# Patient Record
Sex: Male | Born: 1960
Health system: Southern US, Community
[De-identification: ages and names within clinical notes are randomized; demographics above are authoritative.]

## PROBLEM LIST (undated history)

## (undated) DIAGNOSIS — F329 Major depressive disorder, single episode, unspecified: Secondary | ICD-10-CM

## (undated) DIAGNOSIS — K219 Gastro-esophageal reflux disease without esophagitis: Secondary | ICD-10-CM

## (undated) DIAGNOSIS — I4819 Other persistent atrial fibrillation: Secondary | ICD-10-CM

## (undated) DIAGNOSIS — G4733 Obstructive sleep apnea (adult) (pediatric): Secondary | ICD-10-CM

## (undated) DIAGNOSIS — M1990 Unspecified osteoarthritis, unspecified site: Secondary | ICD-10-CM

## (undated) DIAGNOSIS — M1710 Unilateral primary osteoarthritis, unspecified knee: Secondary | ICD-10-CM

## (undated) DIAGNOSIS — F32A Depression, unspecified: Secondary | ICD-10-CM

## (undated) DIAGNOSIS — I440 Atrioventricular block, first degree: Secondary | ICD-10-CM

## (undated) DIAGNOSIS — M25561 Pain in right knee: Secondary | ICD-10-CM

## (undated) DIAGNOSIS — M179 Osteoarthritis of knee, unspecified: Secondary | ICD-10-CM

## (undated) DIAGNOSIS — M25562 Pain in left knee: Secondary | ICD-10-CM

## (undated) DIAGNOSIS — I10 Essential (primary) hypertension: Secondary | ICD-10-CM

## (undated) HISTORY — DX: Unspecified osteoarthritis, unspecified site: M19.90

## (undated) HISTORY — PX: TONSILLECTOMY: SUR1361

## (undated) HISTORY — DX: Other persistent atrial fibrillation: I48.19

## (undated) HISTORY — PX: CARDIOVASCULAR STRESS TEST: SHX262

## (undated) HISTORY — DX: Major depressive disorder, single episode, unspecified: F32.9

## (undated) HISTORY — PX: APPENDECTOMY: SHX54

## (undated) HISTORY — DX: Depression, unspecified: F32.A

## (undated) HISTORY — DX: Obstructive sleep apnea (adult) (pediatric): G47.33

## (undated) HISTORY — PX: OTHER SURGICAL HISTORY: SHX169

---

## 1999-05-12 ENCOUNTER — Other Ambulatory Visit: Admission: RE | Admit: 1999-05-12 | Discharge: 1999-05-12 | Payer: Self-pay | Admitting: Gastroenterology

## 1999-05-12 ENCOUNTER — Encounter (INDEPENDENT_AMBULATORY_CARE_PROVIDER_SITE_OTHER): Payer: Self-pay | Admitting: Specialist

## 2001-01-16 ENCOUNTER — Other Ambulatory Visit: Admission: RE | Admit: 2001-01-16 | Discharge: 2001-01-16 | Payer: Self-pay | Admitting: General Surgery

## 2001-02-04 ENCOUNTER — Other Ambulatory Visit: Admission: RE | Admit: 2001-02-04 | Discharge: 2001-02-04 | Payer: Self-pay | Admitting: General Surgery

## 2001-04-24 ENCOUNTER — Encounter: Payer: Self-pay | Admitting: Family Medicine

## 2001-04-24 ENCOUNTER — Ambulatory Visit (HOSPITAL_COMMUNITY): Admission: RE | Admit: 2001-04-24 | Discharge: 2001-04-24 | Payer: Self-pay | Admitting: Family Medicine

## 2001-05-22 ENCOUNTER — Emergency Department (HOSPITAL_COMMUNITY): Admission: EM | Admit: 2001-05-22 | Discharge: 2001-05-23 | Payer: Self-pay | Admitting: Emergency Medicine

## 2001-05-28 ENCOUNTER — Other Ambulatory Visit: Admission: RE | Admit: 2001-05-28 | Discharge: 2001-05-28 | Payer: Self-pay | Admitting: General Surgery

## 2002-11-18 ENCOUNTER — Ambulatory Visit (HOSPITAL_COMMUNITY): Admission: RE | Admit: 2002-11-18 | Discharge: 2002-11-18 | Payer: Self-pay | Admitting: Family Medicine

## 2002-11-18 ENCOUNTER — Encounter: Payer: Self-pay | Admitting: Family Medicine

## 2002-11-25 ENCOUNTER — Ambulatory Visit (HOSPITAL_COMMUNITY): Admission: RE | Admit: 2002-11-25 | Discharge: 2002-11-25 | Payer: Self-pay | Admitting: Family Medicine

## 2002-11-25 ENCOUNTER — Encounter: Payer: Self-pay | Admitting: Family Medicine

## 2002-12-15 ENCOUNTER — Ambulatory Visit (HOSPITAL_COMMUNITY): Admission: RE | Admit: 2002-12-15 | Discharge: 2002-12-15 | Payer: Self-pay | Admitting: Internal Medicine

## 2002-12-15 ENCOUNTER — Encounter: Payer: Self-pay | Admitting: Internal Medicine

## 2002-12-29 ENCOUNTER — Ambulatory Visit (HOSPITAL_COMMUNITY): Admission: RE | Admit: 2002-12-29 | Discharge: 2002-12-29 | Payer: Self-pay | Admitting: Otolaryngology

## 2002-12-29 ENCOUNTER — Encounter: Payer: Self-pay | Admitting: Otolaryngology

## 2004-11-17 ENCOUNTER — Ambulatory Visit (HOSPITAL_COMMUNITY): Admission: RE | Admit: 2004-11-17 | Discharge: 2004-11-17 | Payer: Self-pay | Admitting: Family Medicine

## 2005-02-28 ENCOUNTER — Ambulatory Visit: Payer: Self-pay | Admitting: Orthopedic Surgery

## 2005-10-22 ENCOUNTER — Ambulatory Visit: Payer: Self-pay | Admitting: Orthopedic Surgery

## 2005-11-28 ENCOUNTER — Emergency Department (HOSPITAL_COMMUNITY): Admission: EM | Admit: 2005-11-28 | Discharge: 2005-11-29 | Payer: Self-pay | Admitting: Emergency Medicine

## 2006-09-22 ENCOUNTER — Emergency Department (HOSPITAL_COMMUNITY): Admission: EM | Admit: 2006-09-22 | Discharge: 2006-09-22 | Payer: Self-pay | Admitting: Emergency Medicine

## 2006-10-27 ENCOUNTER — Emergency Department (HOSPITAL_COMMUNITY): Admission: EM | Admit: 2006-10-27 | Discharge: 2006-10-27 | Payer: Self-pay | Admitting: Emergency Medicine

## 2007-12-08 ENCOUNTER — Emergency Department (HOSPITAL_COMMUNITY): Admission: EM | Admit: 2007-12-08 | Discharge: 2007-12-08 | Payer: Self-pay | Admitting: Emergency Medicine

## 2009-03-16 ENCOUNTER — Emergency Department (HOSPITAL_COMMUNITY): Admission: EM | Admit: 2009-03-16 | Discharge: 2009-03-16 | Payer: Self-pay | Admitting: Emergency Medicine

## 2009-03-18 ENCOUNTER — Ambulatory Visit (HOSPITAL_COMMUNITY): Admission: RE | Admit: 2009-03-18 | Discharge: 2009-03-18 | Payer: Self-pay | Admitting: Family Medicine

## 2009-04-26 ENCOUNTER — Encounter: Admission: RE | Admit: 2009-04-26 | Discharge: 2009-07-25 | Payer: Self-pay | Admitting: Neurology

## 2010-07-04 LAB — POCT CARDIAC MARKERS
Myoglobin, poc: 62.5 ng/mL (ref 12–200)
Troponin i, poc: <0.05 ng/mL (ref 0.00–0.09)
Troponin i, poc: <0.05 ng/mL (ref 0.00–0.09)

## 2010-07-04 LAB — BASIC METABOLIC PANEL
BUN: 10 mg/dL (ref 6–23)
Calcium: 9.1 mg/dL (ref 8.4–10.5)
Creatinine, Ser: 1.03 mg/dL (ref 0.4–1.5)
GFR calc non Af Amer: >60 mL/min (ref >60)
Glucose, Bld: 87 mg/dL (ref 70–99)
Potassium: 3.9 mEq/L (ref 3.5–5.1)

## 2010-07-04 LAB — CBC
Hemoglobin: 15.9 g/dL (ref 13.0–17.0)
MCHC: 34.4 g/dL (ref 30.0–36.0)
Platelets: 237 10*3/uL (ref 150–400)
RDW: 13 % (ref 11.5–15.5)

## 2010-07-04 LAB — DIFFERENTIAL
Basophils Absolute: 0.1 10*3/uL (ref 0.0–0.1)
Basophils Relative: 2 % — ABNORMAL HIGH (ref 0–1)
Monocytes Absolute: 0.7 10*3/uL (ref 0.1–1.0)
Neutro Abs: 5.8 10*3/uL (ref 1.7–7.7)

## 2011-02-06 ENCOUNTER — Encounter: Payer: Self-pay | Admitting: Gastroenterology

## 2011-08-31 ENCOUNTER — Telehealth: Payer: Self-pay

## 2011-08-31 NOTE — Telephone Encounter (Signed)
Called pt. He is not having any rectal bleeding, and no GI issues at this time. No family hx of colon cancer. He wants to wait for a little while and check out his insurance and see how much he will have to pay, etc. He said he will call when ready, he will definitely put this on his to do list.

## 2011-10-30 ENCOUNTER — Telehealth: Payer: Self-pay

## 2011-10-30 NOTE — Telephone Encounter (Signed)
Called pt to see if he is ready to schedule his colonoscopy. ( he had told me in May 2013 that he would call when he was ready to schedule. [facility] called this morning and spoke with Dawn. So I called the pt and he still says he is not ready yet, but he will call when he is ready. I am faxing over a letter to his PCP.

## 2012-01-02 ENCOUNTER — Encounter: Payer: Self-pay | Admitting: Gastroenterology

## 2014-01-09 ENCOUNTER — Emergency Department (HOSPITAL_COMMUNITY): Payer: BC Managed Care – PPO

## 2014-01-09 ENCOUNTER — Inpatient Hospital Stay (HOSPITAL_COMMUNITY)
Admission: EM | Admit: 2014-01-09 | Discharge: 2014-01-11 | DRG: 310 | Disposition: A | Discharge status: Home / Self Care | Payer: BC Managed Care – PPO | Attending: Internal Medicine | Admitting: Internal Medicine

## 2014-01-09 ENCOUNTER — Encounter (HOSPITAL_COMMUNITY): Payer: Self-pay | Admitting: Emergency Medicine

## 2014-01-09 DIAGNOSIS — Z23 Encounter for immunization: Secondary | ICD-10-CM | POA: Diagnosis not present

## 2014-01-09 DIAGNOSIS — R072 Precordial pain: Secondary | ICD-10-CM

## 2014-01-09 DIAGNOSIS — G4733 Obstructive sleep apnea (adult) (pediatric): Secondary | ICD-10-CM | POA: Diagnosis present

## 2014-01-09 DIAGNOSIS — Z6837 Body mass index (BMI) 37.0-37.9, adult: Secondary | ICD-10-CM

## 2014-01-09 DIAGNOSIS — E669 Obesity, unspecified: Secondary | ICD-10-CM | POA: Diagnosis present

## 2014-01-09 DIAGNOSIS — F17210 Nicotine dependence, cigarettes, uncomplicated: Secondary | ICD-10-CM | POA: Diagnosis present

## 2014-01-09 DIAGNOSIS — Z9119 Patient's noncompliance with other medical treatment and regimen: Secondary | ICD-10-CM | POA: Diagnosis present

## 2014-01-09 DIAGNOSIS — I4891 Unspecified atrial fibrillation: Secondary | ICD-10-CM | POA: Diagnosis present

## 2014-01-09 DIAGNOSIS — I4892 Unspecified atrial flutter: Secondary | ICD-10-CM | POA: Diagnosis present

## 2014-01-09 DIAGNOSIS — R079 Chest pain, unspecified: Secondary | ICD-10-CM | POA: Diagnosis present

## 2014-01-09 DIAGNOSIS — K219 Gastro-esophageal reflux disease without esophagitis: Secondary | ICD-10-CM | POA: Diagnosis present

## 2014-01-09 DIAGNOSIS — Z832 Family history of diseases of the blood and blood-forming organs and certain disorders involving the immune mechanism: Secondary | ICD-10-CM

## 2014-01-09 DIAGNOSIS — I10 Essential (primary) hypertension: Secondary | ICD-10-CM | POA: Diagnosis present

## 2014-01-09 DIAGNOSIS — Z716 Tobacco abuse counseling: Secondary | ICD-10-CM

## 2014-01-09 DIAGNOSIS — Z9114 Patient's other noncompliance with medication regimen: Secondary | ICD-10-CM

## 2014-01-09 DIAGNOSIS — Z7182 Exercise counseling: Secondary | ICD-10-CM

## 2014-01-09 DIAGNOSIS — Z713 Dietary counseling and surveillance: Secondary | ICD-10-CM

## 2014-01-09 HISTORY — DX: Essential (primary) hypertension: I10

## 2014-01-09 LAB — CBC
HCT: 54.3 % — ABNORMAL HIGH (ref 39.0–52.0)
Hemoglobin: 19 g/dL — ABNORMAL HIGH (ref 13.0–17.0)
MCH: 30.5 pg (ref 26.0–34.0)
MCHC: 35 g/dL (ref 30.0–36.0)
MCV: 87.2 fL (ref 78.0–100.0)
PLATELETS: 273 10*3/uL (ref 150–400)
RBC: 6.23 MIL/uL — ABNORMAL HIGH (ref 4.22–5.81)
RDW: 14.3 % (ref 11.5–15.5)
WBC: 9.4 10*3/uL (ref 4.0–10.5)

## 2014-01-09 LAB — TROPONIN I
Troponin I: <0.3 ng/mL (ref <0.30)
Troponin I: <0.3 ng/mL (ref <0.30)

## 2014-01-09 LAB — BASIC METABOLIC PANEL
ANION GAP: 16 — AB (ref 5–15)
BUN: 10 mg/dL (ref 6–23)
CALCIUM: 9.5 mg/dL (ref 8.4–10.5)
CHLORIDE: 99 meq/L (ref 96–112)
CO2: 25 mEq/L (ref 19–32)
CREATININE: 0.87 mg/dL (ref 0.50–1.35)
Glucose, Bld: 89 mg/dL (ref 70–99)
Potassium: 4.1 mEq/L (ref 3.7–5.3)
Sodium: 140 mEq/L (ref 137–147)

## 2014-01-09 LAB — HEPATIC FUNCTION PANEL
ALK PHOS: 79 U/L (ref 39–117)
ALT: 24 U/L (ref 0–53)
AST: 17 U/L (ref 0–37)
Albumin: 4.5 g/dL (ref 3.5–5.2)
BILIRUBIN TOTAL: 0.6 mg/dL (ref 0.3–1.2)
Total Protein: 8.3 g/dL (ref 6.0–8.3)

## 2014-01-09 LAB — MAGNESIUM: MAGNESIUM: 2 mg/dL (ref 1.5–2.5)

## 2014-01-09 MED ORDER — RIVAROXABAN 20 MG PO TABS
20.0000 mg | ORAL_TABLET | Freq: Every day | ORAL | Status: DC
  Administered 2014-01-09 – 2014-01-10 (×2): 20 mg via ORAL
  Filled 2014-01-09 (×2): qty 1

## 2014-01-09 MED ORDER — ONDANSETRON HCL 4 MG PO TABS: 4.0000 mg | ORAL_TABLET | Freq: Four times a day (QID) | ORAL | Status: DC | PRN

## 2014-01-09 MED ORDER — INFLUENZA VAC SPLIT QUAD 0.5 ML IM SUSY
0.5000 mL | PREFILLED_SYRINGE | INTRAMUSCULAR | Status: DC
  Filled 2014-01-09: qty 0.5

## 2014-01-09 MED ORDER — LORAZEPAM 2 MG/ML IJ SOLN
INTRAMUSCULAR | Status: AC
  Administered 2014-01-09: 1 mg via INTRAVENOUS
  Filled 2014-01-09: qty 1

## 2014-01-09 MED ORDER — SODIUM CHLORIDE 0.9 % IJ SOLN
3.0000 mL | Freq: Two times a day (BID) | INTRAMUSCULAR | Status: DC
  Administered 2014-01-09 – 2014-01-11 (×4): 3 mL via INTRAVENOUS

## 2014-01-09 MED ORDER — HYDROCODONE-ACETAMINOPHEN 5-325 MG PO TABS: 1.0000 | ORAL_TABLET | ORAL | Status: DC | PRN

## 2014-01-09 MED ORDER — ACETAMINOPHEN 325 MG PO TABS
650.0000 mg | ORAL_TABLET | Freq: Four times a day (QID) | ORAL | Status: DC | PRN
  Administered 2014-01-09 – 2014-01-10 (×2): 650 mg via ORAL
  Filled 2014-01-09 (×2): qty 2

## 2014-01-09 MED ORDER — ONDANSETRON HCL 4 MG/2ML IJ SOLN: 4.0000 mg | Freq: Four times a day (QID) | INTRAMUSCULAR | Status: DC | PRN

## 2014-01-09 MED ORDER — SODIUM CHLORIDE 0.9 % IV SOLN: 250.0000 mL | INTRAVENOUS | Status: DC | PRN

## 2014-01-09 MED ORDER — NICOTINE 21 MG/24HR TD PT24
21.0000 mg | MEDICATED_PATCH | Freq: Once | TRANSDERMAL | Status: AC
Start: 2014-01-09 — End: 2014-01-10
  Administered 2014-01-09: 21 mg via TRANSDERMAL
  Filled 2014-01-09: qty 1

## 2014-01-09 MED ORDER — LORAZEPAM 2 MG/ML IJ SOLN
1.0000 mg | Freq: Once | INTRAMUSCULAR | Status: AC
  Administered 2014-01-09: 1 mg via INTRAVENOUS

## 2014-01-09 MED ORDER — PNEUMOCOCCAL VAC POLYVALENT 25 MCG/0.5ML IJ INJ
0.5000 mL | INJECTION | INTRAMUSCULAR | Status: DC
  Filled 2014-01-09: qty 0.5

## 2014-01-09 MED ORDER — SODIUM CHLORIDE 0.9 % IJ SOLN
3.0000 mL | Freq: Two times a day (BID) | INTRAMUSCULAR | Status: DC
  Administered 2014-01-09: 3 mL via INTRAVENOUS

## 2014-01-09 MED ORDER — METOPROLOL TARTRATE 25 MG PO TABS
25.0000 mg | ORAL_TABLET | Freq: Two times a day (BID) | ORAL | Status: DC
  Administered 2014-01-09 – 2014-01-11 (×4): 25 mg via ORAL
  Filled 2014-01-09 (×5): qty 1

## 2014-01-09 MED ORDER — SENNOSIDES-DOCUSATE SODIUM 8.6-50 MG PO TABS: 1.0000 | ORAL_TABLET | Freq: Every evening | ORAL | Status: DC | PRN

## 2014-01-09 MED ORDER — ACETAMINOPHEN 650 MG RE SUPP: 650.0000 mg | Freq: Four times a day (QID) | RECTAL | Status: DC | PRN

## 2014-01-09 MED ORDER — SODIUM CHLORIDE 0.9 % IJ SOLN: 3.0000 mL | INTRAMUSCULAR | Status: DC | PRN

## 2014-01-09 NOTE — ED Notes (Signed)
EDP at bedside  

## 2014-01-09 NOTE — ED Notes (Signed)
Pt with mid CP at 1100 pressure per pt with dizziness while cleaning cat litter boxes, with sob as well

## 2014-01-09 NOTE — H&P (Signed)
[facility]          History and Physical    PCP:   No PCP Per Patient   Chief Complaint:  Dizziness, chest pain  HPI: Patient is a pleasant 53 year old man with past medical history significant for hypertension and GERD. He states that for the past 3 days he has been experiencing dizziness and palpitations. This morning at about 10:00 he also experienced some central chest tightness and numbness on both hands that prompted him to seek medical attention. In the emergency department an EKG shows atrial flutter which is of new onset. Interestingly, patient states that both his father and his brother died from "blood clots". We have been asked to admit him for further evaluation and management.  Allergies:   Allergies  Allergen Reactions  . Penicillins   . Sulfa Antibiotics   . Tetracyclines & Related       Past Medical History  Diagnosis Date  . Hypertension   . Reflux     Past Surgical History  Procedure Laterality Date  . Appendectomy    . Knee surgety      Prior to Admission medications   Medication Sig Start Date End Date Taking? Authorizing Provider  amLODipine (NORVASC) 10 MG tablet Take 1 tablet by mouth daily. 12/26/13  Yes Historical Provider, MD  bisoprolol-hydrochlorothiazide (ZIAC) 10-6.25 MG per tablet Take 1 tablet by mouth daily. 12/26/13  Yes Historical Provider, MD  omeprazole (PRILOSEC) 40 MG capsule Take 1 capsule by mouth daily. 12/26/13  Yes Historical Provider, MD    Social History:  reports that he has been smoking Cigarettes.  He has been smoking about 0.00 packs per day. He does not have any smokeless tobacco history on file. He reports that he drinks alcohol. He reports that he does not use illicit drugs.  History reviewed. No pertinent family history.  Review of Systems:  Constitutional: Denies fever, chills, diaphoresis, appetite change and fatigue.  HEENT: Denies photophobia, eye pain, redness, hearing loss, ear pain,  congestion, sore throat, rhinorrhea, sneezing, mouth sores, trouble swallowing, neck pain, neck stiffness and tinnitus.   Respiratory: Denies SOB, DOE, cough,   and wheezing.   Cardiovascular: Denies leg swelling.  Gastrointestinal: Denies nausea, vomiting, abdominal pain, diarrhea, constipation, blood in stool and abdominal distention.  Genitourinary: Denies dysuria, urgency, frequency, hematuria, flank pain and difficulty urinating.  Endocrine: Denies: hot or cold intolerance, sweats, changes in hair or nails, polyuria, polydipsia. Musculoskeletal: Denies myalgias, back pain, joint swelling, arthralgias and gait problem.  Skin: Denies pallor, rash and wound.  Neurological: Denies dizziness, seizures, syncope, weakness, light-headedness, numbness and headaches.  Hematological: Denies adenopathy. Easy bruising, personal or family bleeding history  Psychiatric/Behavioral: Denies suicidal ideation, mood changes, confusion, nervousness, sleep disturbance and agitation   Physical Exam: Blood pressure 140/82, pulse 61, temperature 98.4 F (36.9 C), temperature source Oral, resp. rate 20, height 5' 11.5" (1.816 m), weight 124.739 kg (275 lb), SpO2 98.00%. General: Alert, awake, oriented x3, in no current distress HEENT: Normocephalic, atraumatic, pupils equal and react to light, extraocular movements intact Neck: Supple, no JVD, no lymphadenopathy, no bruits, no goiter. Cardiovascular: Regular, no murmurs, rubs or gallops Lungs: Clear to auscultation bilaterally Abdomen: Soft, nontender, nondistended, positive bowel sounds, no masses organomegaly noted. Present extremities: No clubbing, cyanosis or edema, positive pedal pulses. Neurologic: Grossly intact and nonfocal  Labs on Admission:  Results for orders placed during the hospital encounter of  01/09/14 (from the past 48 hour(s))  CBC     Status: Abnormal   Collection Time    01/09/14 12:20 PM      Result Value Ref Range   WBC 9.4  4.0 -  10.5 K/uL   RBC 6.23 (*) 4.22 - 5.81 MIL/uL   Hemoglobin 19.0 (*) 13.0 - 17.0 g/dL   HCT 54.3 (*) 39.0 - 52.0 %   MCV 87.2  78.0 - 100.0 fL   MCH 30.5  26.0 - 34.0 pg   MCHC 35.0  30.0 - 36.0 g/dL   RDW 14.3  11.5 - 15.5 %   Platelets 273  150 - 400 K/uL  BASIC METABOLIC PANEL     Status: Abnormal   Collection Time    01/09/14 12:20 PM      Result Value Ref Range   Sodium 140  137 - 147 mEq/L   Potassium 4.1  3.7 - 5.3 mEq/L   Chloride 99  96 - 112 mEq/L   CO2 25  19 - 32 mEq/L   Glucose, Bld 89  70 - 99 mg/dL   BUN 10  6 - 23 mg/dL   Creatinine, Ser 0.87  0.50 - 1.35 mg/dL   Calcium 9.5  8.4 - 10.5 mg/dL   GFR calc non Af Amer >90  >90 mL/min   GFR calc Af Amer >90  >90 mL/min   Comment: (NOTE)     The eGFR has been calculated using the CKD EPI equation.     This calculation has not been validated in all clinical situations.     eGFR's persistently <90 mL/min signify possible Chronic Kidney     Disease.   Anion gap 16 (*) 5 - 15  TROPONIN I     Status: None   Collection Time    01/09/14 12:20 PM      Result Value Ref Range   Troponin I <0.30  <0.30 ng/mL   Comment:            Due to the release kinetics of cTnI,     a negative result within the first hours     of the onset of symptoms does not rule out     myocardial infarction with certainty.     If myocardial infarction is still suspected,     repeat the test at appropriate intervals.  HEPATIC FUNCTION PANEL     Status: None   Collection Time    01/09/14 12:20 PM      Result Value Ref Range   Total Protein 8.3  6.0 - 8.3 g/dL   Albumin 4.5  3.5 - 5.2 g/dL   AST 17  0 - 37 U/L   ALT 24  0 - 53 U/L   Alkaline Phosphatase 79  39 - 117 U/L   Total Bilirubin 0.6  0.3 - 1.2 mg/dL   Bilirubin, Direct <0.2  0.0 - 0.3 mg/dL   Indirect Bilirubin NOT CALCULATED  0.3 - 0.9 mg/dL    Radiological Exams on Admission: Dg Chest Port 1 View  01/09/2014   CLINICAL DATA:  Acute onset chest pain  EXAM: PORTABLE CHEST - 1 VIEW   COMPARISON:  March 16, 2009  FINDINGS: The lungs are clear. Heart size and pulmonary vascularity are normal. No adenopathy. No pneumothorax. No bone lesions.  IMPRESSION: No edema or consolidation.   Electronically Signed   By: Lowella Grip M.D.   On: 01/09/2014 12:44    Assessment/Plan Principal Problem:  Chest pain Active Problems:   New onset atrial flutter   HTN (hypertension)   GERD (gastroesophageal reflux disease)    Chest pain/dizziness -Likely secondary to new onset A. fib flutter. -Please see below for details.  New onset atrial flutter/fibrillation -Rate is currently controlled in the 63s. -Check 2-D echo, cycle troponins, check TSH, check electrolytes. -He has a CHADSVASC score of 2 in which oral anticoagulation is highly recommended, as such we'll start him on xarelto. -Will request cardiology consultation on Monday; question whether cardioversion might be feasible in this gentleman.  Hypertension -Will stop his hydrochlorothiazide and Norvasc to allow room for metoprolol which will assist with rate control of his a flutter in case it is needed.  DVT prophylaxis -On xarelto  CODE STATUS -Full code   Time Spent on Admission: 75 minutes  HERNANDEZ ACOSTA,ESTELA [facility] Pager: [phone] 01/09/2014, 6:13 PM

## 2014-01-09 NOTE — ED Notes (Signed)
After EDP assessment, EDP gave verbal order to admin 1mg  of ativan and repeat EKG approximately 15 minutes after ativan admin.

## 2014-01-09 NOTE — ED Provider Notes (Signed)
CSN: [id number]     Arrival date & time 01/09/14  1205 History   First MD Initiated Contact with Patient 01/09/14 1208     Chief Complaint  Patient presents with  . Chest Pain     (Consider location/radiation/quality/duration/timing/severity/associated sxs/prior Treatment) Patient is a 53 y.o. male presenting with chest pain. The history is provided by the patient (the pt complains of chest pain today with some dizziness).  Chest Pain Pain location:  L chest Pain quality: aching   Pain radiates to:  Does not radiate Pain radiates to the back: no   Pain severity:  Moderate Onset quality:  Sudden Timing:  Constant Progression:  Unchanged Associated symptoms: no abdominal pain, no back pain, no cough, no fatigue and no headache     Past Medical History  Diagnosis Date  . Hypertension   . Reflux    Past Surgical History  Procedure Laterality Date  . Appendectomy    . Knee surgety     History reviewed. No pertinent family history. History  Substance Use Topics  . Smoking status: Current Every Day Smoker    Types: Cigarettes  . Smokeless tobacco: Not on file  . Alcohol Use: Yes     Comment: rarely    Review of Systems  Constitutional: Negative for appetite change and fatigue.  HENT: Negative for congestion, ear discharge and sinus pressure.   Eyes: Negative for discharge.  Respiratory: Negative for cough.   Cardiovascular: Positive for chest pain.  Gastrointestinal: Negative for abdominal pain and diarrhea.  Genitourinary: Negative for frequency and hematuria.  Musculoskeletal: Negative for back pain.  Skin: Negative for rash.  Neurological: Positive for light-headedness. Negative for seizures and headaches.  Psychiatric/Behavioral: Negative for hallucinations.      Allergies  Penicillins; Sulfa antibiotics; and Tetracyclines & related  Home Medications   Prior to Admission medications   Medication Sig Start Date End Date Taking? Authorizing Provider   amLODipine (NORVASC) 10 MG tablet Take 1 tablet by mouth daily. 12/26/13  Yes Historical Provider, MD  bisoprolol-hydrochlorothiazide (ZIAC) 10-6.25 MG per tablet Take 1 tablet by mouth daily. 12/26/13  Yes Historical Provider, MD  omeprazole (PRILOSEC) 40 MG capsule Take 1 capsule by mouth daily. 12/26/13  Yes Historical Provider, MD   BP 120/93  Pulse 73  Temp(Src) 97.7 F (36.5 C) (Oral)  Resp 19  Ht 5' 11.5" (1.816 m)  Wt 275 lb (124.739 kg)  BMI 37.82 kg/m2  SpO2 95% Physical Exam  Constitutional: He is oriented to person, place, and time. He appears well-developed.  HENT:  Head: Normocephalic.  Eyes: Conjunctivae and EOM are normal. No scleral icterus.  Neck: Neck supple. No thyromegaly present.  Cardiovascular: Normal rate.  Exam reveals no gallop and no friction rub.   No murmur heard. Irregular heart rate  Pulmonary/Chest: No stridor. He has no wheezes. He has no rales. He exhibits no tenderness.  Abdominal: He exhibits no distension. There is no tenderness. There is no rebound.  Musculoskeletal: Normal range of motion. He exhibits no edema.  Lymphadenopathy:    He has no cervical adenopathy.  Neurological: He is oriented to person, place, and time. He exhibits normal muscle tone. Coordination normal.  Skin: No rash noted. No erythema.  Psychiatric: He has a normal mood and affect. His behavior is normal.    ED Course  Procedures (including critical care time) Labs Review Labs Reviewed  CBC - Abnormal; Notable for the following:    RBC 6.23 (*)    Hemoglobin 19.0 (*)  HCT 54.3 (*)    All other components within normal limits  BASIC METABOLIC PANEL - Abnormal; Notable for the following:    Anion gap 16 (*)    All other components within normal limits  TROPONIN I  HEPATIC FUNCTION PANEL    Imaging Review Dg Chest Port 1 View  01/09/2014   CLINICAL DATA:  Acute onset chest pain  EXAM: PORTABLE CHEST - 1 VIEW  COMPARISON:  March 16, 2009  FINDINGS: The  lungs are clear. Heart size and pulmonary vascularity are normal. No adenopathy. No pneumothorax. No bone lesions.  IMPRESSION: No edema or consolidation.   Electronically Signed   By: Lowella Grip M.D.   On: 01/09/2014 12:44     EKG Interpretation   Date/Time:  Saturday January 09 2014 12:58:06 EDT Ventricular Rate:  75 PR Interval:    QRS Duration: 98 QT Interval:  405 QTC Calculation: 452 R Axis:   10 Text Interpretation:  Atrial flutter/fibrillation Low voltage, precordial  leads Baseline wander in lead(s) V6 Confirmed by Annick Dimaio  MD, Simrin Vegh  [phone]) on 01/09/2014 3:03:49 PM      MDM   Final diagnoses:  Chest pain at rest        Maudry Diego, MD 01/09/14 1505

## 2014-01-09 NOTE — Progress Notes (Signed)
ANTICOAGULATION CONSULT NOTE - Initial Consult  Pharmacy Consult for Xarelto Indication: atrial fibrillation  Allergies  Allergen Reactions  . Penicillins   . Sulfa Antibiotics   . Tetracyclines & Related     Patient Measurements: Height: 5' 11.5" (181.6 cm) Weight: 275 lb (124.739 kg) IBW/kg (Calculated) : 76.45  Vital Signs: Temp: 98.4 F (36.9 C) (10/10 1624) Temp Source: Oral (10/10 1213) BP: 140/82 mmHg (10/10 1624) Pulse Rate: 61 (10/10 1624)  Labs:  Recent Labs  01/09/14 1220  HGB 19.0*  HCT 54.3*  PLT 273  CREATININE 0.87  TROPONINI <0.30    Estimated Creatinine Clearance: 133.1 ml/min (by C-G formula based on Cr of 0.87).   Medical History: Past Medical History  Diagnosis Date  . Hypertension   . Reflux     Medications:  Scheduled:  . metoprolol tartrate  25 mg Oral BID  . sodium chloride  3 mL Intravenous Q12H  . sodium chloride  3 mL Intravenous Q12H    Assessment: 23 yoM with new onset Afib.  Family hx of blood clots.   CBC reviewed.  No bleeding noted.   Goal of Therapy:  Stroke prevention   Plan:  Xarelto 20mg  po daily with food Monitor CBC Provide patient education  Biagio Borg 01/09/2014,6:59 PM

## 2014-01-10 DIAGNOSIS — I4892 Unspecified atrial flutter: Secondary | ICD-10-CM

## 2014-01-10 LAB — HEMOGLOBIN A1C
Hgb A1c MFr Bld: 5.6 % (ref <5.7)
MEAN PLASMA GLUCOSE: 114 mg/dL (ref <117)

## 2014-01-10 LAB — BASIC METABOLIC PANEL
Anion gap: 13 (ref 5–15)
BUN: 10 mg/dL (ref 6–23)
CALCIUM: 8.5 mg/dL (ref 8.4–10.5)
CO2: 26 meq/L (ref 19–32)
Chloride: 102 mEq/L (ref 96–112)
Creatinine, Ser: 0.94 mg/dL (ref 0.50–1.35)
GFR calc Af Amer: >90 mL/min (ref >90)
GFR calc non Af Amer: >90 mL/min (ref >90)
GLUCOSE: 102 mg/dL — AB (ref 70–99)
Potassium: 3.7 mEq/L (ref 3.7–5.3)
SODIUM: 141 meq/L (ref 137–147)

## 2014-01-10 LAB — CBC
HEMATOCRIT: 43.5 % (ref 39.0–52.0)
Hemoglobin: 15 g/dL (ref 13.0–17.0)
MCH: 30.7 pg (ref 26.0–34.0)
MCHC: 34.5 g/dL (ref 30.0–36.0)
MCV: 89 fL (ref 78.0–100.0)
Platelets: 217 10*3/uL (ref 150–400)
RBC: 4.89 MIL/uL (ref 4.22–5.81)
RDW: 13.5 % (ref 11.5–15.5)
WBC: 6.8 10*3/uL (ref 4.0–10.5)

## 2014-01-10 LAB — TROPONIN I: Troponin I: <0.3 ng/mL (ref <0.30)

## 2014-01-10 LAB — TSH: TSH: 2.38 u[IU]/mL (ref 0.350–4.500)

## 2014-01-10 MED ORDER — PANTOPRAZOLE SODIUM 40 MG PO TBEC
40.0000 mg | DELAYED_RELEASE_TABLET | Freq: Every day | ORAL | Status: DC
  Administered 2014-01-10 – 2014-01-11 (×2): 40 mg via ORAL
  Filled 2014-01-10 (×2): qty 1

## 2014-01-10 MED ORDER — NICOTINE 21 MG/24HR TD PT24
21.0000 mg | MEDICATED_PATCH | Freq: Every day | TRANSDERMAL | Status: DC
  Administered 2014-01-10 – 2014-01-11 (×2): 21 mg via TRANSDERMAL
  Filled 2014-01-10 (×2): qty 1

## 2014-01-10 MED ORDER — NICOTINE 21 MG/24HR TD PT24: 21.0000 mg | MEDICATED_PATCH | Freq: Every day | TRANSDERMAL | Status: DC

## 2014-01-10 NOTE — Progress Notes (Signed)
Utilization review Completed Mattea Seger RN BSN   

## 2014-01-10 NOTE — Progress Notes (Signed)
  Echocardiogram 2D Echocardiogram has been performed.  Sean Morales 01/10/2014, 8:47 AM

## 2014-01-10 NOTE — Progress Notes (Signed)
[facility] PROGRESS NOTE  Sean Morales [id number]:[id number] DOB: 09/01/1960 DOA: 01/09/2014 PCP: No PCP Per Patient  Assessment/Plan: Chest pain/dizziness -Secondary to new-onset atrial flutter/flutter. -See below for details.  New-onset atrial flutter/fibrillation -Electrolytes okay, TSH pending. -Has ruled out for MI. -Has been started on metoprolol for rate control and xarelto for anticoagulation given his CHADSVASC score. -2-D echo within normal limits. -Cardiology consult pending for tomorrow morning. Wonder if ablation/cardioversion is an option for him given how symptomatic he is.  Hypertension -Well controlled.  GERD -PPI.   Code Status: Full code Family Communication: Wife at bedside updated on plan of care  Disposition Plan: : Home when ready   Consultants:  Cardiology   Antibiotics:  None   Subjective: No complaints today, has not had any further chest pain or dizziness.  Objective: Filed Vitals:   01/09/14 1624 01/09/14 2250 01/10/14 0652 01/10/14 0917  BP: 140/82 113/73 133/75   Pulse: 61 63 83 75  Temp: 98.4 F (36.9 C) 98.8 F (37.1 C) 98.6 F (37 C)   TempSrc:  Oral Oral   Resp: 20 20 20    Height:      Weight:      SpO2: 98% 94% 96%     Intake/Output Summary (Last 24 hours) at 01/10/14 1522 Last data filed at 01/10/14 0500  Gross per 24 hour  Intake    240 ml  Output    600 ml  Net   -360 ml   Filed Weights   01/09/14 1213  Weight: 124.739 kg (275 lb)    Exam:   General:  Alert, awake, oriented x3, in no current distress  Cardiovascular: Irregular no murmurs  Respiratory: Clear to auscultation bilaterally  Abdomen: Soft, nontender, nondistended positive bowel sounds  Extremities: Trace bilateral pitting edema   Neurologic:  Intact and nonfocal  Data Reviewed: Basic Metabolic Panel:  Recent Labs Lab 01/09/14 1220 01/09/14 1828 01/10/14 0559  NA 140  --  141  K 4.1  --  3.7  CL 99  --  102  CO2 25   --  26  GLUCOSE 89  --  102*  BUN 10  --  10  CREATININE 0.87  --  0.94  CALCIUM 9.5  --  8.5  MG  --  2.0  --    Liver Function Tests:  Recent Labs Lab 01/09/14 1220  AST 17  ALT 24  ALKPHOS 79  BILITOT 0.6  PROT 8.3  ALBUMIN 4.5   No results found for this basename: LIPASE, AMYLASE,  in the last 168 hours No results found for this basename: AMMONIA,  in the last 168 hours CBC:  Recent Labs Lab 01/09/14 1220 01/10/14 0559  WBC 9.4 6.8  HGB 19.0* 15.0  HCT 54.3* 43.5  MCV 87.2 89.0  PLT 273 217   Cardiac Enzymes:  Recent Labs Lab 01/09/14 1220 01/09/14 1828 01/09/14 2342 01/10/14 0559  TROPONINI <0.30 <0.30 <0.30 <0.30   BNP (last 3 results) No results found for this basename: PROBNP,  in the last 8760 hours CBG: No results found for this basename: GLUCAP,  in the last 168 hours  No results found for this or any previous visit (from the past 240 hour(s)).   Studies: Dg Chest Port 1 View  01/09/2014   CLINICAL DATA:  Acute onset chest pain  EXAM: PORTABLE CHEST - 1 VIEW  COMPARISON:  March 16, 2009  FINDINGS: The lungs are clear. Heart size and pulmonary vascularity are normal.  No adenopathy. No pneumothorax. No bone lesions.  IMPRESSION: No edema or consolidation.   Electronically Signed   By: Lowella Grip M.D.   On: 01/09/2014 12:44    Scheduled Meds: . metoprolol tartrate  25 mg Oral BID  . nicotine  21 mg Transdermal Once  . pantoprazole  40 mg Oral Daily  . rivaroxaban  20 mg Oral Q supper  . sodium chloride  3 mL Intravenous Q12H  . sodium chloride  3 mL Intravenous Q12H   Continuous Infusions:   Principal Problem:   Chest pain Active Problems:   New onset atrial flutter   HTN (hypertension)   GERD (gastroesophageal reflux disease)    Time spent: 35 minutes. Greater than 50% of this time was spent in direct contact with the patient coordinating care.    Lelon Frohlich  [facility] Pager [phone]  If  7PM-7AM, please contact night-coverage at [email or web address], password [facility] 01/10/2014, 3:22 PM  LOS: 1 day

## 2014-01-11 ENCOUNTER — Encounter (HOSPITAL_COMMUNITY): Payer: Self-pay | Admitting: Adult Health

## 2014-01-11 DIAGNOSIS — Z716 Tobacco abuse counseling: Secondary | ICD-10-CM

## 2014-01-11 DIAGNOSIS — Z832 Family history of diseases of the blood and blood-forming organs and certain disorders involving the immune mechanism: Secondary | ICD-10-CM

## 2014-01-11 DIAGNOSIS — I4891 Unspecified atrial fibrillation: Principal | ICD-10-CM

## 2014-01-11 DIAGNOSIS — Z9114 Patient's other noncompliance with medication regimen: Secondary | ICD-10-CM

## 2014-01-11 DIAGNOSIS — R079 Chest pain, unspecified: Secondary | ICD-10-CM

## 2014-01-11 DIAGNOSIS — I10 Essential (primary) hypertension: Secondary | ICD-10-CM

## 2014-01-11 DIAGNOSIS — K219 Gastro-esophageal reflux disease without esophagitis: Secondary | ICD-10-CM

## 2014-01-11 DIAGNOSIS — Z713 Dietary counseling and surveillance: Secondary | ICD-10-CM

## 2014-01-11 DIAGNOSIS — Z719 Counseling, unspecified: Secondary | ICD-10-CM

## 2014-01-11 DIAGNOSIS — G4733 Obstructive sleep apnea (adult) (pediatric): Secondary | ICD-10-CM

## 2014-01-11 MED ORDER — RIVAROXABAN 20 MG PO TABS: 20.0000 mg | ORAL_TABLET | Freq: Every day | ORAL | Status: DC

## 2014-01-11 MED ORDER — METOPROLOL TARTRATE 25 MG PO TABS: 25.0000 mg | ORAL_TABLET | Freq: Two times a day (BID) | ORAL | Status: DC

## 2014-01-11 NOTE — Discharge Summary (Signed)
Physician Discharge Summary  Sean Morales GXQ:[id number] DOB: 1960-10-08 DOA: 01/09/2014  PCP: No PCP Per Patient  Admit date: 01/09/2014 Discharge date: 01/11/2014  Time spent: 45 minutes  Recommendations for Outpatient Follow-up:  -Will be discharged home today. -Advised to followup with primary care provider and with cardiologist in 2 weeks.   Discharge Diagnoses:  Principal Problem:   Chest pain Active Problems:   New onset atrial flutter   HTN (hypertension)   GERD (gastroesophageal reflux disease)   Discharge Condition: Stable and improved  Filed Weights   01/09/14 1213  Weight: 124.739 kg (275 lb)    History of present illness:  Patient is a pleasant 53 year old man with past medical history significant for hypertension and GERD. He states that for the past 3 days he has been experiencing dizziness and palpitations. This morning at about 10:00 he also experienced some central chest tightness and numbness on both hands that prompted him to seek medical attention. In the emergency department an EKG shows atrial flutter which is of new onset. Interestingly, patient states that both his father and his brother died from "blood clots". We have been asked to admit him for further evaluation and management.   Hospital Course:   Chest pain/dizziness  -Secondary to new-onset atrial flutter/flutter.  -See below for details.  -No further since admission.  New-onset atrial flutter/fibrillation  -Electrolytes okay, TSH within normal limits. -Has ruled out for MI.  -Has been started on metoprolol for rate control and xarelto for anticoagulation given his CHADSVASC score of 1 and family history of ??DVT. This can be further addressed in the outpatient setting. -2-D echo within normal limits.  -Appreciate cardiology recommendations.  Hypertension  -Well controlled.   Obstructive sleep apnea -Has been noncompliant with CPAP. -Have again discussed importance of  compliance with him.  Tobacco abuse -Counseled on cessation.  GERD  -PPI.      Procedures: 2-D echo: Normal LV wall thickness and chamber size with LVEF 55-60%, indeterminate diastolic dysfunction. Upper normal left atrial size. Trivial aortic regurgitation. Unable to assess PASP.     Consultations:  Cardiology  Discharge Instructions  Discharge Instructions   Diet - low sodium heart healthy    Complete by:  As directed      Increase activity slowly    Complete by:  As directed             Medication List    STOP taking these medications       amLODipine 10 MG tablet  Commonly known as:  NORVASC     bisoprolol-hydrochlorothiazide 10-6.25 MG per tablet  Commonly known as:  ZIAC      TAKE these medications       metoprolol tartrate 25 MG tablet  Commonly known as:  LOPRESSOR  Take 1 tablet (25 mg total) by mouth 2 (two) times daily.     omeprazole 40 MG capsule  Commonly known as:  PRILOSEC  Take 1 capsule by mouth daily.     rivaroxaban 20 MG Tabs tablet  Commonly known as:  XARELTO  Take 1 tablet (20 mg total) by mouth daily with supper.       Allergies  Allergen Reactions  . Penicillins   . Sulfa Antibiotics   . Tetracyclines & Related        Follow-up Information   Schedule an appointment as soon as possible for a visit in 2 weeks to follow up. (with your regular physician)  Follow up with [name], MD. Schedule an appointment as soon as possible for a visit in 2 weeks.   Specialty:  Cardiology   Contact information:   [address] [place] [address] [phone]        The results of significant diagnostics from this hospitalization (including imaging, microbiology, ancillary and laboratory) are listed below for reference.    Significant Diagnostic Studies: Dg Chest Port 1 View  01/09/2014   CLINICAL DATA:  Acute onset chest pain  EXAM: PORTABLE CHEST - 1 VIEW  COMPARISON:  March 16, 2009  FINDINGS: The  lungs are clear. Heart size and pulmonary vascularity are normal. No adenopathy. No pneumothorax. No bone lesions.  IMPRESSION: No edema or consolidation.   Electronically Signed   By: Lowella Grip M.D.   On: 01/09/2014 12:44    Microbiology: No results found for this or any previous visit (from the past 240 hour(s)).   Labs: Basic Metabolic Panel:  Recent Labs Lab 01/09/14 1220 01/09/14 1828 01/10/14 0559  NA 140  --  141  K 4.1  --  3.7  CL 99  --  102  CO2 25  --  26  GLUCOSE 89  --  102*  BUN 10  --  10  CREATININE 0.87  --  0.94  CALCIUM 9.5  --  8.5  MG  --  2.0  --    Liver Function Tests:  Recent Labs Lab 01/09/14 1220  AST 17  ALT 24  ALKPHOS 79  BILITOT 0.6  PROT 8.3  ALBUMIN 4.5   No results found for this basename: LIPASE, AMYLASE,  in the last 168 hours No results found for this basename: AMMONIA,  in the last 168 hours CBC:  Recent Labs Lab 01/09/14 1220 01/10/14 0559  WBC 9.4 6.8  HGB 19.0* 15.0  HCT 54.3* 43.5  MCV 87.2 89.0  PLT 273 217   Cardiac Enzymes:  Recent Labs Lab 01/09/14 1220 01/09/14 1828 01/09/14 2342 01/10/14 0559  TROPONINI <0.30 <0.30 <0.30 <0.30   BNP: BNP (last 3 results) No results found for this basename: PROBNP,  in the last 8760 hours CBG: No results found for this basename: GLUCAP,  in the last 168 hours     Signed:  Lelon Frohlich  [facility] Pager: [phone] 01/11/2014, 3:00 PM

## 2014-01-11 NOTE — Consult Note (Signed)
CARDIOLOGY CONSULT NOTE   Patient ID: Sean Morales MRN: [id number] DOB/AGE: January 21, 1961 53 y.o.  Admit Date: 01/09/2014 Referring Physician: PTH Primary Physician: No PCP Per Patient Consulting Cardiologist: Kate Sable MD Primary Cardiologist:  Reason for Consultation: Dizziness, Chest Pain, Atrial flutter   Clinical Summary Sean Morales is a 53 y.o.malewith no prior history of cardiac disease, but has history of hypertension, GERD, and OSA (non-compliant with CPAP) who was in his usual state of health until 3 days ago. He was at work as Merchant [employer] officer and had sudden wave of dizziness and near syncope lasting about 20 sec, while seated. Felt tired the next day. On Sat morning he felt his heart racing and became short of breath while doing household chores. Took a shower and rested in chair to read a book. He continued to feel short of breath, with racing HR. Felt tingling in his arms and hands. Transient sharp pain. Began to feel anxious. Approx 3-4 hours later his son came home, and he asked him to take him to ER.   On arrival to ER BP 131/94, HR 76 O2 Sat 100%, EKG revealed atrial fibrillation. Hgb 19.0/ Hct 54.3. CXR negative for CHF or pneumonia. He was treated with Ativan 1 mg IV and admitted.  Echocardiogram has been completed demonstrating normal EF with LA upper limits of normal. TSH 2.3 WNL. Potasium of 3.7. Troponins negative X 4.   He has been placed on metoprolol 25 mg BID, Xarelto 20 mg daily, He stills feels a little anxious and having some mild dizziness.   Allergies  Allergen Reactions  . Penicillins   . Sulfa Antibiotics   . Tetracyclines & Related     Medications Scheduled Medications: . metoprolol tartrate  25 mg Oral BID  . nicotine  21 mg Transdermal Daily  . pantoprazole  40 mg Oral Daily  . rivaroxaban  20 mg Oral Q supper  . sodium chloride  3 mL Intravenous Q12H  . sodium chloride  3 mL Intravenous Q12H       PRN  Medications: sodium chloride, acetaminophen, acetaminophen, HYDROcodone-acetaminophen, ondansetron (ZOFRAN) IV, ondansetron, senna-docusate, sodium chloride   Past Medical History  Diagnosis Date  . Hypertension   . Reflux     Past Surgical History  Procedure Laterality Date  . Appendectomy    . Knee surgety      Family History  Problem Relation Age of Onset  . Deep vein thrombosis Father     Social History Sean Morales reports that he has been smoking Cigarettes.  He has a 37.5 pack-year smoking history. He does not have any smokeless tobacco history on file. Sean Morales reports that he drinks alcohol.  Review of Systems Otherwise reviewed and negative except as outlined.  Physical Examination Blood pressure 119/68, pulse 70, temperature 98.1 F (36.7 C), temperature source Oral, resp. rate 20, height 5' 11.5" (1.816 m), weight 275 lb (124.739 kg), SpO2 96.00%.  Intake/Output Summary (Last 24 hours) at 01/11/14 0935 Last data filed at 01/11/14 0535  Gross per 24 hour  Intake    480 ml  Output   1875 ml  Net  -1395 ml    Telemetry: Atrial fib rates in the 60's.   GEN: Resting feeling a little anxious HEENT: Conjunctiva and lids normal, oropharynx clear with moist mucosa. Neck: Supple, no elevated JVP or carotid bruits, no thyromegaly. Lungs: Clear to auscultation, nonlabored breathing at rest. Cardiac: Iregular rate and rhythm, no S3 or significant systolic murmur, no  pericardial rub. Abdomen: Soft, nontender, obese,no hepatomegaly, bowel sounds present, no guarding or rebound. Extremities: No pitting edema, distal pulses 2+. Skin: Warm and dry. Musculoskeletal: No kyphosis. Neuropsychiatric: Alert and oriented x3, affect grossly appropriate.  Prior Cardiac Testing/Procedures 1. Echocardiogram: 01/10/2014 Left ventricle: The cavity size was normal. Wall thickness was normal. Systolic function was normal. The estimated ejection fraction was in the range of 55%  to 60%. Wall motion was normal; there were no regional wall motion abnormalities. The study is not technically sufficient to allow evaluation of LV diastolic function. - Aortic valve: Mildly calcified annulus. Trileaflet. There was trivial regurgitation. - Mitral valve: There was trivial regurgitation. Valve area by pressure half-time: 2.08 cm^2. - Left atrium: The atrium was at the upper limits of normal in size. - Right atrium: Central venous pressure (est): 8 mm Hg. - Atrial septum: No defect or patent foramen ovale was identified. - Tricuspid valve: There was physiologic regurgitation. - Pulmonary arteries: Systolic pressure could not be accurately estimated. - Pericardium, extracardiac: There was no pericardial effusion.   Lab Results  Basic Metabolic Panel:  Recent Labs Lab 01/09/14 1220 01/09/14 1828 01/10/14 0559  NA 140  --  141  K 4.1  --  3.7  CL 99  --  102  CO2 25  --  26  GLUCOSE 89  --  102*  BUN 10  --  10  CREATININE 0.87  --  0.94  CALCIUM 9.5  --  8.5  MG  --  2.0  --     Liver Function Tests:  Recent Labs Lab 01/09/14 1220  AST 17  ALT 24  ALKPHOS 79  BILITOT 0.6  PROT 8.3  ALBUMIN 4.5    CBC:  Recent Labs Lab 01/09/14 1220 01/10/14 0559  WBC 9.4 6.8  HGB 19.0* 15.0  HCT 54.3* 43.5  MCV 87.2 89.0  PLT 273 217    Cardiac Enzymes:  Recent Labs Lab 01/09/14 1220 01/09/14 1828 01/09/14 2342 01/10/14 0559  TROPONINI <0.30 <0.30 <0.30 <0.30    Radiology: Dg Chest Port 1 View  01/09/2014   CLINICAL DATA:  Acute onset chest pain  EXAM: PORTABLE CHEST - 1 VIEW  COMPARISON:  March 16, 2009  FINDINGS: The lungs are clear. Heart size and pulmonary vascularity are normal. No adenopathy. No pneumothorax. No bone lesions.  IMPRESSION: No edema or consolidation.   Electronically Signed   By: Lowella Grip M.D.   On: 01/09/2014 12:44     WJX:BJYNWG fib rate of 74 bpm.   Impression and Recommendations  1. New Onset Atrial  fib: Patient became symptomatic approx 3 days ago, with more severe symptoms Saturday with associated feelings of rapid HR, Dyspnea and tingling in arms and hands. TSH is normal. Now on Xarelto and metoprolol with good HR control. CHADS VASC score of 1.   Cardiac markers are normal arguing against ACS as cause of current arrhythmia. He has OSA which is known to precipitate atrial fib. He is non-compliant with use of CPAP. There is equal risk of CVA with PAF and Persistent atrial fib. Agree with starting [place]. Echo demonstrates LA upper limits of normal with normal EF.   2. OSA: States he has had this for over 8 years. Recommend be compliant with CPAP as directed.  3. Hypertension: Review of home mediations reveal that he is on amlodipine and bisoprolol.HCTZ at home. He denies medical non-compliance. Now only on metoprolol. BP well controlled on this only.  4. Ongoing tobacco abuse:  36 yr/pk abuse. Knows he needs to quit.   5. Unknown lipid status: Check status.   6. GERD: On PPI.   7. Obesity: BMI 2.51.Contributing to # 2,  #3, #6. Recommend wt loss and exercise program.  8. Family Hx of Recurrent DVT's: Consider check for hypercoagulable status.    Signed: Phill Myron. Martasia Talamante NP  01/11/2014, 9:35 AM Co-Sign MD

## 2014-01-11 NOTE — Care Management Note (Signed)
    Page 1 of 1   01/11/2014     11:25:21 AM CARE MANAGEMENT NOTE 01/11/2014  Patient:  Sean Morales, Sean Morales   Account Number:  [id number]  Date Initiated:  01/11/2014  Documentation initiated by:  Theophilus Kinds  Subjective/Objective Assessment:   Pt admitted from home with aflutter/a fib. Pt lives with his wife and will return home at discharge. Pt is independent with ADL's. Pt PCP is with the [facility].     Action/Plan:   Xarelto card given to pt. Anticipate discharge home today.   Anticipated DC Date:  01/11/2014   Anticipated DC Plan:  [place]  CM consult      Choice offered to / List presented to:             Status of service:  Completed, signed off Medicare Important Message given?   (If response is "NO", the following Medicare IM given date fields will be blank) Date Medicare IM given:   Medicare IM given by:   Date Additional Medicare IM given:   Additional Medicare IM given by:    Discharge Disposition:  HOME/SELF CARE  Per UR Regulation:    If discussed at Long Length of Stay Meetings, dates discussed:    Comments:  01/11/14 [place], RN BSN CM

## 2014-01-11 NOTE — Progress Notes (Signed)
Nutrition Brief Note  Patient identified on the Malnutrition Screening Tool (MST) Report  Wt Readings from Last 15 Encounters:  01/09/14 275 lb (124.739 kg)    Body mass index is 37.82 kg/(m^2). Patient meets criteria for obesity based on current BMI.   Current diet order is Low sodium/Heart Healthy, patient is consuming approximately 100% of meals at this time. Labs and medications reviewed.   Pt reports UBW of 290 lb. Pt reports losing weight since starting a new job where he walks all day. With the increase of daily physical activity, pt has lost weight steadily in the past year. No changes in appetite.  No nutrition interventions warranted at this time. If nutrition issues arise, please consult RD.   Clayton Bibles, MS, [place], [place] Licensed Dietitian Nutritionist Pager: [phone]

## 2014-01-11 NOTE — Consult Note (Addendum)
The patient was seen and examined, and I agree with the assessment and plan as documented above, with modifications as noted below. Pt admitted with new-onset rapid atrial fibrillation and subsequently started on metoprolol and Xarelto. Has elevated BMI and sleep apnea but has not used CPAP in years. Also h/o HTN and tobacco abuse. Leads a sedentary life and consumes food high in saturated fats and sodium (confirmed with wife, [name], by conference phone call). Father had DVT and passed away suddenly at age 53, grandfather may have also had DVT. He denies ever having had a coagulopathy workup. 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 Echo results as noted above with normal LV systolic function and LA size at upper limits of normal.  Recommendations: I had a long discussion with both the patient and his wife regarding atrial fibrillation and predisposing factors, including elevated BMI with consequent obstructive sleep apnea. I strongly advised him to begin consuming a heart-healthy diet, tobacco cessation, CPAP compliance, and to engage in an exercise regimen. HR currently controlled on metoprolol 25 mg bid. If he follows through with recommendations and given that his LA is not enlarged, he would then stand a reasonably good chance of converting to sinus rhythm on his own. With respect to anticoagulation, CHADSVASC score is only 1 (HTN) thus giving him a 0.6% annual unadjusted ischemic stroke rate. That being said, his father and grandfather apparently both had DVT and the patient has not had a hypercoagulopathy workup, which I recommend be pursued as an outpatient and I can follow through with this. While it is debatable, I think it is reasonable to continue Xarelto, and educated the patient and his wife about potential bleeding complications and its efficacy vs warfarin. I also discussed the possibility of ablation, but this should only be considered once predisposing risk  factors are addressed as noted above. I will schedule follow up with me in the office within the next few weeks. He can be discharged from my standpoint, and I have communicated this with the hospitalist team.

## 2014-01-15 ENCOUNTER — Encounter: Payer: Self-pay | Admitting: Cardiovascular Disease

## 2014-01-25 ENCOUNTER — Ambulatory Visit (INDEPENDENT_AMBULATORY_CARE_PROVIDER_SITE_OTHER): Payer: BC Managed Care – PPO | Admitting: Cardiovascular Disease

## 2014-01-25 ENCOUNTER — Encounter: Payer: Self-pay | Admitting: Cardiovascular Disease

## 2014-01-25 VITALS — BP 148/111 | HR 69 | Ht 71.0 in | Wt 287.8 lb

## 2014-01-25 DIAGNOSIS — G4733 Obstructive sleep apnea (adult) (pediatric): Secondary | ICD-10-CM | POA: Insufficient documentation

## 2014-01-25 DIAGNOSIS — I481 Persistent atrial fibrillation: Secondary | ICD-10-CM

## 2014-01-25 DIAGNOSIS — Z9989 Dependence on other enabling machines and devices: Secondary | ICD-10-CM

## 2014-01-25 DIAGNOSIS — Z720 Tobacco use: Secondary | ICD-10-CM | POA: Insufficient documentation

## 2014-01-25 DIAGNOSIS — I10 Essential (primary) hypertension: Secondary | ICD-10-CM

## 2014-01-25 DIAGNOSIS — Z716 Tobacco abuse counseling: Secondary | ICD-10-CM

## 2014-01-25 DIAGNOSIS — I4819 Other persistent atrial fibrillation: Secondary | ICD-10-CM

## 2014-01-25 MED ORDER — AMLODIPINE BESYLATE 10 MG PO TABS: 5.0000 mg | ORAL_TABLET | Freq: Every day | ORAL | Status: DC

## 2014-01-25 NOTE — Patient Instructions (Signed)
   Begin Amlodipine 5mg  daily - will take 1/2 tab of 10mg  tablet already have at home  Please ask family doctor to send [place] copy of sleep study results when complete. Continue all other medications.  Your physician has requested that you regularly monitor and record your blood pressure readings at home. Please take at varied times of the day over next 2 weeks & return to MD for review.  Follow up in  3 months

## 2014-01-25 NOTE — Progress Notes (Signed)
Patient ID: Sean Morales, male   DOB: Jun 19, 1960, 53 y.o.   MRN: [id number]      SUBJECTIVE: The patient is a 53 year old male whom I recently evaluated at [facility] for new onset rapid atrial fibrillation. He was subsequently started on metoprolol and Xarelto, given his family history which was suspicious for a coagulopathy resulting in DVT in both his father and grandfather. He also has a history of sleep apnea but had not used CPAP in years. He also has a history of hypertension, obesity, and tobacco abuse. He had been leading a sedentary life for many years and has been consuming foods high in saturated fats and sodium, which was confirmed with his wife, [name]. Echocardiogram demonstrated normal left ventricular systolic function with a left atrial size at the upper limits of normal. I discussed the possibility of ablation, but I told him that this should only be considered once predisposing risk factors were addressed.  He has not been feeling well. His blood pressure at home at best has been 140/90 but has been as high as 150/103. He has been unable to find his larger blood pressure cuff. He has been having intermittent shortness of breath and palpitations. He has felt "jumpy" and also felt that his "head was on fire". His ears have felt flushed and he has had tingling in his arms. He is also had an increased urge to urinate. He denies bleeding problems such as hematuria and hematochezia. He has not been using CPAP and a repeat sleep study has been ordered by his primary care provider. Blood pressure today is 148/111, heart rate 69 bpm. Weight is 287 pounds. He has been unable to exercise and said he was told several years ago that he needs bilateral knee replacement surgery.   Review of Systems: As per "subjective", otherwise negative.  Allergies  Allergen Reactions  . Penicillins   . Sulfa Antibiotics   . Tetracyclines & Related     Current Outpatient Prescriptions    Medication Sig Dispense Refill  . BuPROPion HCl (WELLBUTRIN PO) Take by mouth daily.      . metoprolol tartrate (LOPRESSOR) 25 MG tablet Take 1 tablet (25 mg total) by mouth 2 (two) times daily.  60 tablet  2  . omeprazole (PRILOSEC) 40 MG capsule Take 1 capsule by mouth daily.      . rivaroxaban (XARELTO) 20 MG TABS tablet Take 1 tablet (20 mg total) by mouth daily with supper.  30 tablet  1   No current facility-administered medications for this visit.    Past Medical History  Diagnosis Date  . Hypertension   . Reflux     Past Surgical History  Procedure Laterality Date  . Appendectomy    . Knee surgety      History   Social History  . Marital Status: Married    Spouse Name: N/A    Number of Children: N/A  . Years of Education: N/A   Occupational History  . Not on file.   Social History Main Topics  . Smoking status: Current Every Day Smoker -- 0.25 packs/day for 25 years    Types: Cigarettes  . Smokeless tobacco: Not on file  . Alcohol Use: Yes     Comment: rarely  . Drug Use: No  . Sexual Activity: Not on file   Other Topics Concern  . Not on file   Social History Narrative  . No narrative on file     Filed Vitals:  01/25/14 1019  BP: 153/115  Pulse: 64  Height: 5\' 11"  (1.803 m)  Weight: 287 lb 12.8 oz (130.545 kg)  SpO2: 98%    PHYSICAL EXAM General: NAD HEENT: Normal. Neck: No JVD, no thyromegaly. Lungs: Clear to auscultation bilaterally with normal respiratory effort. CV: Nondisplaced PMI.  Irregular rhythm, normal S1/S2, no S3, no murmur. No pretibial or periankle edema.  No carotid bruit.  Normal pedal pulses.  Abdomen: Soft, nontender, no hepatosplenomegaly, no distention.  Neurologic: Alert and oriented x 3.  Psych: Normal affect. Skin: Normal. Musculoskeletal: Normal range of motion, no gross deformities. Extremities: No clubbing or cyanosis.   ECG: Most recent ECG reviewed.      ASSESSMENT AND PLAN: 1. Atrial fibrillation:  Heart rate appears to be well controlled. I stressed the importance of treating risk factors for atrial fibrillation such as sleep apnea, obesity, and hypertension. I will continue metoprolol 25 mg twice daily as well as Xarelto 20 mg daily given the apparent history of coagulopathy in his family. I discussed with him the possibility of atrial fibrillation ablation once risk factors are appropriately addressed, but he would like to talk about it with his wife first. I will aim to treat hypertension. He is scheduled to have a repeat sleep study. I also emphasized the importance of regular aerobic activity, perhaps in a pool. 2. Essential HTN: He used to take both Ziac and amlodipine. I will start amlodipine 5 mg daily and asked him to monitor his blood pressures. 3. Tobacco abuse: Cessation counseling provided. 4. Sleep apnea: He is due to have a repeat sleep study. The importance of CPAP compliance was emphasized. 5. Obesity: I gave him exercise counseling, specifically with regards to aquatic therapy.  Dispo: f/u 3 months.  Kate Sable, M.D., F.A.C.C.

## 2014-01-28 ENCOUNTER — Encounter: Payer: Self-pay | Admitting: Cardiovascular Disease

## 2014-01-28 ENCOUNTER — Telehealth: Payer: Self-pay | Admitting: Cardiovascular Disease

## 2014-01-28 ENCOUNTER — Ambulatory Visit (INDEPENDENT_AMBULATORY_CARE_PROVIDER_SITE_OTHER): Payer: BC Managed Care – PPO | Admitting: Cardiovascular Disease

## 2014-01-28 VITALS — BP 156/88 | HR 86 | Ht 71.0 in | Wt 184.0 lb

## 2014-01-28 DIAGNOSIS — R002 Palpitations: Secondary | ICD-10-CM

## 2014-01-28 DIAGNOSIS — Z716 Tobacco abuse counseling: Secondary | ICD-10-CM

## 2014-01-28 DIAGNOSIS — I10 Essential (primary) hypertension: Secondary | ICD-10-CM

## 2014-01-28 DIAGNOSIS — G441 Vascular headache, not elsewhere classified: Secondary | ICD-10-CM

## 2014-01-28 DIAGNOSIS — I481 Persistent atrial fibrillation: Secondary | ICD-10-CM

## 2014-01-28 DIAGNOSIS — G4733 Obstructive sleep apnea (adult) (pediatric): Secondary | ICD-10-CM

## 2014-01-28 DIAGNOSIS — I4819 Other persistent atrial fibrillation: Secondary | ICD-10-CM

## 2014-01-28 MED ORDER — LISINOPRIL 10 MG PO TABS: 10.0000 mg | ORAL_TABLET | Freq: Every day | ORAL | Status: DC

## 2014-01-28 MED ORDER — AMLODIPINE BESYLATE 10 MG PO TABS: 10.0000 mg | ORAL_TABLET | Freq: Every day | ORAL | Status: DC

## 2014-01-28 NOTE — Progress Notes (Signed)
Patient ID: ANIS DEGIDIO, male   DOB: 29-May-1960, 53 y.o.   MRN: [id number]      SUBJECTIVE: Mr. Suen has not been feeling well this past week.he has been feeling a pounding sensation behind his eyes and has felt flushed, accompanied by a headache and bilateral arm tingling and numbness as well as chest tightness, shortness of breath, and palpitations. He has been taking amlodipine 10 mg daily because he was unable to split the tablets. In spite of this, blood pressures at home have been running 150/90-110. He had to leave work early today. He is feeling somewhat better now that he has been resting comfortably at home. He applied peppermint oil to his forehead given to him by his wife to try and alleviate his symptoms.   Review of Systems: As per "subjective", otherwise negative.  Allergies  Allergen Reactions  . Penicillins   . Sulfa Antibiotics   . Tetracyclines & Related     Current Outpatient Prescriptions  Medication Sig Dispense Refill  . amLODipine (NORVASC) 10 MG tablet Take 0.5 tablets (5 mg total) by mouth daily.      . BuPROPion HCl (WELLBUTRIN PO) Take by mouth daily.      . metoprolol tartrate (LOPRESSOR) 25 MG tablet Take 1 tablet (25 mg total) by mouth 2 (two) times daily.  60 tablet  2  . omeprazole (PRILOSEC) 40 MG capsule Take 1 capsule by mouth daily.      . rivaroxaban (XARELTO) 20 MG TABS tablet Take 1 tablet (20 mg total) by mouth daily with supper.  30 tablet  1   No current facility-administered medications for this visit.    Past Medical History  Diagnosis Date  . Hypertension   . Reflux     Past Surgical History  Procedure Laterality Date  . Appendectomy    . Knee surgety      History   Social History  . Marital Status: Married    Spouse Name: N/A    Number of Children: N/A  . Years of Education: N/A   Occupational History  . Not on file.   Social History Main Topics  . Smoking status: Current Every Day Smoker -- 0.25 packs/day  for 25 years    Types: Cigarettes  . Smokeless tobacco: Not on file  . Alcohol Use: Yes     Comment: rarely  . Drug Use: No  . Sexual Activity: Not on file   Other Topics Concern  . Not on file   Social History Narrative  . No narrative on file     Filed Vitals:   01/28/14 1517  BP: 156/88  Pulse: 86  Height: 5\' 11"  (1.803 m)  Weight: 184 lb (83.462 kg)    PHYSICAL EXAM General: NAD  HEENT: Normal.  Neck: No JVD, no thyromegaly.  Lungs: Clear to auscultation bilaterally with normal respiratory effort.  CV: Nondisplaced PMI. Irregular rhythm, normal S1/S2, no S3, no murmur. No pretibial or periankle edema. No carotid bruit. Normal pedal pulses.  Abdomen: Soft, nontender, no hepatosplenomegaly, no distention.  Neurologic: Alert and oriented x 3.  Psych: Normal affect. Skin: Normal. Musculoskeletal: Normal range of motion, no gross deformities. Extremities: No clubbing or cyanosis.   ECG: Most recent ECG reviewed.      ASSESSMENT AND PLAN: 1. Atrial fibrillation: Heart rate appears to be well controlled. I stressed the importance of treating risk factors for atrial fibrillation such as sleep apnea, obesity, and hypertension. I will continue metoprolol 25 mg twice  daily as well as Xarelto 20 mg daily given the apparent history of coagulopathy in his family. I discussed with him the possibility of atrial fibrillation ablation once risk factors are appropriately addressed, but he would like to talk about it with his wife first. I will aim to treat hypertension. He is scheduled to have a repeat sleep study. I also emphasized the importance of regular aerobic activity, perhaps in a pool.  2. Essential HTN: Remains uncontrolled in spite of increasing amlodipine to 10 mg daily. Will start lisinopril 10 mg daily and ask him to continue to monitor BP. 3. Tobacco abuse: Cessation counseling provided.  4. Sleep apnea: He is due to have a repeat sleep study. The importance of CPAP  compliance was emphasized.  5. Obesity: I gave him exercise counseling, specifically with regards to aquatic therapy.   Dispo: f/u 1 month.  Kate Sable, M.D., F.A.C.C.

## 2014-01-28 NOTE — Telephone Encounter (Signed)
Discussed elevated BP with patient.  Last seen on Monday, 01/25/14 - was told to resume Amlodipine at 5mg  daily.  Stated that he had 10mg  tabs at home already so he was going to break in half.  Tabs are not scored, so very difficult to break.  BP was staying elevated, so he went ahead and took the full tab - 10mg .  Stated that he has had to leave work today due to not feeling well.  Ears feel like they are on fire, headache, arms tingly, little lightheaded.  No chest pain.    Discussed above with Dr. Bronson Ing - can offer OV this afternoon if feel necessary or make medication changes & follow.    Patient notified.  States that he prefers to come in this afternoon.  Scheduled for 3:20 today.

## 2014-01-28 NOTE — Telephone Encounter (Signed)
Please call patient back regarding BP still high / tgs

## 2014-01-28 NOTE — Patient Instructions (Signed)
   Begin Lisinopril 10mg  daily - new sent to pharm  Continue the Amlodipine at 10mg  daily Continue all other medications.   Follow up in  1 month

## 2014-02-11 ENCOUNTER — Ambulatory Visit (INDEPENDENT_AMBULATORY_CARE_PROVIDER_SITE_OTHER): Payer: BC Managed Care – PPO | Admitting: Neurology

## 2014-02-11 ENCOUNTER — Encounter: Payer: Self-pay | Admitting: Neurology

## 2014-02-11 VITALS — BP 136/92 | HR 82 | Temp 97.6°F | Ht 71.0 in | Wt 288.0 lb

## 2014-02-11 DIAGNOSIS — I4891 Unspecified atrial fibrillation: Secondary | ICD-10-CM

## 2014-02-11 DIAGNOSIS — G4733 Obstructive sleep apnea (adult) (pediatric): Secondary | ICD-10-CM

## 2014-02-11 DIAGNOSIS — R351 Nocturia: Secondary | ICD-10-CM

## 2014-02-11 DIAGNOSIS — R51 Headache: Secondary | ICD-10-CM

## 2014-02-11 DIAGNOSIS — R519 Headache, unspecified: Secondary | ICD-10-CM

## 2014-02-11 DIAGNOSIS — E669 Obesity, unspecified: Secondary | ICD-10-CM

## 2014-02-11 NOTE — Progress Notes (Signed)
Subjective:    Patient ID: Sean Morales is a 53 y.o. male.  HPI     Star Age, MD, PhD [facility] [address] [place], [place] [address]  Dear Dr. Ethlyn Gallery,   I saw your patient, Sean Morales, upon your kind request in my neurologic clinic today for initial consultation of his sleep disorder, in particular reevaluation of his obstructive sleep apnea. The patient is unaccompanied today. As you know, Sean Morales is a 53 year old right-handed gentleman with an underlying medical history of obesity, hypertension, smoking, and recent diagnosis of A. fib, who was previously diagnosed with obstructive sleep apnea but was not fully compliant with treatment. I do not have prior sleep study results available for review. He was recently seen by his cardiologist who encouraged the patient to be reevaluated and treated for OSA. He has a CPAP machine from over 5 years ago but hasn't used it in many years he admits. He could not tolerate the mask and the pressure at the time and found it to be a big nuisance because he had to go to the bathroom at least twice per night. His typical bedtime is reported to be around 10 PM and usual wake time is around 6:30 and 7:30 AM. Sleep onset typically occurs within minutes. He reports feeling marginally rested upon awakening. He wakes up on an average 2 times in the middle of the night and has to go to the bathroom 2 times on a typical night. He reports frequent morning headaches.  He reports excessive daytime somnolence (EDS) and His Epworth Sleepiness Score (ESS) is 11/24 today. He has not fallen asleep while driving. The patient has not been taking a planned nap, but likes to nap.  He has been known to snore for the past many years. Snoring is reportedly marked, and associated with choking sounds and witnessed apneas. The patient admits to a sense of choking or strangling feeling. The patient has not noted any RLS  symptoms and is not known to kick while asleep or before falling asleep. There is no family history of RLS or OSA.   He is a restless sleeper and in the morning, the bed is quite disheveled.   He denies cataplexy, sleep paralysis, hypnagogic or hypnopompic hallucinations, or sleep attacks. He does not report any vivid dreams, dream enactments, or parasomnias, such as sleep walking, but he has had nightmares and sleep talking before, especially when they are out of town and not in his usual sleep environment. The patient has not had a sleep study or a home sleep test in years, probably more than 5 years.  He consumes 1 caffeinated beverages per day, usually in the form of soda. Previously, he was drinking many cups of coffee, and 3 sodas per day.   His Past Medical History Is Significant For: Past Medical History  Diagnosis Date  . Hypertension   . Reflux   . Atrial fibrillation   . Depression     His Past Surgical History Is Significant For: Past Surgical History  Procedure Laterality Date  . Appendectomy    . Knee surgety    . Tonsillectomy      age 79    His Family History Is Significant For: Family History  Problem Relation Age of Onset  . Deep vein thrombosis Father   . Stroke Mother     His Social History Is Significant For: History   Social History  . Marital Status: Married  Spouse Name: [name]    Number of Children: 4  . Years of Education: 14   Occupational History  .      [employer]   Social History Main Topics  . Smoking status: Current Every Day Smoker -- 0.25 packs/day for 25 years    Types: Cigarettes  . Smokeless tobacco: Never Used  . Alcohol Use: 0.0 oz/week    0 Not specified per week     Comment: rarely  . Drug Use: No  . Sexual Activity: None   Other Topics Concern  . None   Social History Narrative   Patient 1 cup of coffee daily,occas. Soft drink    His Allergies Are:  Allergies  Allergen Reactions  . Penicillins   .  Sulfa Antibiotics   . Tetracyclines & Related   :   His Current Medications Are:  Outpatient Encounter Prescriptions as of 02/11/2014  Medication Sig  . amLODipine (NORVASC) 10 MG tablet Take 1 tablet (10 mg total) by mouth daily.  . BuPROPion HCl (WELLBUTRIN PO) Take by mouth daily.  Marland Kitchen lisinopril (PRINIVIL,ZESTRIL) 10 MG tablet Take 1 tablet (10 mg total) by mouth daily.  . metoprolol tartrate (LOPRESSOR) 25 MG tablet Take 1 tablet (25 mg total) by mouth 2 (two) times daily.  Marland Kitchen omeprazole (PRILOSEC) 40 MG capsule Take 1 capsule by mouth daily.  . rivaroxaban (XARELTO) 20 MG TABS tablet Take 1 tablet (20 mg total) by mouth daily with supper.  :  Review of Systems:  Out of a complete 14 point review of systems, all are reviewed and negative with the exception of these symptoms as listed below:   Review of Systems  Constitutional: Positive for fatigue.       Weight gain  Respiratory: Positive for cough and shortness of breath.   Cardiovascular: Positive for palpitations.  Musculoskeletal:       Joint pain  Neurological: Positive for headaches.  Psychiatric/Behavioral:       Depression    Objective:  Neurologic Exam  Physical Exam Physical Examination:   Filed Vitals:   02/11/14 1426  BP: 136/92  Pulse: 82  Temp: 97.6 F (36.4 C)    General Examination: The patient is a very pleasant 53 y.o. male in no acute distress. He appears well-developed and well-nourished and well groomed.   HEENT: Normocephalic, atraumatic, pupils are equal, round and reactive to light and accommodation. Funduscopic exam is normal with sharp disc margins noted. Extraocular tracking is good without limitation to gaze excursion or nystagmus noted. Normal smooth pursuit is noted. Hearing is grossly intact. Tympanic membranes are clear bilaterally. Face is symmetric with normal facial animation and normal facial sensation. Speech is clear with no dysarthria noted. There is no hypophonia. There is no  lip, neck/head, jaw or voice tremor. Neck is supple with full range of passive and active motion. There are no carotid bruits on auscultation. Oropharynx exam reveals: moderate mouth dryness, good dental hygiene and moderate airway crowding, due to larger tongue and redundant soft palate. Mallampati is class II. Tongue protrudes centrally and palate elevates symmetrically. Tonsils are absent. Neck size is 19.5 inches. He has a Mild overbite. Nasal inspection reveals no significant nasal mucosal bogginess or redness and no septal deviation.   Chest: Clear to auscultation without wheezing, rhonchi or crackles noted.  Heart: S1+S2+0, irregularly irregular with no murmurs noted.   Abdomen: Soft, non-tender and non-distended with normal bowel sounds appreciated on auscultation.  Extremities: There is trace pitting edema in  the distal lower extremities bilaterally. Pedal pulses are intact.  Skin: Warm and dry without trophic changes noted. There are no varicose veins.  Musculoskeletal: exam reveals no obvious joint deformities, tenderness or joint swelling or erythema.   Neurologically:  Mental status: The patient is awake, alert and oriented in all 4 spheres. His immediate and remote memory, attention, language skills and fund of knowledge are appropriate. There is no evidence of aphasia, agnosia, apraxia or anomia. Speech is clear with normal prosody and enunciation. Thought process is linear. Mood is normal and affect is normal.  Cranial nerves II - XII are as described above under HEENT exam. In addition: shoulder shrug is normal with equal shoulder height noted. Motor exam: Normal bulk, strength and tone is noted. There is no drift, tremor or rebound. Romberg is negative. Reflexes are 2+ throughout. Babinski: Toes are flexor bilaterally. Fine motor skills and coordination: intact with normal finger taps, normal hand movements, normal rapid alternating patting, normal foot taps and normal foot  agility.  Cerebellar testing: No dysmetria or intention tremor on finger to nose testing. Heel to shin is unremarkable bilaterally, with the exception of some pulling reported in his lower back. There is no truncal or gait ataxia.  Sensory exam: intact to light touch, pinprick, vibration, temperature sense in the upper and lower extremities.  Gait, station and balance: He stands easily. No veering to one side is noted. No leaning to one side is noted. Posture is age-appropriate and stance is narrow based. Gait shows normal stride length and normal pace. No problems turning are noted. He turns en bloc. Tandem walk is unremarkable. Intact toe and heel stance is noted.               Assessment and plan:  In summary, BRON SNELLINGS is a very pleasant 53 y.o.-year old male with an underlying medical history of obesity, hypertension, smoking, and recent diagnosis of A. fib, who was previously diagnosed with obstructive sleep apneabut was never compliant with treatment at the time. He presents for reevaluation of this due to recent diagnosis of A. Fib. He has discussed with his cardiologist the possibility of ablation surgery in the future. His cardiologist has advised him to try to lose weight, quit smoking, and get his sleep apnea rediagnosed and hopefully treated.the patient is motivated to lose and is willing to be diagnosed and treated for OSA. I had a long chat with the patient about my findings and the diagnosis of OSA, its prognosis and treatment options. We talked about medical treatments, surgical interventions and non-pharmacological approaches. I explained in particular the risks and ramifications of untreated moderate to severe OSA, especially with respect to developing cardiovascular disease down the Road, including congestive heart failure, difficult to treat hypertension, cardiac arrhythmias, or stroke. Even type 2 diabetes has, in part, been linked to untreated OSA. Symptoms of untreated OSA  include daytime sleepiness, memory problems, mood irritability and mood disorder such as depression and anxiety, lack of energy, as well as recurrent headaches, especially morning headaches. We talked about smoking cessation and trying to maintain a healthy lifestyle in general, as well as the importance of weight control. I encouraged the patient to eat healthy, exercise daily and keep well hydrated, to keep a scheduled bedtime and wake time routine, to not skip any meals and eat healthy snacks in between meals. I advised the patient not to drive when feeling sleepy. I recommended the following at this time: sleep study with potential positive airway  pressure titration. (We will score hypopneas at 3% and split the sleep study into diagnostic and treatment portion, if the estimated. 2 hour AHI is >15/h). He is advised that nocturia may improve with CPAP treatment for OSA.    I explained the sleep test procedure to the patient and also outlined possible surgical and non-surgical treatment options of OSA, including the use of a custom-made dental device (which would require a referral to a specialist dentist or oral surgeon), upper airway surgical options, such as pillar implants, radiofrequency surgery, tongue base surgery, and UPPP (which would involve a referral to an ENT surgeon). Rarely, jaw surgery such as mandibular advancement may be considered.  I also explained the CPAP treatment option to the patient, who indicated that he would be willing to try CPAP if the need arises. I explained the importance of being compliant with PAP treatment, not only for insurance purposes but primarily to improve His symptoms, and for the patient's long term health benefit, including to reduce His cardiovascular risks. I answered all his questions today and the patient was in agreement. I would like to see him back after the sleep study is completed and encouraged him to call with any interim questions, concerns, problems  or updates.   Thank you very much for allowing me to participate in the care of this nice patient. If I can be of any further assistance to you please do not hesitate to call me at [phone].  Sincerely,   Star Age, MD, PhD

## 2014-02-11 NOTE — Patient Instructions (Signed)

## 2014-03-03 ENCOUNTER — Ambulatory Visit: Payer: BC Managed Care – PPO | Admitting: Cardiovascular Disease

## 2014-03-05 ENCOUNTER — Encounter: Payer: Self-pay | Admitting: Cardiovascular Disease

## 2014-03-05 ENCOUNTER — Ambulatory Visit (INDEPENDENT_AMBULATORY_CARE_PROVIDER_SITE_OTHER): Payer: BC Managed Care – PPO | Admitting: Cardiovascular Disease

## 2014-03-05 VITALS — BP 123/90 | HR 64 | Ht 71.0 in | Wt 290.0 lb

## 2014-03-05 DIAGNOSIS — I4819 Other persistent atrial fibrillation: Secondary | ICD-10-CM

## 2014-03-05 DIAGNOSIS — Z716 Tobacco abuse counseling: Secondary | ICD-10-CM

## 2014-03-05 DIAGNOSIS — G4733 Obstructive sleep apnea (adult) (pediatric): Secondary | ICD-10-CM

## 2014-03-05 DIAGNOSIS — I481 Persistent atrial fibrillation: Secondary | ICD-10-CM

## 2014-03-05 DIAGNOSIS — G441 Vascular headache, not elsewhere classified: Secondary | ICD-10-CM

## 2014-03-05 DIAGNOSIS — R002 Palpitations: Secondary | ICD-10-CM

## 2014-03-05 DIAGNOSIS — I10 Essential (primary) hypertension: Secondary | ICD-10-CM

## 2014-03-05 NOTE — Progress Notes (Signed)
Patient ID: Sean Morales, male   DOB: 1961-03-06, 53 y.o.   MRN: [id number]      SUBJECTIVE: The patient returns for follow up of atrial fibrillation and HTN. He developed a cough with lisinopril and his PCP switched him to losartan and he is doing much better. He has had less headaches in the past week, and less frequent palpitations. He denies hematuria and hematochezia. He also denies leg swelling. He plans to quit smoking by 04/02/14. He is scheduled for a sleep study on 03/21/14.  Meds: Xarelto 20 mg, metoprolol 50 mg bid, losartan 100 mg daily.   Review of Systems: As per "subjective", otherwise negative.  Allergies  Allergen Reactions  . Penicillins   . Sulfa Antibiotics   . Tetracyclines & Related     Current Outpatient Prescriptions  Medication Sig Dispense Refill  . amLODipine (NORVASC) 10 MG tablet Take 1 tablet (10 mg total) by mouth daily.    . BuPROPion HCl (WELLBUTRIN PO) Take by mouth daily.    Marland Kitchen lisinopril (PRINIVIL,ZESTRIL) 10 MG tablet Take 1 tablet (10 mg total) by mouth daily. 30 tablet 6  . metoprolol tartrate (LOPRESSOR) 25 MG tablet Take 1 tablet (25 mg total) by mouth 2 (two) times daily. 60 tablet 2  . omeprazole (PRILOSEC) 40 MG capsule Take 1 capsule by mouth daily.    . rivaroxaban (XARELTO) 20 MG TABS tablet Take 1 tablet (20 mg total) by mouth daily with supper. 30 tablet 1   No current facility-administered medications for this visit.    Past Medical History  Diagnosis Date  . Hypertension   . Reflux   . Atrial fibrillation   . Depression     Past Surgical History  Procedure Laterality Date  . Appendectomy    . Knee surgety    . Tonsillectomy      age 77    History   Social History  . Marital Status: Married    Spouse Name: [name]    Number of Children: 4  . Years of Education: 14   Occupational History  .      [employer]   Social History Main Topics  . Smoking status: Current Every Day Smoker -- 0.25 packs/day for  25 years    Types: Cigarettes  . Smokeless tobacco: Never Used  . Alcohol Use: 0.0 oz/week    0 Not specified per week     Comment: rarely  . Drug Use: No  . Sexual Activity: Not on file   Other Topics Concern  . Not on file   Social History Narrative   Patient 1 cup of coffee daily,occas. Soft drink    BP 123/90 HR: 64 bpm Sats 98% RA Wt 290 lbs Ht 5'11"  PHYSICAL EXAM General: NAD, obese HEENT: Normal.  Neck: No JVD, no thyromegaly.  Lungs: Clear to auscultation bilaterally with normal respiratory effort.  CV: Nondisplaced PMI. Irregular rhythm, normal S1/S2, no S3, no murmur. No pretibial or periankle edema. No carotid bruit. Normal pedal pulses.  Abdomen: Soft, nontender, obese, no distention.  Neurologic: Alert and oriented x 3.  Psych: Normal affect. Skin: Normal. Musculoskeletal: Normal range of motion, no gross deformities. Extremities: No clubbing or cyanosis.   ECG: Most recent ECG reviewed.      ASSESSMENT AND PLAN: 1. Atrial fibrillation: Heart rate appears to be well controlled on metoprolol 50 mg bid. I again stressed the importance of treating risk factors for atrial fibrillation such as sleep apnea, obesity, and hypertension.  I will continue Xarelto 20 mg daily given the apparent history of coagulopathy in his family. I previously discussed with him the possibility of atrial fibrillation ablation once risk factors are appropriately addressed, but he would like to talk about it with his wife first. I will aim to treat hypertension. He is scheduled to have a repeat sleep study on 12/20. I also emphasized the importance of regular aerobic activity, perhaps in a pool.  2. Essential HTN: Better controlled on losartan 100 mg daily. 3. Tobacco abuse: Cessation counseling again provided. He aims to quit 04/02/14. 4. Sleep apnea: He is due to have a repeat sleep study on 12/20. The importance of CPAP compliance was emphasized.  5. Obesity: I again gave him  exercise counseling, specifically with regards to aquatic therapy.   Dispo: f/u 4 months.   Kate Sable, M.D., F.A.C.C.

## 2014-03-08 ENCOUNTER — Encounter: Payer: Self-pay | Admitting: Cardiovascular Disease

## 2014-03-21 ENCOUNTER — Ambulatory Visit (INDEPENDENT_AMBULATORY_CARE_PROVIDER_SITE_OTHER): Payer: BC Managed Care – PPO | Admitting: Neurology

## 2014-03-21 VITALS — BP 150/102

## 2014-03-21 DIAGNOSIS — G4730 Sleep apnea, unspecified: Secondary | ICD-10-CM

## 2014-03-21 DIAGNOSIS — I4891 Unspecified atrial fibrillation: Secondary | ICD-10-CM

## 2014-03-21 DIAGNOSIS — G4761 Periodic limb movement disorder: Secondary | ICD-10-CM

## 2014-03-21 DIAGNOSIS — G4733 Obstructive sleep apnea (adult) (pediatric): Secondary | ICD-10-CM

## 2014-03-21 DIAGNOSIS — G4710 Hypersomnia, unspecified: Secondary | ICD-10-CM

## 2014-03-22 NOTE — Sleep Study (Signed)
Please see the scanned sleep study interpretation located in the Procedure tab within the Chart Review section. 

## 2014-03-29 ENCOUNTER — Encounter: Payer: Self-pay | Admitting: Neurology

## 2014-03-29 ENCOUNTER — Telehealth: Payer: Self-pay | Admitting: Neurology

## 2014-03-29 DIAGNOSIS — G4733 Obstructive sleep apnea (adult) (pediatric): Secondary | ICD-10-CM

## 2014-03-29 NOTE — Telephone Encounter (Signed)
Please call and notify patient that the recent sleep study confirmed the diagnosis of OSA. He did well with CPAP during the study with significant improvement of the respiratory events. Therefore, I would like start the patient on CPAP at home. I placed the order in the chart.   Arrange for CPAP set up at home through a DME company of patient's choice and fax/route report to PCP and referring MD (if other than PCP).   The patient will also need a follow up appointment with me in 6-8 weeks post set up that has to be scheduled; help the patient schedule this (in a follow-up slot).   Please re-enforce the importance of compliance with treatment and the need for us to monitor compliance data.   Once you have spoken to the patient and scheduled the return appointment, you may close this encounter, thanks,   Kian Ottaviano, MD, PhD [facility] ([facility]    

## 2014-03-30 NOTE — Telephone Encounter (Signed)
Patient was contacted and provided the results of his overnight sleep study.  Patient was informed that there was a diagnosis of OSA and it was considered to be severe.  Patient was informed that CPAP therapy was effective in treating the apnea and that Dr. Rexene Alberts had written an order for the use of CPAP therapy.  The patient was in agreement and was referred to [facility] for DME referral.  Dr. Wendie Simmer was faxed a copy of the results and the patient gave permission to mail a copy of his test results.

## 2014-04-12 ENCOUNTER — Emergency Department (HOSPITAL_COMMUNITY)
Admission: EM | Admit: 2014-04-12 | Discharge: 2014-04-12 | Disposition: A | Discharge status: Home / Self Care | Payer: BLUE CROSS/BLUE SHIELD | Attending: Emergency Medicine | Admitting: Emergency Medicine

## 2014-04-12 ENCOUNTER — Emergency Department (HOSPITAL_COMMUNITY): Payer: BLUE CROSS/BLUE SHIELD

## 2014-04-12 ENCOUNTER — Encounter (HOSPITAL_COMMUNITY): Payer: Self-pay | Admitting: Emergency Medicine

## 2014-04-12 DIAGNOSIS — I482 Chronic atrial fibrillation, unspecified: Secondary | ICD-10-CM

## 2014-04-12 DIAGNOSIS — R197 Diarrhea, unspecified: Secondary | ICD-10-CM | POA: Insufficient documentation

## 2014-04-12 DIAGNOSIS — I10 Essential (primary) hypertension: Secondary | ICD-10-CM | POA: Insufficient documentation

## 2014-04-12 DIAGNOSIS — Z7901 Long term (current) use of anticoagulants: Secondary | ICD-10-CM | POA: Diagnosis not present

## 2014-04-12 DIAGNOSIS — Z8659 Personal history of other mental and behavioral disorders: Secondary | ICD-10-CM | POA: Diagnosis not present

## 2014-04-12 DIAGNOSIS — K219 Gastro-esophageal reflux disease without esophagitis: Secondary | ICD-10-CM | POA: Insufficient documentation

## 2014-04-12 DIAGNOSIS — R0602 Shortness of breath: Secondary | ICD-10-CM | POA: Diagnosis not present

## 2014-04-12 DIAGNOSIS — R42 Dizziness and giddiness: Secondary | ICD-10-CM | POA: Diagnosis not present

## 2014-04-12 DIAGNOSIS — Z87891 Personal history of nicotine dependence: Secondary | ICD-10-CM | POA: Diagnosis not present

## 2014-04-12 DIAGNOSIS — M545 Low back pain: Secondary | ICD-10-CM | POA: Insufficient documentation

## 2014-04-12 DIAGNOSIS — Z9981 Dependence on supplemental oxygen: Secondary | ICD-10-CM | POA: Diagnosis not present

## 2014-04-12 DIAGNOSIS — Z79899 Other long term (current) drug therapy: Secondary | ICD-10-CM | POA: Diagnosis not present

## 2014-04-12 DIAGNOSIS — Z880 Allergy status to penicillin: Secondary | ICD-10-CM | POA: Insufficient documentation

## 2014-04-12 DIAGNOSIS — R079 Chest pain, unspecified: Secondary | ICD-10-CM | POA: Insufficient documentation

## 2014-04-12 LAB — CBC
HCT: 45.7 % (ref 39.0–52.0)
HEMOGLOBIN: 15 g/dL (ref 13.0–17.0)
MCH: 29.9 pg (ref 26.0–34.0)
MCHC: 32.8 g/dL (ref 30.0–36.0)
MCV: 91 fL (ref 78.0–100.0)
PLATELETS: 239 10*3/uL (ref 150–400)
RBC: 5.02 MIL/uL (ref 4.22–5.81)
RDW: 13.4 % (ref 11.5–15.5)
WBC: 9.1 10*3/uL (ref 4.0–10.5)

## 2014-04-12 LAB — TROPONIN I: Troponin I: <0.03 ng/mL (ref <0.031)

## 2014-04-12 LAB — BASIC METABOLIC PANEL
Anion gap: 6 (ref 5–15)
BUN: 15 mg/dL (ref 6–23)
CHLORIDE: 106 meq/L (ref 96–112)
CO2: 26 mmol/L (ref 19–32)
Calcium: 8.6 mg/dL (ref 8.4–10.5)
Creatinine, Ser: 1.02 mg/dL (ref 0.50–1.35)
GFR calc non Af Amer: 82 mL/min — ABNORMAL LOW (ref >90)
GLUCOSE: 109 mg/dL — AB (ref 70–99)
Potassium: 4.3 mmol/L (ref 3.5–5.1)
Sodium: 138 mmol/L (ref 135–145)

## 2014-04-12 NOTE — ED Notes (Signed)
Pt reports general malaise,dizziness,sob,chest pain/palpitations since Saturday.

## 2014-04-12 NOTE — ED Provider Notes (Signed)
CSN: [id number]     Arrival date & time 04/12/14  7342 History  This chart was scribed for Fredia Sorrow, MD by Zola Button, ED Scribe. This patient was seen in room APA08/APA08 and the patient's care was started at 10:22 AM.     Chief Complaint  Patient presents with  . Chest Pain   Patient is a 54 y.o. male presenting with chest pain. The history is provided by the patient. No language interpreter was used.  Chest Pain Pain radiates to:  Does not radiate Pain radiates to the back: no   Pain severity:  Mild Onset quality:  Gradual Duration:  2 days Progression:  Unchanged Chronicity:  New Relieved by:  None tried Worsened by:  Nothing tried Ineffective treatments:  None tried Associated symptoms: back pain, dizziness and shortness of breath   Associated symptoms: no abdominal pain, no cough, no fever, no headache, no nausea and not vomiting    HPI Comments: OVADIA LOPP is a 54 y.o. male with a hx of A Fib who presents to the Emergency Department complaining of mild chest discomfort that started this past weekend, 2 days ago. The pain is described as a 1-2/10 in severity. Patient reports having associated dizziness and SOB. He also reports having diarrhea yesterday. He states he feels as if he is "fighting the urge to pass out." The dizziness is described as feeling as if in a bubble, and he does not feel as if the room is spinning. Patient denies recent changes in medications. He notes that he recently got a CPAP machine 4 days ago and has started to use it.  He is followed by Larene Pickett. He is followed by cardiology.  Past Medical History  Diagnosis Date  . Hypertension   . Reflux   . Atrial fibrillation   . Depression    Past Surgical History  Procedure Laterality Date  . Appendectomy    . Knee surgety    . Tonsillectomy      age 62   Family History  Problem Relation Age of Onset  . Deep vein thrombosis Father   . Stroke Mother    History  Substance Use Topics  .  Smoking status: Former Smoker -- 0.25 packs/day for 25 years    Types: Cigarettes  . Smokeless tobacco: Never Used  . Alcohol Use: No     Comment: rarely    Review of Systems  Constitutional: Negative for fever and chills.  HENT: Negative for congestion and sore throat.   Eyes: Negative for visual disturbance.  Respiratory: Positive for shortness of breath. Negative for cough.   Cardiovascular: Negative for chest pain and leg swelling.  Gastrointestinal: Positive for diarrhea. Negative for nausea, vomiting and abdominal pain.  Genitourinary: Negative for dysuria.  Musculoskeletal: Positive for back pain.  Skin: Negative for rash.  Neurological: Positive for dizziness and light-headedness. Negative for headaches.  Hematological: Bruises/bleeds easily.      Allergies  Penicillins; Sulfa antibiotics; and Tetracyclines & related  Home Medications   Prior to Admission medications   Medication Sig Start Date End Date Taking? Authorizing Provider  acetaminophen (TYLENOL) 500 MG tablet Take 1,000-1,500 mg by mouth every 6 (six) hours as needed for headache.   Yes Historical Provider, MD  amLODipine (NORVASC) 10 MG tablet Take 1 tablet (10 mg total) by mouth daily. 01/28/14  Yes [name], MD  ibuprofen (ADVIL,MOTRIN) 200 MG tablet Take 800 mg by mouth every 6 (six) hours as needed for headache.  Yes Historical Provider, MD  losartan (COZAAR) 100 MG tablet Take 1 tablet by mouth daily. 04/05/14  Yes Historical Provider, MD  metoprolol (LOPRESSOR) 50 MG tablet Take 50 mg by mouth 2 (two) times daily. 03/15/14  Yes Historical Provider, MD  omeprazole (PRILOSEC) 40 MG capsule Take 1 capsule by mouth daily. 12/26/13  Yes Historical Provider, MD  rivaroxaban (XARELTO) 20 MG TABS tablet Take 1 tablet (20 mg total) by mouth daily with supper. 01/11/14  Yes Erline Hau, MD  lisinopril (PRINIVIL,ZESTRIL) 10 MG tablet Take 1 tablet (10 mg total) by mouth daily. Patient not  taking: Reported on 04/12/2014 01/28/14   [name], MD  metoprolol tartrate (LOPRESSOR) 25 MG tablet Take 1 tablet (25 mg total) by mouth 2 (two) times daily. Patient not taking: Reported on 04/12/2014 01/11/14   Erline Hau, MD   BP 119/80 mmHg  Pulse 68  Temp(Src) 98.2 F (36.8 C) (Oral)  Resp 10  Ht 5\' 11"  (1.803 m)  Wt 290 lb (131.543 kg)  BMI 40.46 kg/m2  SpO2 100% Physical Exam  Constitutional: He is oriented to person, place, and time. He appears well-developed and well-nourished. No distress.  HENT:  Head: Normocephalic and atraumatic.  Mouth/Throat: Oropharynx is clear and moist. No oropharyngeal exudate.  Eyes: Pupils are equal, round, and reactive to light.  Neck: Neck supple.  Cardiovascular: Normal rate.  An irregular rhythm present.  No murmur heard. Pulmonary/Chest: Effort normal and breath sounds normal. No respiratory distress. He has no wheezes. He has no rales.  Abdominal: Bowel sounds are normal. There is no tenderness.  Musculoskeletal: He exhibits no edema.  Neurological: He is alert and oriented to person, place, and time. No cranial nerve deficit.  Skin: Skin is warm and dry. No rash noted.  Psychiatric: He has a normal mood and affect. His behavior is normal.  Nursing note and vitals reviewed.   ED Course  Procedures  DIAGNOSTIC STUDIES: Oxygen Saturation is 100% on room air, normal by my interpretation.    COORDINATION OF CARE: 10:28 AM-Discussed treatment plan which includes CXR and labs with pt at bedside and pt agreed to plan.    Labs Review Labs Reviewed  BASIC METABOLIC PANEL - Abnormal; Notable for the following:    Glucose, Bld 109 (*)    GFR calc non Af Amer 82 (*)    All other components within normal limits  CBC  TROPONIN I    Imaging Review Dg Chest Port 1 View  04/12/2014   CLINICAL DATA:  Left chest pressure.  EXAM: PORTABLE CHEST - 1 VIEW  COMPARISON:  01/09/2014.  FINDINGS: Trachea is midline. Heart  size normal. Lungs are somewhat low in volume but clear. No pleural fluid.  IMPRESSION: Lungs are somewhat low in volume but clear.   Electronically Signed   By: Lorin Picket M.D.   On: 04/12/2014 10:25     EKG Interpretation None      Date: 04/12/2014  Rate: 58  Rhythm: atrial fibrillation  QRS Axis: normal  Intervals: normal  ST/T Wave abnormalities: nonspecific ST/T changes  Conduction Disutrbances:none  Narrative Interpretation:   Old EKG Reviewed: none available      MDM   Final diagnoses:  Lightheadedness  Chronic atrial fibrillation  Chest pain, unspecified chest pain type    Patient's workup without any significant findings. EKG with atrial fibrillation but rate controlled. Troponin negative with expected to be positive there is any significant cardiac event since has had  pain since Saturday. Chest x-rays negative for pneumonia pneumothorax or pulmonary edema. Clinically not consistent with pulmonary embolus. Patient will be discharged home and follow-up with regular doctor and cardiologist.  I personally performed the services described in this documentation, which was scribed in my presence. The recorded information has been reviewed and is accurate.     Fredia Sorrow, MD 04/12/14 [phone]

## 2014-04-12 NOTE — Discharge Instructions (Signed)
Workup without significant findings. Recommend following up with your cardiologist in your regular doctor. Return for any new or worse symptoms.

## 2014-05-07 ENCOUNTER — Encounter: Payer: Self-pay | Admitting: Neurology

## 2014-05-10 ENCOUNTER — Ambulatory Visit: Payer: BC Managed Care – PPO | Admitting: Cardiovascular Disease

## 2014-05-27 ENCOUNTER — Other Ambulatory Visit (HOSPITAL_COMMUNITY): Payer: Self-pay | Admitting: Family Medicine

## 2014-05-27 DIAGNOSIS — R1013 Epigastric pain: Secondary | ICD-10-CM

## 2014-05-31 ENCOUNTER — Ambulatory Visit (HOSPITAL_COMMUNITY): Payer: BLUE CROSS/BLUE SHIELD

## 2014-06-02 NOTE — Progress Notes (Signed)
Quick Note:  I reviewed the patient's CPAP compliance data from 04/08/2014 through 05/06/2014 which is a total of 29 days during which time he used his machine every night with percent used days greater than 4 hours at 100%, indicating superb compliance with an average usage of 7 hours and 41 minutes. Residual AHI low at 0.4 per hour and leak at times high with the 95th percentile at 35.3 L/m. Pressure setting of 12 cm with EPR of 3. Please get in touch with patient regarding his follow-up appointment which is not currently pending. Please make a follow-up appointment so we can discuss and he needs to be reminded to check with his DME company regarding his high leak. They may help him adjust his mask to reduce leakage. Star Age, MD, PhD [facility] ([facility]  ______

## 2014-06-04 ENCOUNTER — Ambulatory Visit (HOSPITAL_COMMUNITY)
Admission: RE | Admit: 2014-06-04 | Discharge: 2014-06-04 | Disposition: A | Discharge status: Home / Self Care | Payer: BLUE CROSS/BLUE SHIELD | Source: Ambulatory Visit | Attending: Family Medicine | Admitting: Family Medicine

## 2014-06-04 DIAGNOSIS — R1030 Lower abdominal pain, unspecified: Secondary | ICD-10-CM | POA: Insufficient documentation

## 2014-06-04 DIAGNOSIS — R110 Nausea: Secondary | ICD-10-CM | POA: Diagnosis not present

## 2014-06-04 DIAGNOSIS — R197 Diarrhea, unspecified: Secondary | ICD-10-CM | POA: Diagnosis not present

## 2014-06-04 DIAGNOSIS — R1013 Epigastric pain: Secondary | ICD-10-CM

## 2014-06-04 MED ORDER — SODIUM CHLORIDE 0.9 % IJ SOLN
INTRAMUSCULAR | Status: AC
  Filled 2014-06-04: qty 45

## 2014-06-04 MED ORDER — IOHEXOL 300 MG/ML  SOLN
100.0000 mL | Freq: Once | INTRAMUSCULAR | Status: AC | PRN
  Administered 2014-06-04: 100 mL via INTRAVENOUS

## 2014-06-12 ENCOUNTER — Emergency Department (HOSPITAL_COMMUNITY)
Admission: EM | Admit: 2014-06-12 | Discharge: 2014-06-12 | Disposition: A | Discharge status: Home / Self Care | Payer: BLUE CROSS/BLUE SHIELD | Source: Home / Self Care | Attending: Family Medicine | Admitting: Family Medicine

## 2014-06-12 ENCOUNTER — Encounter (HOSPITAL_COMMUNITY): Payer: Self-pay | Admitting: Emergency Medicine

## 2014-06-12 DIAGNOSIS — M545 Low back pain, unspecified: Secondary | ICD-10-CM

## 2014-06-12 DIAGNOSIS — S39012A Strain of muscle, fascia and tendon of lower back, initial encounter: Secondary | ICD-10-CM

## 2014-06-12 MED ORDER — KETOROLAC TROMETHAMINE 60 MG/2ML IM SOLN
60.0000 mg | Freq: Once | INTRAMUSCULAR | Status: AC
  Administered 2014-06-12: 60 mg via INTRAMUSCULAR

## 2014-06-12 MED ORDER — TRAMADOL HCL 50 MG PO TABS: 50.0000 mg | ORAL_TABLET | Freq: Four times a day (QID) | ORAL | Status: DC | PRN

## 2014-06-12 MED ORDER — DICLOFENAC SODIUM 1 % TD GEL: 1.0000 "application " | Freq: Four times a day (QID) | TRANSDERMAL | Status: DC

## 2014-06-12 MED ORDER — KETOROLAC TROMETHAMINE 60 MG/2ML IM SOLN
INTRAMUSCULAR | Status: AC
  Filled 2014-06-12: qty 2

## 2014-06-12 NOTE — ED Provider Notes (Signed)
CSN: [id number]     Arrival date & time 06/12/14  1038 History   First MD Initiated Contact with Patient 06/12/14 1151     Chief Complaint  Patient presents with  . Back Pain   (Consider location/radiation/quality/duration/timing/severity/associated sxs/prior Treatment) HPI Comments: 54 year old male who is complaining of back pain for 3 weeks. His job includes primarily sitting most of the day. He occasionally gets up out of the chair to walk around but for the most part he sitting in a chair. Few days ago he had an episode of nausea and saw his PCP who performed an abdominal CT scan on him. Apparently there were no worrisome findings. He is taking Xarelto which of course is a risk factor for bleeding. The pain is located along the paralumbar and lower thoracic spine musculature. He denies trauma, fall or other event producing acute pain. Pain is worse with various movements, bending, stooping, getting into and out of bed. Motrin helps relieve the pain significantly.   Past Medical History  Diagnosis Date  . Hypertension   . Reflux   . Atrial fibrillation   . Depression    Past Surgical History  Procedure Laterality Date  . Appendectomy    . Knee surgety    . Tonsillectomy      age 56   Family History  Problem Relation Age of Onset  . Deep vein thrombosis Father   . Stroke Mother    History  Substance Use Topics  . Smoking status: Former Smoker -- 0.25 packs/day for 25 years    Types: Cigarettes  . Smokeless tobacco: Never Used  . Alcohol Use: No     Comment: rarely    Review of Systems  Constitutional: Negative.   Respiratory: Negative.   Gastrointestinal: Negative.   Genitourinary: Negative.   Musculoskeletal: Positive for myalgias and back pain. Negative for neck pain.       As per HPI  Skin: Negative.   Neurological: Negative for dizziness, weakness, numbness and headaches.    Allergies  Penicillins; Sulfa antibiotics; and Tetracyclines & related  Home  Medications   Prior to Admission medications   Medication Sig Start Date End Date Taking? Authorizing Provider  amLODipine (NORVASC) 10 MG tablet Take 1 tablet (10 mg total) by mouth daily. 01/28/14  Yes [name], MD  losartan (COZAAR) 100 MG tablet Take 1 tablet by mouth daily. 04/05/14  Yes Historical Provider, MD  metoprolol tartrate (LOPRESSOR) 25 MG tablet Take 1 tablet (25 mg total) by mouth 2 (two) times daily. 01/11/14  Yes Erline Hau, MD  omeprazole (PRILOSEC) 40 MG capsule Take 1 capsule by mouth daily. 12/26/13  Yes Historical Provider, MD  rivaroxaban (XARELTO) 20 MG TABS tablet Take 1 tablet (20 mg total) by mouth daily with supper. 01/11/14  Yes Estela Leonie Green, MD  acetaminophen (TYLENOL) 500 MG tablet Take 1,000-1,500 mg by mouth every 6 (six) hours as needed for headache.    Historical Provider, MD  diclofenac sodium (VOLTAREN) 1 % GEL Apply 1 application topically 4 (four) times daily. 06/12/14   Janne Napoleon, NP  ibuprofen (ADVIL,MOTRIN) 200 MG tablet Take 800 mg by mouth every 6 (six) hours as needed for headache.    Historical Provider, MD  metoprolol (LOPRESSOR) 50 MG tablet Take 50 mg by mouth 2 (two) times daily. 03/15/14   Historical Provider, MD  traMADol (ULTRAM) 50 MG tablet Take 1 tablet (50 mg total) by mouth every 6 (six) hours as needed. 06/12/14  Janne Napoleon, NP   BP 119/85 mmHg  Pulse 57  Temp(Src) 98.8 F (37.1 C) (Oral)  Resp 16  SpO2 96% Physical Exam  Constitutional: He is oriented to person, place, and time. He appears well-developed and well-nourished. No distress.  Neck: Normal range of motion.  Cardiovascular:  Irregular rhythm. Patient has chronic atrial fibrillation and is being followed for this.  Pulmonary/Chest: Effort normal and breath sounds normal. He has no wheezes.  Musculoskeletal:  Tenderness to the bilateral para lumbar and lower thoracic musculature. Mild tenderness over the lumbar spine. No deformities,  disc coloration or palpable step-off deformities. Lower extremity strength is 5 over 5 bilaterally.  Neurological: He is alert and oriented to person, place, and time. He exhibits normal muscle tone.  Skin: Skin is warm and dry.  Psychiatric: He has a normal mood and affect.  Nursing note and vitals reviewed.   ED Course  Procedures (including critical care time) Labs Review Labs Reviewed - No data to display  Imaging Review No results found.   MDM   1. Strain of lumbar paraspinal muscle, initial encounter   2. Bilateral low back pain without sciatica     COnt Motrin Diclofenac gel Heat locally. Stretches as demo'd Tramadol 50 mg #15  Janne Napoleon, NP 06/12/14 1220

## 2014-06-12 NOTE — ED Notes (Signed)
C/o mid to lower back pain.  Severity in pain comes and goes.  Mild relief with using advil.  Denies any know injury.  No heavy lifting.  Denies urinary symptoms.  No fever, n/v/d.

## 2014-06-12 NOTE — Discharge Instructions (Signed)
Back Pain, Adult Continue the Motrin Low back pain is very common. About 1 in 5 people have back pain.The cause of low back pain is rarely dangerous. The pain often gets better over time.About half of people with a sudden onset of back pain feel better in just 2 weeks. About 8 in 10 people feel better by 6 weeks.  CAUSES Some common causes of back pain include:  Strain of the muscles or ligaments supporting the spine.  Wear and tear (degeneration) of the spinal discs.  Arthritis.  Direct injury to the back. DIAGNOSIS Most of the time, the direct cause of low back pain is not known.However, back pain can be treated effectively even when the exact cause of the pain is unknown.Answering your caregiver's questions about your overall health and symptoms is one of the most accurate ways to make sure the cause of your pain is not dangerous. If your caregiver needs more information, he or she may order lab work or imaging tests (X-rays or MRIs).However, even if imaging tests show changes in your back, this usually does not require surgery. HOME CARE INSTRUCTIONS For many people, back pain returns.Since low back pain is rarely dangerous, it is often a condition that people can learn to [facility] their own.   Remain active. It is stressful on the back to sit or stand in one place. Do not sit, drive, or stand in one place for more than 30 minutes at a time. Take short walks on level surfaces as soon as pain allows.Try to increase the length of time you walk each day.  Do not stay in bed.Resting more than 1 or 2 days can delay your recovery.  Do not avoid exercise or work.Your body is made to move.It is not dangerous to be active, even though your back may hurt.Your back will likely heal faster if you return to being active before your pain is gone.  Pay attention to your body when you bend and lift. Many people have less discomfortwhen lifting if they bend their knees, keep the load close  to their bodies,and avoid twisting. Often, the most comfortable positions are those that put less stress on your recovering back.  Find a comfortable position to sleep. Use a firm mattress and lie on your side with your knees slightly bent. If you lie on your back, put a pillow under your knees.  Only take over-the-counter or prescription medicines as directed by your caregiver. Over-the-counter medicines to reduce pain and inflammation are often the most helpful.Your caregiver may prescribe muscle relaxant drugs.These medicines help dull your pain so you can more quickly return to your normal activities and healthy exercise.  Put ice on the injured area.  Put ice in a plastic bag.  Place a towel between your skin and the bag.  Leave the ice on for 15-20 minutes, 03-04 times a day for the first 2 to 3 days. After that, ice and heat may be alternated to reduce pain and spasms.  Ask your caregiver about trying back exercises and gentle massage. This may be of some benefit.  Avoid feeling anxious or stressed.Stress increases muscle tension and can worsen back pain.It is important to recognize when you are anxious or stressed and learn ways to manage it.Exercise is a great option. SEEK MEDICAL CARE IF:  You have pain that is not relieved with rest or medicine.  You have pain that does not improve in 1 week.  You have new symptoms.  You are generally not  feeling well. SEEK IMMEDIATE MEDICAL CARE IF:   You have pain that radiates from your back into your legs.  You develop new bowel or bladder control problems.  You have unusual weakness or numbness in your arms or legs.  You develop nausea or vomiting.  You develop abdominal pain.  You feel faint. Document Released: 03/19/2005 Document Revised: 09/18/2011 Document Reviewed: 07/21/2013 [facility] Patient Information 2015 [place], [place]. This information is not intended to replace advice given to you by your health care  provider. Make sure you discuss any questions you have with your health care provider.  Lumbosacral Strain Lumbosacral strain is a strain of any of the parts that make up your lumbosacral vertebrae. Your lumbosacral vertebrae are the bones that make up the lower third of your backbone. Your lumbosacral vertebrae are held together by muscles and tough, fibrous tissue (ligaments).  CAUSES  A sudden blow to your back can cause lumbosacral strain. Also, anything that causes an excessive stretch of the muscles in the low back can cause this strain. This is typically seen when people exert themselves strenuously, fall, lift heavy objects, bend, or crouch repeatedly. RISK FACTORS  Physically demanding work.  Participation in pushing or pulling sports or sports that require a sudden twist of the back (tennis, golf, baseball).  Weight lifting.  Excessive lower back curvature.  Forward-tilted pelvis.  Weak back or abdominal muscles or both.  Tight hamstrings. SIGNS AND SYMPTOMS  Lumbosacral strain may cause pain in the area of your injury or pain that moves (radiates) down your leg.  DIAGNOSIS Your health care provider can often diagnose lumbosacral strain through a physical exam. In some cases, you may need tests such as X-ray exams.  TREATMENT  Treatment for your lower back injury depends on many factors that your clinician will have to evaluate. However, most treatment will include the use of anti-inflammatory medicines. HOME CARE INSTRUCTIONS   Avoid hard physical activities (tennis, racquetball, waterskiing) if you are not in proper physical condition for it. This may aggravate or create problems.  If you have a back problem, avoid sports requiring sudden body movements. Swimming and walking are generally safer activities.  Maintain good posture.  Maintain a healthy weight.  For acute conditions, you may put ice on the injured area.  Put ice in a plastic bag.  Place a towel  between your skin and the bag.  Leave the ice on for 20 minutes, 2-3 times a day.  When the low back starts healing, stretching and strengthening exercises may be recommended. SEEK MEDICAL CARE IF:  Your back pain is getting worse.  You experience severe back pain not relieved with medicines. SEEK IMMEDIATE MEDICAL CARE IF:   You have numbness, tingling, weakness, or problems with the use of your arms or legs.  There is a change in bowel or bladder control.  You have increasing pain in any area of the body, including your belly (abdomen).  You notice shortness of breath, dizziness, or feel faint.  You feel sick to your stomach (nauseous), are throwing up (vomiting), or become sweaty.  You notice discoloration of your toes or legs, or your feet get very cold. MAKE SURE YOU:   Understand these instructions.  Will watch your condition.  Will get help right away if you are not doing well or get worse. Document Released: 12/27/2004 Document Revised: 03/24/2013 Document Reviewed: 11/05/2012 [facility] Patient Information 2015 [place], [place]. This information is not intended to replace advice given to you by your  health care provider. Make sure you discuss any questions you have with your health care provider. ° °

## 2014-07-12 ENCOUNTER — Encounter: Payer: Self-pay | Admitting: Neurology

## 2015-01-05 ENCOUNTER — Telehealth: Payer: Self-pay | Admitting: Physician Assistant

## 2015-01-05 NOTE — Telephone Encounter (Signed)
Received records from [facility] for appointment on 01/06/15 with Tarri Fuller, PA.  Records given to [employer] (medical records) for Bryan's schedule on 01/06/15. lp

## 2015-01-06 ENCOUNTER — Ambulatory Visit (INDEPENDENT_AMBULATORY_CARE_PROVIDER_SITE_OTHER): Payer: BLUE CROSS/BLUE SHIELD | Admitting: Physician Assistant

## 2015-01-06 ENCOUNTER — Encounter: Payer: Self-pay | Admitting: Physician Assistant

## 2015-01-06 VITALS — BP 160/110 | HR 73 | Ht 72.0 in | Wt 301.9 lb

## 2015-01-06 DIAGNOSIS — R079 Chest pain, unspecified: Secondary | ICD-10-CM

## 2015-01-06 MED ORDER — HYDROCHLOROTHIAZIDE 12.5 MG PO CAPS: 12.5000 mg | ORAL_CAPSULE | Freq: Every day | ORAL | Status: DC

## 2015-01-06 NOTE — Progress Notes (Signed)
Patient ID: NITISH ROES, male   DOB: Jul 08, 1960, 54 y.o.   MRN: [id number]    Date:  01/06/2015   ID:  [name], DOB [birth year], MRN [id number]  PCP:  [facility]  Primary Cardiologist:  Jacinta Shoe  Chief Complaint  Patient presents with  . Follow-up    Afib/ pt states he feels bad  . Chest Pain    every day/pt states sharp pain, tightness, and pressure/happens multiple times throughout the day  . Shortness of Breath    on exertion  . Dizziness    when changing positions/ happens more often lately     History of Present Illness: SHEAN GERDING is a 54 y.o. male history of hypertension, reflux, atrial fibrillation and depression. His last echocardiogram was October 2015. An ejection fraction of 55-60% with normal wall motion. Trivial aortic and mitral valve regurgitation.  She presents for follow-up of his atrial fibrillation. He's been on Xarelto for at least 9 months his rate seems to be well-controlled during the last year on metoprolol 50 mg twice a day. We've never tried to cardiovert him. He's been very compliant with his CPAP. He reports having some dull chest pressure which is 2 out of 10 in intensity and associated dizziness and flushing/sweatiness. Seems to think it happens mostly in the morning. 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 The pain does not seem to be having exertional component. About once every 2 weeks he does have a very sharp centrally located chest pain which is worse with inspiration.  He does continue to smoke and admits that he has tried quitting 3 times this year  The patient currently denies nausea, vomiting, fever, chest pain, shortness of breath, orthopnea, dizziness, PND, cough, congestion, abdominal pain, hematochezia, melena, lower extremity edema, claudication.  Wt Readings from Last 3 Encounters:  04/12/14 131.543 kg (290 lb)  03/08/14 131.543 kg (290 lb)  02/11/14 130.636 kg (288 lb)     Past Medical  History  Diagnosis Date  . Hypertension   . Reflux   . Atrial fibrillation   . Depression    Family History  Problem Relation Age of Onset  . Deep vein thrombosis Father   . Stroke Mother     Current Outpatient Prescriptions  Medication Sig Dispense Refill  . acetaminophen (TYLENOL) 500 MG tablet Take 1,000-1,500 mg by mouth every 6 (six) hours as needed for headache.    Marland Kitchen amLODipine (NORVASC) 10 MG tablet Take 1 tablet (10 mg total) by mouth daily.    Marland Kitchen ibuprofen (ADVIL,MOTRIN) 200 MG tablet Take 800 mg by mouth every 6 (six) hours as needed for headache.    . losartan (COZAAR) 100 MG tablet Take 1 tablet by mouth daily.    . metoprolol (LOPRESSOR) 50 MG tablet Take 50 mg by mouth 2 (two) times daily.  3  . omeprazole (PRILOSEC) 40 MG capsule Take 1 capsule by mouth daily.    . rivaroxaban (XARELTO) 20 MG TABS tablet Take 1 tablet (20 mg total) by mouth daily with supper. 30 tablet 1  . [DISCONTINUED] lisinopril (PRINIVIL,ZESTRIL) 10 MG tablet Take 1 tablet (10 mg total) by mouth daily. (Patient not taking: Reported on 04/12/2014) 30 tablet 6   No current facility-administered medications for this visit.    Allergies:    Allergies  Allergen Reactions  . Penicillins Other (See Comments)    Unknown. Childhood allergy.  . Sulfa Antibiotics Other (See Comments)    Unknown. Childhood allergy.  Marland Kitchen  Tetracyclines & Related Other (See Comments)    Unknown. Childhood allergy.    Social History:  The patient  reports that he has quit smoking. His smoking use included Cigarettes. He has a 6.25 pack-year smoking history. He has never used smokeless tobacco. He reports that he does not drink alcohol or use illicit drugs.   Family history:   Family History  Problem Relation Age of Onset  . Deep vein thrombosis Father   . Stroke Mother     ROS:  Please see the history of present illness.  All other systems reviewed and negative.   PHYSICAL EXAM: VS:  There were no vitals taken for  this visit. Obese, well developed, in no acute distress HEENT: Pupils are equal round react to light accommodation extraocular movements are intact.  Neck: no JVDNo cervical lymphadenopathy. Cardiac: Irregular rate and rhythm without murmurs rubs or gallops. Lungs:  clear to auscultation bilaterally, no wheezing, rhonchi or rales Abd: soft, nontender, positive bowel sounds all quadrants Ext: 1+ lower extremity edema.  2+ radial and dorsalis pedis pulses. Skin: warm and dry Neuro:  Grossly normal  EKG:  Atrial fibrillation rate 73 bpm  ASSESSMENT AND PLAN:  Problem List Items Addressed This Visit    None     1. Atrial fibrillation:  Heart rate appears to be well controlled on metoprolol 50 mg bid. He's been compliant with Xarelto. Ablation has been discussed in the past.  We could also consider a DCCV since this has not been attempted yet.  Will discuss with Dr. Jacinta Shoe.   2. Essential HTN:  Currently elevated. He reports his blood pressure at home usually runs in the 140s over 90s.  He currently has coccydynia which is quite painful and maybe partially contributing to his elevated blood pressure. he has about 1+ lower extremity edema. I'll add HCTZ 12.5 to his regiment.   3. Tobacco abuse: Cessation counseling again 4. Sleep apnea: He reports being compliant with his CPAP  5. Obesity: I recommended referral to a registered dietitian. 6  chest pain:   Certainly has some typical components to it. We'll conduct a Agricultural consultant. He's been compliant with Xarelto I think pulmonary embolism is less likely

## 2015-01-06 NOTE — Patient Instructions (Addendum)
Your physician has recommended you make the following change in your medication: START HCTZ 12.5 MG DAILY   Your physician has requested that you have a lexiscan myoview. For further information please visit [email or web address]. Please follow instruction sheet, as given.  Your physician recommends that you schedule a follow-up appointment in: [place], [place]

## 2015-01-07 ENCOUNTER — Other Ambulatory Visit: Payer: Self-pay | Admitting: *Deleted

## 2015-01-07 DIAGNOSIS — I480 Paroxysmal atrial fibrillation: Secondary | ICD-10-CM

## 2015-01-12 ENCOUNTER — Other Ambulatory Visit: Payer: Self-pay | Admitting: Cardiovascular Disease

## 2015-01-12 ENCOUNTER — Telehealth (HOSPITAL_COMMUNITY): Payer: Self-pay | Admitting: *Deleted

## 2015-01-12 NOTE — Telephone Encounter (Signed)
Patient given detailed instructions per Myocardial Perfusion Study Information Sheet for test on 01/17/15 at 1245. Patient notified to arrive 15 minutes early and that it is imperative to arrive on time for appointment to keep from having the test rescheduled.  If you need to cancel or reschedule your appointment, please call the office within 24 hours of your appointment. Failure to do so may result in a cancellation of your appointment, and a $50 no show fee. Patient verbalized understanding. Hubbard Robinson, RN

## 2015-01-13 ENCOUNTER — Telehealth: Payer: Self-pay

## 2015-01-13 NOTE — Telephone Encounter (Signed)
Pt given instructions for cardioversion,will arrive NPO with driver at 9:10 to main entrance [facility] and will hold metoprolol night before and morning of.

## 2015-01-13 NOTE — Telephone Encounter (Signed)
-----   Message from [name], MD sent at 01/12/2015 11:43 AM EDT ----- Regarding: pre-cardioversion instructions Cathey, This patient is scheduled to undergo cardioversion with me on 10/20. Please have him hold metoprolol the morning of procedure and the night before.  Thanks.  Dr. Raliegh Ip

## 2015-01-17 ENCOUNTER — Ambulatory Visit (HOSPITAL_COMMUNITY): Payer: BLUE CROSS/BLUE SHIELD | Attending: Cardiovascular Disease

## 2015-01-17 ENCOUNTER — Other Ambulatory Visit (HOSPITAL_COMMUNITY): Payer: BLUE CROSS/BLUE SHIELD

## 2015-01-17 DIAGNOSIS — F17200 Nicotine dependence, unspecified, uncomplicated: Secondary | ICD-10-CM | POA: Insufficient documentation

## 2015-01-17 DIAGNOSIS — R002 Palpitations: Secondary | ICD-10-CM | POA: Diagnosis not present

## 2015-01-17 DIAGNOSIS — R0609 Other forms of dyspnea: Secondary | ICD-10-CM | POA: Insufficient documentation

## 2015-01-17 DIAGNOSIS — I10 Essential (primary) hypertension: Secondary | ICD-10-CM | POA: Insufficient documentation

## 2015-01-17 DIAGNOSIS — R079 Chest pain, unspecified: Secondary | ICD-10-CM | POA: Insufficient documentation

## 2015-01-17 DIAGNOSIS — R0602 Shortness of breath: Secondary | ICD-10-CM | POA: Insufficient documentation

## 2015-01-17 MED ORDER — REGADENOSON 0.4 MG/5ML IV SOLN
0.4000 mg | Freq: Once | INTRAVENOUS | Status: AC
  Administered 2015-01-17: 0.4 mg via INTRAVENOUS

## 2015-01-17 MED ORDER — TECHNETIUM TC 99M SESTAMIBI GENERIC - CARDIOLITE
31.3000 | Freq: Once | INTRAVENOUS | Status: AC | PRN
  Administered 2015-01-17: 31.3 via INTRAVENOUS

## 2015-01-18 ENCOUNTER — Ambulatory Visit (HOSPITAL_BASED_OUTPATIENT_CLINIC_OR_DEPARTMENT_OTHER): Payer: BLUE CROSS/BLUE SHIELD

## 2015-01-18 DIAGNOSIS — R0989 Other specified symptoms and signs involving the circulatory and respiratory systems: Secondary | ICD-10-CM

## 2015-01-18 LAB — MYOCARDIAL PERFUSION IMAGING
CHL CUP NUCLEAR SRS: 1
CHL CUP NUCLEAR SSS: 3
CSEPPHR: 88 {beats}/min
LV sys vol: 44 mL
LVDIAVOL: 110 mL
RATE: 0.37
Rest HR: 77 {beats}/min
SDS: 2
TID: 0.98

## 2015-01-18 MED ORDER — TECHNETIUM TC 99M SESTAMIBI GENERIC - CARDIOLITE
32.7000 | Freq: Once | INTRAVENOUS | Status: AC | PRN
  Administered 2015-01-18: 32.7 via INTRAVENOUS

## 2015-01-20 ENCOUNTER — Encounter (HOSPITAL_COMMUNITY): Payer: Self-pay | Admitting: *Deleted

## 2015-01-20 ENCOUNTER — Other Ambulatory Visit: Payer: Self-pay | Admitting: Cardiovascular Disease

## 2015-01-20 ENCOUNTER — Other Ambulatory Visit: Payer: Self-pay

## 2015-01-20 ENCOUNTER — Ambulatory Visit (HOSPITAL_COMMUNITY): Payer: BLUE CROSS/BLUE SHIELD | Admitting: Anesthesiology

## 2015-01-20 ENCOUNTER — Telehealth: Payer: Self-pay

## 2015-01-20 ENCOUNTER — Encounter (HOSPITAL_COMMUNITY)
Admission: RE | Disposition: A | Discharge status: Home / Self Care | Payer: Self-pay | Source: Ambulatory Visit | Attending: Cardiovascular Disease

## 2015-01-20 ENCOUNTER — Ambulatory Visit (HOSPITAL_COMMUNITY)
Admission: RE | Admit: 2015-01-20 | Discharge: 2015-01-20 | Disposition: A | Discharge status: Home / Self Care | Payer: BLUE CROSS/BLUE SHIELD | Source: Ambulatory Visit | Attending: Cardiovascular Disease | Admitting: Cardiovascular Disease

## 2015-01-20 DIAGNOSIS — Z881 Allergy status to other antibiotic agents status: Secondary | ICD-10-CM | POA: Insufficient documentation

## 2015-01-20 DIAGNOSIS — F329 Major depressive disorder, single episode, unspecified: Secondary | ICD-10-CM | POA: Insufficient documentation

## 2015-01-20 DIAGNOSIS — Z6841 Body Mass Index (BMI) 40.0 and over, adult: Secondary | ICD-10-CM | POA: Diagnosis not present

## 2015-01-20 DIAGNOSIS — Z882 Allergy status to sulfonamides status: Secondary | ICD-10-CM | POA: Diagnosis not present

## 2015-01-20 DIAGNOSIS — Z880 Allergy status to penicillin: Secondary | ICD-10-CM | POA: Insufficient documentation

## 2015-01-20 DIAGNOSIS — K219 Gastro-esophageal reflux disease without esophagitis: Secondary | ICD-10-CM | POA: Diagnosis not present

## 2015-01-20 DIAGNOSIS — Z79899 Other long term (current) drug therapy: Secondary | ICD-10-CM

## 2015-01-20 DIAGNOSIS — G4730 Sleep apnea, unspecified: Secondary | ICD-10-CM | POA: Insufficient documentation

## 2015-01-20 DIAGNOSIS — I10 Essential (primary) hypertension: Secondary | ICD-10-CM | POA: Diagnosis not present

## 2015-01-20 DIAGNOSIS — I481 Persistent atrial fibrillation: Secondary | ICD-10-CM | POA: Diagnosis not present

## 2015-01-20 DIAGNOSIS — Z87891 Personal history of nicotine dependence: Secondary | ICD-10-CM | POA: Diagnosis not present

## 2015-01-20 DIAGNOSIS — E669 Obesity, unspecified: Secondary | ICD-10-CM | POA: Diagnosis not present

## 2015-01-20 HISTORY — PX: CARDIOVERSION: SHX1299

## 2015-01-20 SURGERY — CARDIOVERSION
Anesthesia: Monitor Anesthesia Care

## 2015-01-20 MED ORDER — METOPROLOL TARTRATE 25 MG PO TABS: 25.0000 mg | ORAL_TABLET | Freq: Two times a day (BID) | ORAL | Status: DC

## 2015-01-20 MED ORDER — ONDANSETRON HCL 4 MG/2ML IJ SOLN: 4.0000 mg | Freq: Once | INTRAMUSCULAR | Status: DC | PRN

## 2015-01-20 MED ORDER — LACTATED RINGERS IV SOLN
INTRAVENOUS | Status: DC
  Administered 2015-01-20: 10:00:00 via INTRAVENOUS

## 2015-01-20 MED ORDER — FENTANYL CITRATE (PF) 100 MCG/2ML IJ SOLN
INTRAMUSCULAR | Status: AC
  Filled 2015-01-20: qty 4

## 2015-01-20 MED ORDER — ONDANSETRON HCL 4 MG/2ML IJ SOLN
4.0000 mg | Freq: Once | INTRAMUSCULAR | Status: AC
  Administered 2015-01-20: 4 mg via INTRAVENOUS
  Filled 2015-01-20: qty 2

## 2015-01-20 MED ORDER — MIDAZOLAM HCL 5 MG/5ML IJ SOLN
INTRAMUSCULAR | Status: DC | PRN
  Administered 2015-01-20: 2 mg via INTRAVENOUS

## 2015-01-20 MED ORDER — FENTANYL CITRATE (PF) 100 MCG/2ML IJ SOLN: 25.0000 ug | INTRAMUSCULAR | Status: DC | PRN

## 2015-01-20 MED ORDER — FENTANYL CITRATE (PF) 100 MCG/2ML IJ SOLN
25.0000 ug | INTRAMUSCULAR | Status: AC
  Administered 2015-01-20 (×2): 25 ug via INTRAVENOUS
  Filled 2015-01-20: qty 2

## 2015-01-20 MED ORDER — AMIODARONE HCL 200 MG PO TABS: ORAL_TABLET | ORAL | Status: DC

## 2015-01-20 MED ORDER — PROPOFOL 500 MG/50ML IV EMUL
INTRAVENOUS | Status: DC | PRN
  Administered 2015-01-20: 125 ug/kg/min via INTRAVENOUS

## 2015-01-20 NOTE — Transfer of Care (Signed)
Immediate Anesthesia Transfer of Care Note  Patient: Sean Morales  Procedure(s) Performed: Procedure(s): CARDIOVERSION (N/A)  Patient Location: PACU  Anesthesia Type:MAC  Level of Consciousness: awake, alert  and patient cooperative  Airway & Oxygen Therapy: Patient Spontanous Breathing and Patient connected to face mask oxygen  Post-op Assessment: Report given to RN, Post -op Vital signs reviewed and stable and Patient moving all extremities  Post vital signs: Reviewed and stable  Last Vitals:  Filed Vitals:   01/20/15 1020  BP:   Pulse: 77  Temp:   Resp: 19    Complications: No apparent anesthesia complications

## 2015-01-20 NOTE — Interval H&P Note (Signed)
History and Physical Interval Note: No changes. Proceed with cardioversion.  01/20/2015 9:42 AM  Sean Morales  has presented today for surgery, with the diagnosis of a-fib  The various methods of treatment have been discussed with the patient and family. After consideration of risks, benefits and other options for treatment, the patient has consented to  Procedure(s): CARDIOVERSION (N/A) as a surgical intervention .  The patient's history has been reviewed, patient examined, no change in status, stable for surgery.  I have reviewed the patient's chart and labs.  Questions were answered to the patient's satisfaction.     [name]

## 2015-01-20 NOTE — H&P (View-Only) (Signed)
Patient ID: Sean Morales, male   DOB: [birth year], 54 y.o.   MRN: [id number]    Date:  01/06/2015   ID:  Sean Morales, DOB Apr 17, 1960, MRN [id number]  PCP:  [facility]  Primary Cardiologist:  Jacinta Shoe  Chief Complaint  Patient presents with  . Follow-up    Afib/ pt states he feels bad  . Chest Pain    every day/pt states sharp pain, tightness, and pressure/happens multiple times throughout the day  . Shortness of Breath    on exertion  . Dizziness    when changing positions/ happens more often lately     History of Present Illness: Sean Morales is a 54 y.o. male history of hypertension, reflux, atrial fibrillation and depression. His last echocardiogram was October 2015. An ejection fraction of 55-60% with normal wall motion. Trivial aortic and mitral valve regurgitation.  She presents for follow-up of his atrial fibrillation. He's been on Xarelto for at least 9 months his rate seems to be well-controlled during the last year on metoprolol 50 mg twice a day. We've never tried to cardiovert him. He's been very compliant with his CPAP. He reports having some dull chest pressure which is 2 out of 10 in intensity and associated dizziness and flushing/sweatiness. Seems to think it happens mostly in the morning. 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 The pain does not seem to be having exertional component. About once every 2 weeks he does have a very sharp centrally located chest pain which is worse with inspiration.  He does continue to smoke and admits that he has tried quitting 3 times this year  The patient currently denies nausea, vomiting, fever, chest pain, shortness of breath, orthopnea, dizziness, PND, cough, congestion, abdominal pain, hematochezia, melena, lower extremity edema, claudication.  Wt Readings from Last 3 Encounters:  04/12/14 131.543 kg (290 lb)  03/08/14 131.543 kg (290 lb)  02/11/14 130.636 kg (288 lb)     Past Medical  History  Diagnosis Date  . Hypertension   . Reflux   . Atrial fibrillation   . Depression    Family History  Problem Relation Age of Onset  . Deep vein thrombosis Father   . Stroke Mother     Current Outpatient Prescriptions  Medication Sig Dispense Refill  . acetaminophen (TYLENOL) 500 MG tablet Take 1,000-1,500 mg by mouth every 6 (six) hours as needed for headache.    Marland Kitchen amLODipine (NORVASC) 10 MG tablet Take 1 tablet (10 mg total) by mouth daily.    Marland Kitchen ibuprofen (ADVIL,MOTRIN) 200 MG tablet Take 800 mg by mouth every 6 (six) hours as needed for headache.    . losartan (COZAAR) 100 MG tablet Take 1 tablet by mouth daily.    . metoprolol (LOPRESSOR) 50 MG tablet Take 50 mg by mouth 2 (two) times daily.  3  . omeprazole (PRILOSEC) 40 MG capsule Take 1 capsule by mouth daily.    . rivaroxaban (XARELTO) 20 MG TABS tablet Take 1 tablet (20 mg total) by mouth daily with supper. 30 tablet 1  . [DISCONTINUED] lisinopril (PRINIVIL,ZESTRIL) 10 MG tablet Take 1 tablet (10 mg total) by mouth daily. (Patient not taking: Reported on 04/12/2014) 30 tablet 6   No current facility-administered medications for this visit.    Allergies:    Allergies  Allergen Reactions  . Penicillins Other (See Comments)    Unknown. Childhood allergy.  . Sulfa Antibiotics Other (See Comments)    Unknown. Childhood allergy.  Marland Kitchen  Tetracyclines & Related Other (See Comments)    Unknown. Childhood allergy.    Social History:  The patient  reports that he has quit smoking. His smoking use included Cigarettes. He has a 6.25 pack-year smoking history. He has never used smokeless tobacco. He reports that he does not drink alcohol or use illicit drugs.   Family history:   Family History  Problem Relation Age of Onset  . Deep vein thrombosis Father   . Stroke Mother     ROS:  Please see the history of present illness.  All other systems reviewed and negative.   PHYSICAL EXAM: VS:  There were no vitals taken for  this visit. Obese, well developed, in no acute distress HEENT: Pupils are equal round react to light accommodation extraocular movements are intact.  Neck: no JVDNo cervical lymphadenopathy. Cardiac: Irregular rate and rhythm without murmurs rubs or gallops. Lungs:  clear to auscultation bilaterally, no wheezing, rhonchi or rales Abd: soft, nontender, positive bowel sounds all quadrants Ext: 1+ lower extremity edema.  2+ radial and dorsalis pedis pulses. Skin: warm and dry Neuro:  Grossly normal  EKG:  Atrial fibrillation rate 73 bpm  ASSESSMENT AND PLAN:  Problem List Items Addressed This Visit    None     1. Atrial fibrillation:  Heart rate appears to be well controlled on metoprolol 50 mg bid. He's been compliant with Xarelto. Ablation has been discussed in the past.  We could also consider a DCCV since this has not been attempted yet.  Will discuss with Dr. Jacinta Shoe.   2. Essential HTN:  Currently elevated. He reports his blood pressure at home usually runs in the 140s over 90s.  He currently has coccydynia which is quite painful and maybe partially contributing to his elevated blood pressure. he has about 1+ lower extremity edema. I'll add HCTZ 12.5 to his regiment.   3. Tobacco abuse: Cessation counseling again 4. Sleep apnea: He reports being compliant with his CPAP  5. Obesity: I recommended referral to a registered dietitian. 6  chest pain:   Certainly has some typical components to it. We'll conduct a Agricultural consultant. He's been compliant with Xarelto I think pulmonary embolism is less likely

## 2015-01-20 NOTE — Anesthesia Postprocedure Evaluation (Signed)
  Anesthesia Post-op Note  Patient: Sean Morales  Procedure(s) Performed: Procedure(s): CARDIOVERSION (N/A)  Patient Location: PACU  Anesthesia Type:MAC  Level of Consciousness: awake, alert , oriented and patient cooperative  Airway and Oxygen Therapy: Patient Spontanous Breathing and Patient connected to nasal cannula oxygen  Post-op Pain: none  Post-op Assessment: Post-op Vital signs reviewed, Patient's Cardiovascular Status Stable, Respiratory Function Stable, Patent Airway, No signs of Nausea or vomiting and Pain level controlled              Post-op Vital Signs: Reviewed and stable  Last Vitals:  Filed Vitals:   01/20/15 1030  BP: 122/79  Pulse: 84  Temp:   Resp: 16    Complications: No apparent anesthesia complications

## 2015-01-20 NOTE — Telephone Encounter (Signed)
-----   Message from [name], MD sent at 01/20/2015 10:09 AM EDT ----- Regarding: Needs prescription today  Just cardioverted this patient. Please give amiodarone 200 mg bid x 2 weeks followed by 200 mg daily. Have him continue metoprolol 50 mg bid.  Will need TSH checked in 1 month.  Dr. Bronson Ing

## 2015-01-20 NOTE — Progress Notes (Signed)
Electrical Cardioversion Procedure Note DQUAN CORTOPASSI [id number] [birth year]  Procedure: Electrical Cardioversion Indications: atrial fibrillation  Procedure Details Consent: cardioversion Time Out: Verified patient identification, verified procedure, site/side was marked, verified correct patient position, special equipment/implants available, medications/allergies/relevent history reviewed, required imaging and test results available. 10:03 Patient placed on cardiac monitor, pulse oximetry, supplemental oxygen as necessary.  Sedation given: 9:58 Pacer pads placed anterior chest posterior bac  Cardioverted 2 times Cardioverted at  10:04  Evaluation Findings: Post procedure EKG shows:  Normal sinus rythm Complications: none Patient  did tolerate procedure well.   Roselind Messier 01/20/2015, 10:22 AM

## 2015-01-20 NOTE — Procedures (Signed)
Direct current cardioversion (Dx: atrial fibrillation): Consent was obtained and the procedure was explained in detail to the patient and his wife. With anesthesia assistance, the patient was sedated and appropriate hemodynamic and respiratory monitoring were performed throughout the procedure.  Initially, 150J was attempted but was unsuccessful. 200J was then administered which successfully restored sinus rhythm. There were no complications.  Management: Will reduce metoprolol to 25 mg bid and initiate amiodarone 200 mg bid x 2 weeks and then 200 mg daily. Will check TSH and LFT's in one month and have him follow up with me in the clinic in three weeks.

## 2015-01-20 NOTE — Telephone Encounter (Signed)
Addendum to orders below: reduce metoprolol to 25 mg BID per [name], f/u in 3 weeks 02/01/15 at 10 am in the [place] office and add LFT's to [facility] for labs    I spoke with wife and mailed lab slips

## 2015-01-20 NOTE — Discharge Instructions (Signed)
Electrical Cardioversion, Care After Refer to this sheet in the next few weeks. These instructions provide you with information on caring for yourself after your procedure. Your health care provider may also give you more specific instructions. Your treatment has been planned according to current medical practices, but problems sometimes occur. Call your health care provider if you have any problems or questions after your procedure. WHAT TO EXPECT AFTER THE PROCEDURE After your procedure, it is typical to have the following sensations:  Some redness on the skin where the shocks were delivered. If this is tender, a sunburn lotion or hydrocortisone cream may help.  Possible return of an abnormal heart rhythm within hours or days after the procedure. HOME CARE INSTRUCTIONS  Take medicines only as directed by your health care provider. Be sure you understand how and when to take your medicine.  Learn how to feel your pulse and check it often.  Limit your activity for 48 hours after the procedure or as directed by your health care provider.  Avoid or minimize caffeine and other stimulants as directed by your health care provider. SEEK MEDICAL CARE IF:  You feel like your heart is beating too fast or your pulse is not regular.  You have any questions about your medicines.  You have bleeding that will not stop. SEEK IMMEDIATE MEDICAL CARE IF:  You are dizzy or feel faint.  It is hard to breathe or you feel short of breath.  There is a change in discomfort in your chest.  Your speech is slurred or you have trouble moving an arm or leg on one side of your body.  You get a serious muscle cramp that does not go away.  Your fingers or toes turn cold or blue.   This information is not intended to replace advice given to you by your health care provider. Make sure you discuss any questions you have with your health care provider.   Document Released: 01/07/2013 Document Revised: 04/09/2014  Document Reviewed: 01/07/2013 Elsevier Interactive Patient Education 2016 [employer]   PATIENT INSTRUCTIONS POST-ANESTHESIA  IMMEDIATELY FOLLOWING SURGERY:  Do not drive or operate machinery for the first twenty four hours after surgery.  Do not make any important decisions for twenty four hours after surgery or while taking narcotic pain medications or sedatives.  If you develop intractable nausea and vomiting or a severe headache please notify your doctor immediately.  FOLLOW-UP:  Please make an appointment with your surgeon as instructed. You do not need to follow up with anesthesia unless specifically instructed to do so.  WOUND CARE INSTRUCTIONS (if applicable):  Keep a dry clean dressing on the anesthesia/puncture wound site if there is drainage.  Once the wound has quit draining you may leave it open to air.  Generally you should leave the bandage intact for twenty four hours unless there is drainage.  If the epidural site drains for more than 36-48 hours please call the anesthesia department.  QUESTIONS?:  Please feel free to call your physician or the hospital operator if you have any questions, and they will be happy to assist you.

## 2015-01-20 NOTE — Anesthesia Preprocedure Evaluation (Signed)
Anesthesia Evaluation  Patient identified by MRN, date of birth, ID band Patient awake    Reviewed: Allergy & Precautions, NPO status , Patient's Chart, lab work & pertinent test results, reviewed documented beta blocker date and time   Airway Mallampati: II  TM Distance: >3 FB     Dental  (+) Teeth Intact   Pulmonary sleep apnea , Current Smoker,    breath sounds clear to auscultation       Cardiovascular hypertension, Pt. on home beta blockers and Pt. on medications + dysrhythmias Atrial Fibrillation  Rhythm:Irregular Rate:Normal     Neuro/Psych PSYCHIATRIC DISORDERS Depression    GI/Hepatic GERD  Controlled and Medicated,  Endo/Other    Renal/GU      Musculoskeletal   Abdominal   Peds  Hematology   Anesthesia Other Findings   Reproductive/Obstetrics                             Anesthesia Physical Anesthesia Plan  ASA: III  Anesthesia Plan: MAC   Post-op Pain Management:    Induction: Intravenous  Airway Management Planned: Simple Face Mask  Additional Equipment:   Intra-op Plan:   Post-operative Plan:   Informed Consent: I have reviewed the patients History and Physical, chart, labs and discussed the procedure including the risks, benefits and alternatives for the proposed anesthesia with the patient or authorized representative who has indicated his/her understanding and acceptance.     Plan Discussed with:   Anesthesia Plan Comments:         Anesthesia Quick Evaluation

## 2015-01-21 ENCOUNTER — Encounter (HOSPITAL_COMMUNITY): Payer: Self-pay | Admitting: Cardiovascular Disease

## 2015-01-25 ENCOUNTER — Ambulatory Visit (INDEPENDENT_AMBULATORY_CARE_PROVIDER_SITE_OTHER): Payer: BLUE CROSS/BLUE SHIELD | Admitting: Orthopedic Surgery

## 2015-01-25 ENCOUNTER — Ambulatory Visit (INDEPENDENT_AMBULATORY_CARE_PROVIDER_SITE_OTHER): Payer: BLUE CROSS/BLUE SHIELD

## 2015-01-25 ENCOUNTER — Ambulatory Visit: Payer: BLUE CROSS/BLUE SHIELD

## 2015-01-25 ENCOUNTER — Encounter: Payer: Self-pay | Admitting: Orthopedic Surgery

## 2015-01-25 VITALS — BP 131/79 | Ht 72.0 in | Wt 305.0 lb

## 2015-01-25 DIAGNOSIS — M25562 Pain in left knee: Secondary | ICD-10-CM

## 2015-01-25 DIAGNOSIS — M170 Bilateral primary osteoarthritis of knee: Secondary | ICD-10-CM

## 2015-01-25 DIAGNOSIS — M25561 Pain in right knee: Secondary | ICD-10-CM

## 2015-01-25 NOTE — Progress Notes (Signed)
Patient ID: Sean Morales, male   DOB: 09-Nov-1960, 54 y.o.   MRN: [id number]  Chief Complaint  Patient presents with  . Knee Pain    Knee pain, no injury.                                                                    HPI Sean Morales is a 54 y.o. male.  This 54 year old gentleman just had cardioversion, he is on Xarelto, he Scott chronic A. fib and cardioversion presents with bilateral knee pain status post right knee reconstruction at the age of 87 with what looks like to be a PCL reconstruction. I saw about 10 years ago he was in his 2s he had knees that look like they needed replacing but obviously he was too young  He now presents with constant 5 out of 10 sharp dull throbbing aching stabbing medial knee pain with occasional giving way symptoms. His knee pain is worse when he is walking standing and moving around  Symptoms on review of systems include fatigue and dental problems occasional shortness of breath with cough irregular heartbeat chest pain palpitations chronic leg edema diarrhea frequent urination depression anxiety joint pain muscle weakness back pain stiff joints all other systems were reviewed and were normal    Review of Systems Review of Systems  Past Medical History  Diagnosis Date  . Hypertension   . Reflux   . Atrial fibrillation ([place])   . Depression     Past Surgical History  Procedure Laterality Date  . Appendectomy    . Knee surgety    . Tonsillectomy      age 48  . Cardioversion N/A 01/20/2015    Procedure: CARDIOVERSION;  Surgeon: [name], MD;  Location: [facility];  Service: Cardiovascular;  Laterality: N/A;    Family History  Problem Relation Age of Onset  . Deep vein thrombosis Father   . Stroke Mother     Social History Social History  Substance Use Topics  . Smoking status: Current Every Day Smoker -- 0.50 packs/day for 25 years    Types: Cigarettes  . Smokeless tobacco: Never Used  . Alcohol Use: No     Comment:  rarely    Allergies  Allergen Reactions  . Bee Venom Anaphylaxis    [place] Hornet  . Other     Copyronil per patient  . Penicillins Other (See Comments)    Unknown. Childhood allergy.  . Sulfa Antibiotics Other (See Comments)    Unknown. Childhood allergy.  . Tetracyclines & Related Other (See Comments)    Unknown. Childhood allergy.    Current Outpatient Prescriptions  Medication Sig Dispense Refill  . acetaminophen (TYLENOL) 500 MG tablet Take 1,000-1,500 mg by mouth every 6 (six) hours as needed for headache.    Marland Kitchen acetaminophen-codeine (TYLENOL #3) 300-30 MG tablet Take 1 tablet by mouth every 4 (four) hours as needed for moderate pain.    Marland Kitchen amiodarone (PACERONE) 200 MG tablet Take 200 mg twice a day for 2 weeks, THEN reduce to 200 mg daily 104 tablet 3  . amLODipine (NORVASC) 10 MG tablet Take 1 tablet (10 mg total) by mouth daily.    . hydrochlorothiazide (MICROZIDE) 12.5 MG capsule Take 1 capsule (12.5 mg  total) by mouth daily. 30 capsule 5  . losartan (COZAAR) 100 MG tablet Take 1 tablet by mouth daily.    . metoprolol tartrate (LOPRESSOR) 25 MG tablet Take 1 tablet (25 mg total) by mouth 2 (two) times daily. 180 tablet 3  . omeprazole (PRILOSEC) 40 MG capsule Take 1 capsule by mouth daily.    . rivaroxaban (XARELTO) 20 MG TABS tablet Take 1 tablet (20 mg total) by mouth daily with supper. 30 tablet 1  . ibuprofen (ADVIL,MOTRIN) 200 MG tablet Take 800 mg by mouth every 6 (six) hours as needed for headache.    . [DISCONTINUED] lisinopril (PRINIVIL,ZESTRIL) 10 MG tablet Take 1 tablet (10 mg total) by mouth daily. (Patient not taking: Reported on 04/12/2014) 30 tablet 6   No current facility-administered medications for this visit.       Physical Exam Physical Exam Blood pressure 131/79, height 6' (1.829 m), weight 305 lb (138.347 kg). Appearance, there are no abnormalities in terms of appearance the patient was well-developed and well-nourished. The grooming and hygiene  were normal.  Mental status orientation, there was normal alertness and orientation Mood pleasant Ambulatory status no assistive devices were used but he has a waddling gait Examination of the right and left knee revealed bilateral varus alignment medial joint line tenderness, 120 of knee flexion on the right slight flexion contracture, 115 of knee flexion on the left with slight flexion contraction. Both knees however are stable. Muscle tone and strength in both quadriceps remain normal  His skin shows discoloration in the tibial region secondary to chronic peripheral edema and this is noted in both legs although there is no evidence of ulceration or erythema and no tenderness was noted. Neurologic examination normal sensation Vascular examination normal pulses with warm extremity and normal capillary refill      Data Reviewed [facility] and lateral films were obtained table would not support him to get a sunrise x-ray. I interpreted these films as severe arthritis medial compartment with severe varus alignment noting the tunnels in the right knee from prior reconstructive surgery  Assessment  Bilateral knee pain with osteoarthritis   Plan  I am referring him to [facility] because of his heart condition. At some point he will need knee replacement surgery and with his A. fib recent cardioversion and need for blood thinner his postoperative course and care would be better performed at [facility]

## 2015-01-25 NOTE — Patient Instructions (Signed)
We will refer you to [facility] to Dr Maureen Ralphs for bilateral knee replacement

## 2015-01-26 ENCOUNTER — Telehealth: Payer: Self-pay | Admitting: *Deleted

## 2015-01-26 ENCOUNTER — Other Ambulatory Visit: Payer: Self-pay | Admitting: *Deleted

## 2015-01-26 DIAGNOSIS — M170 Bilateral primary osteoarthritis of knee: Secondary | ICD-10-CM

## 2015-01-26 NOTE — Telephone Encounter (Signed)
Referral faxed to [facility] for Dr Maureen Ralphs or Dr Alvan Dame

## 2015-02-07 NOTE — Telephone Encounter (Signed)
APPT DR Alvan Dame 02/18/15 @ 8:45AM

## 2015-02-10 ENCOUNTER — Encounter: Payer: Self-pay | Admitting: Cardiovascular Disease

## 2015-02-10 ENCOUNTER — Ambulatory Visit (INDEPENDENT_AMBULATORY_CARE_PROVIDER_SITE_OTHER): Payer: BLUE CROSS/BLUE SHIELD | Admitting: Cardiovascular Disease

## 2015-02-10 VITALS — BP 130/85 | HR 62 | Ht 71.0 in | Wt 306.0 lb

## 2015-02-10 DIAGNOSIS — I10 Essential (primary) hypertension: Secondary | ICD-10-CM | POA: Diagnosis not present

## 2015-02-10 DIAGNOSIS — I4891 Unspecified atrial fibrillation: Secondary | ICD-10-CM

## 2015-02-10 DIAGNOSIS — Z79899 Other long term (current) drug therapy: Secondary | ICD-10-CM

## 2015-02-10 DIAGNOSIS — G4733 Obstructive sleep apnea (adult) (pediatric): Secondary | ICD-10-CM | POA: Diagnosis not present

## 2015-02-10 NOTE — Patient Instructions (Signed)
   Labs for LFT, TSH - orders given today.  Office will contact with results via phone or letter.   Follow up in  4 months

## 2015-02-10 NOTE — Progress Notes (Signed)
Patient ID: Sean Morales, male   DOB: 1960/05/28, 54 y.o.   MRN: [id number]      SUBJECTIVE: The patient presents for follow-up after undergoing direct current cardioversion for atrial fibrillation. I started him on amiodarone. TSH and LFT's are pending. Anticoagulated with Xarelto. He underwent a normal nuclear stress test on 01/17/15, EF 60%.  He developed a sinus infection and upper respiratory tract infection in the past 2 weeks. ECG performed in the office today demonstrates rate controlled atrial fibrillation, 58 bpm. He has had some mild episodic dizziness since his sinus infection began. He denies shortness of breath and leg swelling. He would like to undergo bilateral knee replacement surgery as he is now over 300 pounds and used to play tennis and softball on a routine basis.    Review of Systems: As per "subjective", otherwise negative.  Allergies  Allergen Reactions  . Bee Venom Anaphylaxis    [place]et  . Other     Copyronil per patient  . Penicillins Other (See Comments)    Unknown. Childhood allergy.  . Sulfa Antibiotics Other (See Comments)    Unknown. Childhood allergy.  . Tetracyclines & Related Other (See Comments)    Unknown. Childhood allergy.    Current Outpatient Prescriptions  Medication Sig Dispense Refill  . acetaminophen (TYLENOL) 500 MG tablet Take 1,000-1,500 mg by mouth every 6 (six) hours as needed for headache.    Marland Kitchen acetaminophen-codeine (TYLENOL #3) 300-30 MG tablet Take 1 tablet by mouth every 4 (four) hours as needed for moderate pain.    Marland Kitchen amiodarone (PACERONE) 200 MG tablet Take 200 mg twice a day for 2 weeks, THEN reduce to 200 mg daily 104 tablet 3  . amLODipine (NORVASC) 10 MG tablet Take 1 tablet (10 mg total) by mouth daily.    . fluticasone (FLONASE) 50 MCG/ACT nasal spray Place 2 sprays into both nostrils daily as needed.  2  . hydrochlorothiazide (MICROZIDE) 12.5 MG capsule Take 1 capsule (12.5 mg total) by mouth daily. 30  capsule 5  . ibuprofen (ADVIL,MOTRIN) 200 MG tablet Take 800 mg by mouth every 6 (six) hours as needed for headache.    . losartan (COZAAR) 100 MG tablet Take 1 tablet by mouth daily.    . metoprolol tartrate (LOPRESSOR) 25 MG tablet Take 1 tablet (25 mg total) by mouth 2 (two) times daily. 180 tablet 3  . omeprazole (PRILOSEC) 40 MG capsule Take 1 capsule by mouth daily.    Marland Kitchen Phenyleph-Doxylamine-DM-APAP (ALKA SELTZER PLUS PO) Take 1 each by mouth as needed.    . rivaroxaban (XARELTO) 20 MG TABS tablet Take 1 tablet (20 mg total) by mouth daily with supper. 30 tablet 1  . [DISCONTINUED] lisinopril (PRINIVIL,ZESTRIL) 10 MG tablet Take 1 tablet (10 mg total) by mouth daily. (Patient not taking: Reported on 04/12/2014) 30 tablet 6   No current facility-administered medications for this visit.    Past Medical History  Diagnosis Date  . Hypertension   . Reflux   . Atrial fibrillation ([place])   . Depression     Past Surgical History  Procedure Laterality Date  . Appendectomy    . Knee surgety    . Tonsillectomy      age 74  . Cardioversion N/A 01/20/2015    Procedure: CARDIOVERSION;  Surgeon: [name], MD;  Location: [facility];  Service: Cardiovascular;  Laterality: N/A;    Social History   Social History  . Marital Status: Married    Spouse Name:  Holly  . Number of Children: 4  . Years of Education: 14   Occupational History  .      [employer]   Social History Main Topics  . Smoking status: Current Every Day Smoker -- 0.50 packs/day for 25 years    Types: Cigarettes    Start date: 01/08/1977  . Smokeless tobacco: Never Used  . Alcohol Use: No     Comment: rarely  . Drug Use: No  . Sexual Activity: Yes   Other Topics Concern  . Not on file   Social History Narrative   Patient 1 cup of coffee daily,occas. Soft drink     Filed Vitals:   02/10/15 0943  BP: 130/85  Pulse: 62  Height: 5\' 11"  (1.803 m)  Weight: 306 lb (138.801 kg)    PHYSICAL  EXAM General: NAD HEENT: Normal. Neck: No JVD, no thyromegaly. Lungs: Clear to auscultation bilaterally with normal respiratory effort. CV: Normal rate, irregular rhythm, normal S1/S2, no S3, no murmur. No pretibial or periankle edema.    Abdomen: Obese.  Neurologic: Alert and oriented x 3.  Psych: Normal affect. Skin: Normal. Musculoskeletal: No gross deformities. Extremities: No clubbing or cyanosis.   ECG: Most recent ECG reviewed.      ASSESSMENT AND PLAN: 1. Atrial fibrillation s/p DCCV: He has gone back into atrial fibrillation but HR is controlled. He is asymptomatic. May have been precipitated by upper respiratory tract infection. Anticoagulated with Xarelto. On amiodarone as well. Will check TSH and LFT's.  Spoke with him about ablation consultation with Dr. Rayann Heman. For now, will continue medical therapy. If he is still in atrial fibrillation at his next visit, I will d/c amiodarone.  2. Essential HTN: Controlled on amlodpine and HCTZ. No changes.  Dispo: f/u 4 months.  Kate Sable, M.D., F.A.C.C.

## 2015-02-14 ENCOUNTER — Ambulatory Visit: Payer: BLUE CROSS/BLUE SHIELD | Admitting: Physician Assistant

## 2015-02-28 ENCOUNTER — Other Ambulatory Visit: Payer: Self-pay | Admitting: *Deleted

## 2015-02-28 ENCOUNTER — Other Ambulatory Visit: Payer: Self-pay

## 2015-02-28 MED ORDER — HYDROCHLOROTHIAZIDE 12.5 MG PO CAPS: 12.5000 mg | ORAL_CAPSULE | Freq: Every day | ORAL | Status: DC

## 2015-03-01 ENCOUNTER — Encounter: Payer: Self-pay | Admitting: *Deleted

## 2015-03-03 ENCOUNTER — Telehealth: Payer: Self-pay | Admitting: *Deleted

## 2015-03-03 NOTE — Telephone Encounter (Signed)
Error on below, wrong PMD.

## 2015-03-03 NOTE — Telephone Encounter (Signed)
Care everywhere inquiry for recent labs.  Unable to access [facility]) - Dr. Edrick Oh - [facility] without care everywhere ID.

## 2015-04-25 ENCOUNTER — Ambulatory Visit (INDEPENDENT_AMBULATORY_CARE_PROVIDER_SITE_OTHER): Payer: BLUE CROSS/BLUE SHIELD | Admitting: Cardiovascular Disease

## 2015-04-25 ENCOUNTER — Encounter: Payer: Self-pay | Admitting: Cardiovascular Disease

## 2015-04-25 VITALS — BP 130/98 | HR 59 | Ht 72.0 in | Wt 302.0 lb

## 2015-04-25 DIAGNOSIS — R002 Palpitations: Secondary | ICD-10-CM

## 2015-04-25 DIAGNOSIS — R079 Chest pain, unspecified: Secondary | ICD-10-CM

## 2015-04-25 DIAGNOSIS — I10 Essential (primary) hypertension: Secondary | ICD-10-CM

## 2015-04-25 DIAGNOSIS — I4891 Unspecified atrial fibrillation: Secondary | ICD-10-CM | POA: Diagnosis not present

## 2015-04-25 DIAGNOSIS — Z09 Encounter for follow-up examination after completed treatment for conditions other than malignant neoplasm: Secondary | ICD-10-CM | POA: Diagnosis not present

## 2015-04-25 DIAGNOSIS — Z9229 Personal history of other drug therapy: Secondary | ICD-10-CM

## 2015-04-25 DIAGNOSIS — G4733 Obstructive sleep apnea (adult) (pediatric): Secondary | ICD-10-CM

## 2015-04-25 NOTE — Progress Notes (Signed)
Patient ID: Sean Morales, male   DOB: 1961-01-15, 55 y.o.   MRN: [id number]      SUBJECTIVE: The patient presents for follow up of atrial fibrillation. For the past 2 weeks, he has been experiencing rapid palpitations awakening him at night. He did not get the LFTs and TSH checked as I had ordered at his last visit. He sometimes has exertional chest tightness after significantly exerting himself. He underwent a normal nuclear stress test on 01/17/15 with no ischemia or scar, LVEF 60%. Echocardiography on 01/10/14 demonstrated normal left ventricular systolic function, EF 0000000.  ECG today shows atrial fibrillation with a nonspecific T wave abnormality, HR 55 bpm.  Review of Systems: As per "subjective", otherwise negative.  Allergies  Allergen Reactions  . Bee Venom Anaphylaxis    [place]et  . Other     Copyronil per patient  . Penicillins Other (See Comments)    Unknown. Childhood allergy.  . Sulfa Antibiotics Other (See Comments)    Unknown. Childhood allergy.  . Tetracyclines & Related Other (See Comments)    Unknown. Childhood allergy.    Current Outpatient Prescriptions  Medication Sig Dispense Refill  . acetaminophen (TYLENOL) 500 MG tablet Take 1,000-1,500 mg by mouth every 6 (six) hours as needed for headache.    Marland Kitchen acetaminophen-codeine (TYLENOL #3) 300-30 MG tablet Take 1 tablet by mouth every 4 (four) hours as needed for moderate pain.    Marland Kitchen amiodarone (PACERONE) 200 MG tablet Take 200 mg twice a day for 2 weeks, THEN reduce to 200 mg daily 104 tablet 3  . amLODipine (NORVASC) 10 MG tablet Take 1 tablet (10 mg total) by mouth daily.    . fluticasone (FLONASE) 50 MCG/ACT nasal spray Place 2 sprays into both nostrils daily as needed.  2  . hydrochlorothiazide (MICROZIDE) 12.5 MG capsule Take 1 capsule (12.5 mg total) by mouth daily. 30 capsule 3  . ibuprofen (ADVIL,MOTRIN) 200 MG tablet Take 800 mg by mouth every 6 (six) hours as needed for headache.    . losartan  (COZAAR) 100 MG tablet Take 1 tablet by mouth daily.    . metoprolol tartrate (LOPRESSOR) 25 MG tablet Take 1 tablet (25 mg total) by mouth 2 (two) times daily. 180 tablet 3  . omeprazole (PRILOSEC) 40 MG capsule Take 1 capsule by mouth daily.    Marland Kitchen Phenyleph-Doxylamine-DM-APAP (ALKA SELTZER PLUS PO) Take 1 each by mouth as needed.    . rivaroxaban (XARELTO) 20 MG TABS tablet Take 1 tablet (20 mg total) by mouth daily with supper. 30 tablet 1  . [DISCONTINUED] lisinopril (PRINIVIL,ZESTRIL) 10 MG tablet Take 1 tablet (10 mg total) by mouth daily. (Patient not taking: Reported on 04/12/2014) 30 tablet 6   No current facility-administered medications for this visit.    Past Medical History  Diagnosis Date  . Hypertension   . Reflux   . Atrial fibrillation ([place])   . Depression     Past Surgical History  Procedure Laterality Date  . Appendectomy    . Knee surgety    . Tonsillectomy      age 4  . Cardioversion N/A 01/20/2015    Procedure: CARDIOVERSION;  Surgeon: [name], MD;  Location: [facility];  Service: Cardiovascular;  Laterality: N/A;    Social History   Social History  . Marital Status: Married    Spouse Name: [place]  . Number of Children: 4  . Years of Education: 14   Occupational History  .  [employer]   Social History Main Topics  . Smoking status: Current Every Day Smoker -- 0.50 packs/day for 25 years    Types: Cigarettes    Start date: 01/08/1977  . Smokeless tobacco: Never Used  . Alcohol Use: No     Comment: rarely  . Drug Use: No  . Sexual Activity: Yes   Other Topics Concern  . Not on file   Social History Narrative   Patient 1 cup of coffee daily,occas. Soft drink     Filed Vitals:   04/25/15 1317  BP: 130/98  Pulse: 59  Height: 6' (1.829 m)  Weight: 302 lb (136.986 kg)  SpO2: 98%    PHYSICAL EXAM General: NAD HEENT: Normal. Neck: No JVD, no thyromegaly. Lungs: Clear to auscultation bilaterally with normal  respiratory effort. CV: Normal rate, irregular rhythm, normal S1/S2, no S3, no murmur. No pretibial or periankle edema.  Abdomen: Obese.  Neurologic: Alert and oriented x 3.  Psych: Normal affect. Skin: Normal. Musculoskeletal: No gross deformities. Extremities: No clubbing or cyanosis.   ECG: Most recent ECG reviewed.      ASSESSMENT AND PLAN: 1. Palpitations in context of chronic atrial fibrillation: Failed to maintain sinus rhythm after DCCV in 2016. Anticoagulated with Xarelto. Will d/c amiodarone as he remains in atrial fibrillation. I have emphasized the importance of checking LFT's and TSH (complains of abdominal discomfort). Previously spoke with him about consultation with Dr. Rayann Heman for antiarrhythmic therapy vs ablation candidacy. He agrees to proceed. HR controlled on metoprolol 25 mg bid. Will order 2 week event monitor. If he demonstrates significant symptomatic tachycardia, he may warrant pacemaker placement (ablate + pace) so that beta blockers can be increased. Will defer to Dr. Rayann Heman.  2. Essential HTN: Elevated DBP on amlodpine and HCTZ. Needs weight loss (therapeutic lifestyle changes).  Dispo: f/u with EP. Follow up with me in 6 months.   Kate Sable, M.D., F.A.C.C.

## 2015-04-25 NOTE — Patient Instructions (Signed)
   Stop Amiodarone. Continue all other medications.    Labs for CMP, TSH - orders given today. Your physician has recommended that you wear a 2 week event monitor. Event monitors are medical devices that record the heart's electrical activity. Doctors most often [place] these monitors to diagnose arrhythmias. Arrhythmias are problems with the speed or rhythm of the heartbeat. The monitor is a small, portable device. You can wear one while you do your normal daily activities. This is usually used to diagnose what is causing palpitations/syncope (passing out). Office will contact with results via phone or letter.   Electrophysiology referral - Dr. Rayann Heman - [address] office - atrial fibrillation. Your physician wants you to follow up in: 6 months.  You will receive a reminder letter in the mail one-two months in advance.  If you don't receive a letter, please call our office to schedule the follow up appointment

## 2015-04-27 ENCOUNTER — Institutional Professional Consult (permissible substitution): Payer: BLUE CROSS/BLUE SHIELD | Admitting: Internal Medicine

## 2015-04-27 ENCOUNTER — Telehealth: Payer: Self-pay | Admitting: Cardiovascular Disease

## 2015-04-27 NOTE — Telephone Encounter (Signed)
Returned call to patient this morning.  Stated the monitor people had just called him & should be getting in a few days.  Rescheduled appointment with Dr. Rayann Heman for 05/25/2015 at 2:30 at [facility] office.  Patient verbalized understanding.

## 2015-04-27 NOTE — Telephone Encounter (Signed)
Sean Morales called stating that he has not heard from the monitor company.  He was also in question about his appointment With Sean Morales.  Is he suppose to wear the monitor and have readings ready before seeing Sean Morales.  If he does not answer His phone please leave a message .

## 2015-05-02 ENCOUNTER — Ambulatory Visit (INDEPENDENT_AMBULATORY_CARE_PROVIDER_SITE_OTHER): Payer: BLUE CROSS/BLUE SHIELD

## 2015-05-02 DIAGNOSIS — R002 Palpitations: Secondary | ICD-10-CM | POA: Diagnosis not present

## 2015-05-02 DIAGNOSIS — I4891 Unspecified atrial fibrillation: Secondary | ICD-10-CM

## 2015-05-16 ENCOUNTER — Telehealth: Payer: Self-pay | Admitting: *Deleted

## 2015-05-16 NOTE — Telephone Encounter (Signed)
Called patient with test results. No answer. Left message to call back.  

## 2015-05-16 NOTE — Telephone Encounter (Signed)
Spoke with patient. Results given. Patient voiced understanding.

## 2015-05-16 NOTE — Telephone Encounter (Signed)
-----   Message from [name], MD sent at 05/16/2015  2:02 PM EST ----- Ok.

## 2015-05-17 ENCOUNTER — Telehealth: Payer: Self-pay | Admitting: *Deleted

## 2015-05-17 NOTE — Telephone Encounter (Signed)
Notes Recorded by Laurine Blazer, LPN on [id number] at [id number] PM Patient notified. Copy to pmd.

## 2015-05-17 NOTE — Telephone Encounter (Signed)
-----   Message from [name], MD sent at 05/17/2015 11:53 AM EST ----- Rate-controlled atrial fibrillation.

## 2015-05-25 ENCOUNTER — Ambulatory Visit (INDEPENDENT_AMBULATORY_CARE_PROVIDER_SITE_OTHER): Payer: BLUE CROSS/BLUE SHIELD | Admitting: Internal Medicine

## 2015-05-25 ENCOUNTER — Encounter: Payer: Self-pay | Admitting: Internal Medicine

## 2015-05-25 ENCOUNTER — Other Ambulatory Visit: Payer: Self-pay

## 2015-05-25 VITALS — BP 130/84 | HR 67 | Ht 72.0 in | Wt 292.2 lb

## 2015-05-25 DIAGNOSIS — Z9989 Dependence on other enabling machines and devices: Secondary | ICD-10-CM

## 2015-05-25 DIAGNOSIS — I10 Essential (primary) hypertension: Secondary | ICD-10-CM

## 2015-05-25 DIAGNOSIS — G4733 Obstructive sleep apnea (adult) (pediatric): Secondary | ICD-10-CM | POA: Diagnosis not present

## 2015-05-25 DIAGNOSIS — I4891 Unspecified atrial fibrillation: Secondary | ICD-10-CM

## 2015-05-25 NOTE — Patient Instructions (Addendum)
Medication Instructions:  Your physician recommends that you continue on your current medications as directed. Please refer to the Current Medication list given to you today.   Labwork: Your physician recommends that you return for lab work today: Amiodarone level---if this is low will need to be admitted for Tikosym load    Testing/Procedures: Your physician has requested that you have an echocardiogram. Echocardiography is a painless test that uses sound waves to create images of your heart. It provides your doctor with information about the size and shape of your heart and how well your heart's chambers and valves are working. This procedure takes approximately one hour. There are no restrictions for this procedure.    Follow-Up:  Will call you with follow up based on lab results   Any Other Special Instructions Will Be Listed Below (If Applicable).     If you need a refill on your cardiac medications before your next appointment, please call your pharmacy.

## 2015-05-25 NOTE — Progress Notes (Signed)
Electrophysiology Office Note   Date:  05/25/2015   ID:  Sean Morales, DOB [birth year], MRN [id number]  PCP:  [place]  Cardiologist:  Bronson Ing Primary Electrophysiologist: Thompson Grayer, MD    Chief Complaint  Patient presents with  . Atrial Fibrillation     History of Present Illness: Sean Morales is a 55 y.o. male who presents today for electrophysiology evaluation.   He reports initially being diagnosed with atrial fibrillation in October 2015 after presenting to [facility] with symptoms of SOB and palpitations.  He was placed on xarelto and metoprolol.  Echocardiogram demonstrated normal left ventricular systolic function with a left atrial size at the upper limits of normal.  He reports that he was treated with rate control for a year.  He was cardioverted without AAD 10/16 and quickly returned to afib.  He was placed on amiodarone.  He took amiodarone for 3 months but did not have repeat cardioversion.  Because he did not convert to sinus, his amiodarone was discontinued.  He is currently rate controlled.  He reports that he has occasional palpitations.  Over the past few days, he has felt "OK".  He has fatigue and decreased exercise tolerance.  He reports SOB with any exercise.  He reports that he felt "great" after his cardioversion in October. He developed a URI and did not notice when he went back into afib.  He has been diagnosed with sleep apnea and is using CPAP religiously.  Today, he denies symptoms of chest pain, shortness of breath, orthopnea, PND, lower extremity edema, claudication, dizziness, presyncope, syncope, bleeding, or neurologic sequela. The patient is tolerating medications without difficulties and is otherwise without complaint today.    Past Medical History  Diagnosis Date  . Hypertension   . Reflux   . Persistent atrial fibrillation ([place])   . Depression   . Obstructive sleep apnea   . DJD (degenerative joint disease)    . Overweight    Past Surgical History  Procedure Laterality Date  . Appendectomy    . Knee surgety    . Tonsillectomy      age 49  . Cardioversion N/A 01/20/2015    Procedure: CARDIOVERSION;  Surgeon: [name], MD;  Location: [facility];  Service: Cardiovascular;  Laterality: N/A;     Current Outpatient Prescriptions  Medication Sig Dispense Refill  . acetaminophen (TYLENOL) 500 MG tablet Take 1,000-1,500 mg by mouth every 6 (six) hours as needed for headache.    Marland Kitchen acetaminophen-codeine (TYLENOL #3) 300-30 MG tablet Take 1 tablet by mouth as directed.     Marland Kitchen amLODipine (NORVASC) 10 MG tablet Take 1 tablet (10 mg total) by mouth daily.    . fluticasone (FLONASE) 50 MCG/ACT nasal spray Place 2 sprays into both nostrils as directed.   2  . hydrochlorothiazide (MICROZIDE) 12.5 MG capsule Take 1 capsule (12.5 mg total) by mouth daily. 30 capsule 3  . ibuprofen (ADVIL,MOTRIN) 200 MG tablet Take 600 mg by mouth as directed.     Marland Kitchen levofloxacin (LEVAQUIN) 500 MG tablet Take 500 mg by mouth daily.  0  . losartan (COZAAR) 100 MG tablet Take 1 tablet by mouth daily.    . metoprolol tartrate (LOPRESSOR) 25 MG tablet Take 1 tablet (25 mg total) by mouth 2 (two) times daily. 180 tablet 3  . omeprazole (PRILOSEC) 40 MG capsule Take 1 capsule by mouth daily.    . rivaroxaban (XARELTO) 20 MG TABS tablet Take 1 tablet (20  mg total) by mouth daily with supper. 30 tablet 1  . [DISCONTINUED] lisinopril (PRINIVIL,ZESTRIL) 10 MG tablet Take 1 tablet (10 mg total) by mouth daily. (Patient not taking: Reported on 04/12/2014) 30 tablet 6   No current facility-administered medications for this visit.    Allergies:   Bee venom; Other; Penicillins; Sulfa antibiotics; and Tetracyclines & related   Social History:  The patient  reports that he has been smoking Cigarettes.  He started smoking about 38 years ago. He has a 12.5 pack-year smoking history. He has never used smokeless tobacco. He reports that he  does not drink alcohol or use illicit drugs.   Family History:  The patient's  family history includes Deep vein thrombosis in his father; Stroke in his mother.    ROS:  Please see the history of present illness.   All other systems are reviewed and negative.    PHYSICAL EXAM: VS:  BP 130/84 mmHg  Pulse 67  Ht 6' (1.829 m)  Wt 292 lb 3.2 oz (132.541 kg)  BMI 39.62 kg/m2 , BMI Body mass index is 39.62 kg/(m^2). GEN: obese, in no acute distress HEENT: normal Neck: no JVD, carotid bruits, or masses Cardiac: iRRR; no murmurs, rubs, or gallops,no edema  Respiratory:  clear to auscultation bilaterally, normal work of breathing GI: soft, nontender, nondistended, + BS MS: no deformity or atrophy Skin: warm and dry  Neuro:  Strength and sensation are intact Psych: euthymic mood, full affect  EKG:  EKG is ordered today. The ekg ordered today shows afib, V rate 67 bpm, QTc 429 msec   Recent Labs: No results found for requested labs within last 365 days.    Lipid Panel  No results found for: CHOL, TRIG, HDL, CHOLHDL, VLDL, LDLCALC, LDLDIRECT   Wt Readings from Last 3 Encounters:  05/25/15 292 lb 3.2 oz (132.541 kg)  04/25/15 302 lb (136.986 kg)  02/10/15 306 lb (138.801 kg)      Other studies Reviewed: Additional studies/ records that were reviewed today include: Dr Raylene Everts notes, prior hospital records, echo from 2015  Review of the above records today demonstrates: preserved EF, no significant valvular dz   ASSESSMENT AND PLAN:  1.  Longstanding persistent afib He appears to be symptomatic Was recently on amiodarone but did not have cardioversion. Therapeutic strategies for afib including medicine and ablation were discussed in detail with the patient today. I think at this point, his anticipated success with ablation would be low.  I would favor initiation of tikosyn.  Will need to obtain amiodarone level and start once level <0.3 Repeat echo to evaluate LA  size  2. Morbid obesity Lifestyle modification discussed at length  3. HTN Will need to stop hctz prior to initiation of tikosyn  4. OSA Compliance with CPAP encouraged  He is referred to [place] [facility] for initiation of tikosyn.  I would like to see 4 weeks after the medicine is started  This is a difficult situation in a patient with refractory atrial fibrillation.  A high level of decision making was required for this visit.  Current medicines are reviewed at length with the patient today.   The patient does not have concerns regarding his medicines.  The following changes were made today:  none   Signed, Thompson Grayer, MD  05/25/2015 2:50 PM     [address] [place] [address] [phone] (office) [phone] (fax)

## 2015-05-26 LAB — AMIODARONE LEVEL
AMIODARONE LVL: 0.2 ug/mL — AB (ref 1.5–2.5)
Desethylamiodarone: 0.3 ug/mL — ABNORMAL LOW (ref 1.5–2.5)

## 2015-06-08 ENCOUNTER — Encounter (HOSPITAL_COMMUNITY): Payer: Self-pay | Admitting: *Deleted

## 2015-06-08 ENCOUNTER — Ambulatory Visit (INDEPENDENT_AMBULATORY_CARE_PROVIDER_SITE_OTHER): Payer: BLUE CROSS/BLUE SHIELD | Admitting: Pharmacist

## 2015-06-08 ENCOUNTER — Inpatient Hospital Stay (HOSPITAL_COMMUNITY)
Admission: RE | Admit: 2015-06-08 | Discharge: 2015-06-11 | DRG: 310 | Disposition: A | Discharge status: Home / Self Care | Payer: BLUE CROSS/BLUE SHIELD | Source: Ambulatory Visit | Attending: Cardiovascular Disease | Admitting: Cardiovascular Disease

## 2015-06-08 VITALS — Wt 298.0 lb

## 2015-06-08 DIAGNOSIS — K219 Gastro-esophageal reflux disease without esophagitis: Secondary | ICD-10-CM | POA: Diagnosis present

## 2015-06-08 DIAGNOSIS — F17210 Nicotine dependence, cigarettes, uncomplicated: Secondary | ICD-10-CM | POA: Diagnosis present

## 2015-06-08 DIAGNOSIS — I481 Persistent atrial fibrillation: Secondary | ICD-10-CM | POA: Diagnosis present

## 2015-06-08 DIAGNOSIS — E669 Obesity, unspecified: Secondary | ICD-10-CM | POA: Diagnosis present

## 2015-06-08 DIAGNOSIS — Z823 Family history of stroke: Secondary | ICD-10-CM | POA: Diagnosis not present

## 2015-06-08 DIAGNOSIS — Z9889 Other specified postprocedural states: Secondary | ICD-10-CM

## 2015-06-08 DIAGNOSIS — I10 Essential (primary) hypertension: Secondary | ICD-10-CM | POA: Diagnosis present

## 2015-06-08 DIAGNOSIS — Z720 Tobacco use: Secondary | ICD-10-CM | POA: Diagnosis present

## 2015-06-08 DIAGNOSIS — I480 Paroxysmal atrial fibrillation: Secondary | ICD-10-CM

## 2015-06-08 DIAGNOSIS — I4891 Unspecified atrial fibrillation: Secondary | ICD-10-CM | POA: Diagnosis present

## 2015-06-08 DIAGNOSIS — Z888 Allergy status to other drugs, medicaments and biological substances status: Secondary | ICD-10-CM | POA: Diagnosis not present

## 2015-06-08 DIAGNOSIS — R079 Chest pain, unspecified: Secondary | ICD-10-CM | POA: Diagnosis present

## 2015-06-08 DIAGNOSIS — Z880 Allergy status to penicillin: Secondary | ICD-10-CM

## 2015-06-08 DIAGNOSIS — Z79899 Other long term (current) drug therapy: Secondary | ICD-10-CM | POA: Diagnosis not present

## 2015-06-08 DIAGNOSIS — M1990 Unspecified osteoarthritis, unspecified site: Secondary | ICD-10-CM | POA: Diagnosis present

## 2015-06-08 DIAGNOSIS — G4733 Obstructive sleep apnea (adult) (pediatric): Secondary | ICD-10-CM | POA: Diagnosis present

## 2015-06-08 DIAGNOSIS — Z9989 Dependence on other enabling machines and devices: Secondary | ICD-10-CM

## 2015-06-08 DIAGNOSIS — Z882 Allergy status to sulfonamides status: Secondary | ICD-10-CM

## 2015-06-08 DIAGNOSIS — Z9049 Acquired absence of other specified parts of digestive tract: Secondary | ICD-10-CM | POA: Diagnosis not present

## 2015-06-08 DIAGNOSIS — I441 Atrioventricular block, second degree: Secondary | ICD-10-CM

## 2015-06-08 LAB — BASIC METABOLIC PANEL
ANION GAP: 11 (ref 5–15)
Anion gap: 9 (ref 5–15)
BUN: 11 mg/dL (ref 6–20)
BUN: 13 mg/dL (ref 6–20)
CHLORIDE: 103 mmol/L (ref 101–111)
CO2: 26 mmol/L (ref 22–32)
CO2: 25 mmol/L (ref 22–32)
Calcium: 9 mg/dL (ref 8.9–10.3)
Calcium: 8.6 mg/dL — ABNORMAL LOW (ref 8.9–10.3)
Chloride: 108 mmol/L (ref 101–111)
Creatinine, Ser: 1.05 mg/dL (ref 0.61–1.24)
Creatinine, Ser: 1.11 mg/dL (ref 0.61–1.24)
GFR calc Af Amer: >60 mL/min (ref >60)
GFR calc non Af Amer: >60 mL/min (ref >60)
GFR calc non Af Amer: >60 mL/min (ref >60)
GLUCOSE: 92 mg/dL (ref 65–99)
Glucose, Bld: 116 mg/dL — ABNORMAL HIGH (ref 65–99)
Potassium: 4.1 mmol/L (ref 3.5–5.1)
Potassium: 4.2 mmol/L (ref 3.5–5.1)
Sodium: 140 mmol/L (ref 135–145)
Sodium: 142 mmol/L (ref 135–145)

## 2015-06-08 LAB — MAGNESIUM
Magnesium: 2 mg/dL (ref 1.7–2.4)
Magnesium: 2.1 mg/dL (ref 1.7–2.4)

## 2015-06-08 MED ORDER — SODIUM CHLORIDE 0.9 % IV SOLN: 250.0000 mL | INTRAVENOUS | Status: DC | PRN

## 2015-06-08 MED ORDER — SODIUM CHLORIDE 0.9% FLUSH: 3.0000 mL | INTRAVENOUS | Status: DC | PRN

## 2015-06-08 MED ORDER — AMLODIPINE BESYLATE 10 MG PO TABS
10.0000 mg | ORAL_TABLET | Freq: Every day | ORAL | Status: DC
  Administered 2015-06-09 – 2015-06-11 (×3): 10 mg via ORAL
  Filled 2015-06-08 (×2): qty 1
  Filled 2015-06-08: qty 2
  Filled 2015-06-08: qty 1

## 2015-06-08 MED ORDER — ACETAMINOPHEN-CODEINE #3 300-30 MG PO TABS
1.0000 | ORAL_TABLET | Freq: Three times a day (TID) | ORAL | Status: DC | PRN
Start: 2015-06-08 — End: 2015-06-11

## 2015-06-08 MED ORDER — ACETAMINOPHEN 325 MG PO TABS
650.0000 mg | ORAL_TABLET | ORAL | Status: DC | PRN
  Administered 2015-06-09 – 2015-06-10 (×2): 650 mg via ORAL
  Filled 2015-06-08 (×2): qty 2

## 2015-06-08 MED ORDER — ALPRAZOLAM 0.25 MG PO TABS: 0.2500 mg | ORAL_TABLET | Freq: Two times a day (BID) | ORAL | Status: DC | PRN

## 2015-06-08 MED ORDER — PNEUMOCOCCAL VAC POLYVALENT 25 MCG/0.5ML IJ INJ
0.5000 mL | INJECTION | INTRAMUSCULAR | Status: AC
  Administered 2015-06-09: 0.5 mL via INTRAMUSCULAR
  Filled 2015-06-08: qty 0.5
  Filled 2015-06-08: qty 1

## 2015-06-08 MED ORDER — RIVAROXABAN 20 MG PO TABS
20.0000 mg | ORAL_TABLET | Freq: Every day | ORAL | Status: DC
  Administered 2015-06-08 – 2015-06-10 (×3): 20 mg via ORAL
  Filled 2015-06-08 (×3): qty 1

## 2015-06-08 MED ORDER — PANTOPRAZOLE SODIUM 40 MG PO TBEC
40.0000 mg | DELAYED_RELEASE_TABLET | Freq: Every day | ORAL | Status: DC
  Administered 2015-06-09 – 2015-06-11 (×3): 40 mg via ORAL
  Filled 2015-06-08 (×4): qty 1

## 2015-06-08 MED ORDER — INFLUENZA VAC SPLIT QUAD 0.5 ML IM SUSY
0.5000 mL | PREFILLED_SYRINGE | INTRAMUSCULAR | Status: AC
  Administered 2015-06-09: 0.5 mL via INTRAMUSCULAR
  Filled 2015-06-08: qty 0.5

## 2015-06-08 MED ORDER — ONDANSETRON HCL 4 MG/2ML IJ SOLN: 4.0000 mg | Freq: Four times a day (QID) | INTRAMUSCULAR | Status: DC | PRN

## 2015-06-08 MED ORDER — SODIUM CHLORIDE 0.9% FLUSH
3.0000 mL | Freq: Two times a day (BID) | INTRAVENOUS | Status: DC
  Administered 2015-06-08 – 2015-06-11 (×6): 3 mL via INTRAVENOUS

## 2015-06-08 MED ORDER — DOFETILIDE 500 MCG PO CAPS
500.0000 ug | ORAL_CAPSULE | Freq: Two times a day (BID) | ORAL | Status: DC
  Administered 2015-06-08 – 2015-06-11 (×6): 500 ug via ORAL
  Filled 2015-06-08 (×6): qty 1

## 2015-06-08 MED ORDER — ZOLPIDEM TARTRATE 5 MG PO TABS
5.0000 mg | ORAL_TABLET | Freq: Every evening | ORAL | Status: DC | PRN
  Filled 2015-06-08: qty 1

## 2015-06-08 MED ORDER — METOPROLOL TARTRATE 25 MG PO TABS
25.0000 mg | ORAL_TABLET | Freq: Two times a day (BID) | ORAL | Status: DC
Start: 2015-06-08 — End: 2015-06-11
  Administered 2015-06-08 – 2015-06-11 (×6): 25 mg via ORAL
  Filled 2015-06-08 (×6): qty 1

## 2015-06-08 MED ORDER — IBUPROFEN 600 MG PO TABS: 600.0000 mg | ORAL_TABLET | Freq: Three times a day (TID) | ORAL | Status: DC | PRN

## 2015-06-08 MED ORDER — LOSARTAN POTASSIUM 50 MG PO TABS
100.0000 mg | ORAL_TABLET | Freq: Every day | ORAL | Status: DC
  Administered 2015-06-09 – 2015-06-11 (×3): 100 mg via ORAL
  Filled 2015-06-08 (×3): qty 2

## 2015-06-08 NOTE — Progress Notes (Signed)
Patient has home CPAP and can place self on and off.  

## 2015-06-08 NOTE — Progress Notes (Signed)
Patient ID: Sean Morales                 DOB: 1960-07-09, 55 yo                         MRN: [id number]     HPI: [name] is a patient of Dr. Rayann Heman who is seen today for Tikosyn initiation. He was diagnosed with afib in October 2015 after presenting to [facility] with symptoms of SOB and palpitations. He was placed on Xarelto and metoprolol and treated with rate control for a year. He was cardioverted without AAD 10/16 and quickly returned to afib. He was placed on amiodarone and took it for 3 months but did not have repeat cardioversion. Amiodarone was discontinued since he did not convert to sinus rhythm. Due to patient's fatigue and decreased exercise tolerance, it was decided to pursue Tikosyn initiation.  Pt does report some mild chest pain today. He states it is usually a 1 out of 10 and today it is a 3 out of 10. He thinks it may be due to nerves over hospital admission for Tikosyn. EKG reviewed by Dr. Curt Bears who did not find any concerning EKG changes.  Discussed potential side effects of Tikosyn with patient. He is aware of the importance of compliance and risk of QTc prolongation. He will call the office if he misses more than 2 doses in a row. Reviewed pt's medication list.His HCTZ was stopped over a week ago. An amiodarone level was drawn on 2/22 and came back at 0.2 - acceptable for Tikosyn initiation (goal = less than 0.3).  He is currently not taking any QTc prolongating or contraindicated medications. He has been appropriately anticoagulated with Xarelto and has not missed any doses within the past 30 days.   EKG reviewed by Dr. Curt Bears. Atrial fibrillation. Vent rate 71 bpm. QTc 450msec, QRS 79msec.  Labs: K 4.1, Mg 2, SCr 1.05, weight 298 lbs, CrCl > 100.   Past Medical History  Diagnosis Date  . Hypertension   . Reflux   . Persistent atrial fibrillation ([place])   . Depression   . Obstructive sleep apnea   . DJD (degenerative joint disease)   . Overweight      Current Outpatient Prescriptions on File Prior to Visit  Medication Sig Dispense Refill  . acetaminophen (TYLENOL) 500 MG tablet Take 1,000-1,500 mg by mouth every 6 (six) hours as needed for headache.    Marland Kitchen acetaminophen-codeine (TYLENOL #3) 300-30 MG tablet Take 1 tablet by mouth as directed.     Marland Kitchen amLODipine (NORVASC) 10 MG tablet Take 1 tablet (10 mg total) by mouth daily.    . fluticasone (FLONASE) 50 MCG/ACT nasal spray Place 2 sprays into both nostrils as directed.   2  . hydrochlorothiazide (MICROZIDE) 12.5 MG capsule Take 1 capsule (12.5 mg total) by mouth daily. 30 capsule 3  . ibuprofen (ADVIL,MOTRIN) 200 MG tablet Take 600 mg by mouth as directed.     Marland Kitchen levofloxacin (LEVAQUIN) 500 MG tablet Take 500 mg by mouth daily.  0  . losartan (COZAAR) 100 MG tablet Take 1 tablet by mouth daily.    . metoprolol tartrate (LOPRESSOR) 25 MG tablet Take 1 tablet (25 mg total) by mouth 2 (two) times daily. 180 tablet 3  . omeprazole (PRILOSEC) 40 MG capsule Take 1 capsule by mouth daily.    . rivaroxaban (XARELTO) 20 MG TABS tablet Take 1 tablet (20 mg total)  by mouth daily with supper. 30 tablet 1  . [DISCONTINUED] lisinopril (PRINIVIL,ZESTRIL) 10 MG tablet Take 1 tablet (10 mg total) by mouth daily. (Patient not taking: Reported on 04/12/2014) 30 tablet 6   No current facility-administered medications on file prior to visit.    Allergies  Allergen Reactions  . Bee Venom Anaphylaxis    [place] Hornet  . Other     Copyronil per patient  . Penicillins Other (See Comments)    Unknown. Childhood allergy.  . Sulfa Antibiotics Other (See Comments)    Unknown. Childhood allergy.  . Tetracyclines & Related Other (See Comments)    Unknown. Childhood allergy.     Assessment/Plan:  1. Atrial fibrillation - EKG reviewed by Dr. Curt Bears and ok for Tikosyn initiation. Labs reviewed - K and Mg acceptable, CrCl using actual body weight >100 . Anticipated starting dose of Tikosyn is 568mcg BID.  Patient aware to report to admitting.    Megan E. Supple, PharmD, [place] [id number] [address], [place], [place] [address] Phone: [phone]; Fax: [phone] 06/08/2015 11:25 AM

## 2015-06-08 NOTE — Progress Notes (Signed)
Pharmacy Review for Dofetilide (Tikosyn) Initiation  Admit Complaint: 55 y.o. male admitted 06/08/2015 with atrial fibrillation to be initiated on dofetilide.   Assessment:  Patient Exclusion Criteria: If any screening criteria checked as "Yes", then  patient  should NOT receive dofetilide until criteria item is corrected. If "Yes" please indicate correction plan.  YES  NO Patient  Exclusion Criteria Correction Plan  []  [x]  Baseline QTc interval is greater than or equal to 440 msec. IF above YES box checked dofetilide contraindicated unless patient has ICD; then may proceed if QTc 500-550 msec or with known ventricular conduction abnormalities may proceed with QTc 550-600 msec. QTc =     []  [x]  Magnesium level is less than 1.8 mEq/l : Last magnesium:  Lab Results  Component Value Date   MG 2.0 06/08/2015         []  [x]  Potassium level is less than 4 mEq/l : Last potassium:  Lab Results  Component Value Date   K 4.1 06/08/2015         []  [x]  Patient is known or suspected to have a digoxin level greater than 2 ng/ml: No results found for: DIGOXIN    []  [x]  Creatinine clearance less than 20 ml/min (calculated using Cockcroft-Gault, actual body weight and serum creatinine): Estimated Creatinine Clearance: 113.6 mL/min (by C-G formula based on Cr of 1.05).    []  [x]  Patient has received drugs known to prolong the QT intervals within the last 48 hours (phenothiazines, tricyclics or tetracyclic antidepressants, erythromycin, H-1 antihistamines, cisapride, fluoroquinolones, azithromycin). Drugs not listed above may have an, as yet, undetected potential to prolong the QT interval, updated information on QT prolonging agents is available at this website:QT prolonging agents   []  [x]  Patient received a dose of hydrochlorothiazide (Oretic) alone or in any combination including triamterene (Dyazide, Maxzide) in the last 48 hours.   []  [x]  Patient received a medication known to increase dofetilide  plasma concentrations prior to initial dofetilide dose:  . Trimethoprim (Primsol, Proloprim) in the last 36 hours . Verapamil (Calan, Verelan) in the last 36 hours or a sustained release dose in the last 72 hours . Megestrol (Megace) in the last 5 days  . Cimetidine (Tagamet) in the last 6 hours . Ketoconazole (Nizoral) in the last 24 hours . Itraconazole (Sporanox) in the last 48 hours  . Prochlorperazine (Compazine) in the last 36 hours    []  [x]  Patient is known to have a history of torsades de pointes; congenital or acquired long QT syndromes.   []  [x]  Patient has received a Class 1 antiarrhythmic with less than 2 half-lives since last dose. (Disopyramide, Quinidine, Procainamide, Lidocaine, Mexiletine, Flecainide, Propafenone)   []  [x]  Patient has received amiodarone therapy in the past 3 months or amiodarone level is greater than 0.3 ng/ml.    Patient has been appropriately anticoagulated with Xarelto.  Ordering provider was confirmed at [email or web address] if they are not listed on the [place] Prescribers list.  Goal of Therapy: Follow renal function, electrolytes, potential drug interactions, and dose adjustment. Provide education and 1 week supply at discharge.  Plan:  [x]   Physician selected initial dose within range recommended for patients level of renal function - will monitor for response.  []   Physician selected initial dose outside of range recommended for patients level of renal function - will discuss if the dose should be altered at this time.   Select One Calculated CrCl  Dose q12h  [x]  > 60 ml/min 500 mcg  []   40-60 ml/min 250 mcg  []  20-40 ml/min 125 mcg   2. Follow up QTc after the first 5 doses, renal function, electrolytes (K & Mg) daily x 3     days, dose adjustment, success of initiation and facilitate 1 week discharge supply as     clinically indicated.  3. Initiate Tikosyn education video (Call [phone] and ask for video # 116).  4. Place  Enrollment Form on the chart for discharge supply of dofetilide.  Andrey Cota. Diona Foley, PharmD, [place] Clinical Pharmacist Pager [phone] 6:50 PM 06/08/2015

## 2015-06-08 NOTE — H&P (Addendum)
Patient ID: Sean Morales MRN: [id number], DOB/AGE: February 18, 1961   Admit date: 06/08/2015   Primary Physician: [place] Primary Cardiologist: Dr Michael Litter  HPI:    He was diagnosed with afib in October 2015 after presenting to [facility] with symptoms of SOB and palpitations. Myoview was negative, echo in 10/15. He was placed on Xarelto and metoprolol and treated with rate control for a year. He was cardioverted without AAD 10/16 and quickly returned to afib. He was then placed on amiodarone and took it for 3 months but did not have repeat cardioversion. Amiodarone was discontinued since he did not convert to sinus rhythm. The patient continued to have  fatigue and decreased exercise tolerance, and was referred to Dr Sean Morales who saw him 05/25/15. It was decided to pursue Tikosyn initiation. The pt's amiodarone was stopped. His amiodarone level on 05/25/15 was 0.2.              He was seen by the pharmacist at the office today. She discussed potential side effects of Tikosyn with patient. He is aware of the importance of compliance and risk of QTc prolongation. Reviewed pt's medication list.His HCTZ was stopped over a week ago. An amiodarone level was drawn on 2/22 and came back at 0.2 - acceptable for Tikosyn initiation (goal = less than 0.3). He is currently not taking any QTc prolongation or contraindicated medications. He has been appropriately anticoagulated with Xarelto and has not missed any doses within the past 30 days.  He has noted some mild chest pain recently, mid-Rt side, non exertional, lasting a minute or so. No associated nausea, vomiting, or diaphoresis. He attributes the pain to "nerves". EKG done i the office today was reviewed by Dr. Curt Bears. Atrial fibrillation. Vent rate 71 bpm. QTc 450msec, QRS 65msec. No acute changes. The pt is admitted now for Tikosyn loading.    Problem List: Past Medical History  Diagnosis Date  . Hypertension   .  Reflux   . Persistent atrial fibrillation ([place])   . Depression   . Obstructive sleep apnea   . DJD (degenerative joint disease)   . Overweight     Past Surgical History  Procedure Laterality Date  . Appendectomy    . Knee surgety    . Tonsillectomy      age 13  . Cardioversion N/A 01/20/2015    Procedure: CARDIOVERSION;  Surgeon: [name], MD;  Location: [facility];  Service: Cardiovascular;  Laterality: N/A;     Allergies:  Allergies  Allergen Reactions  . Bee Venom Anaphylaxis    [place] Hornet  . Other     Copyronil per patient  . Penicillins Other (See Comments)    Unknown. Childhood allergy. Has patient had a PCN reaction causing immediate rash, facial/tongue/throat swelling, SOB or lightheadedness with hypotension: Unknown Has patient had a PCN reaction causing severe rash involving mucus membranes or skin necrosis: Unknown Has patient had a PCN reaction that required hospitalization: Unknown Has patient had a PCN reaction occurring within the last 10 years: No If all of the above answers are "NO", then may proceed with Cephalosporin use.   . Sulfa Antibiotics Other (See Comments)    Unknown. Childhood allergy.  . Tetracyclines & Related Other (See Comments)    Unknown. Childhood allergy.     Home Medications Prior to Admission medications   Medication Sig Start Date End Date Taking? Authorizing Provider  acetaminophen (TYLENOL) 500 MG tablet Take 1,000-1,500 mg by mouth  every 6 (six) hours as needed for headache.   Yes Historical Provider, MD  acetaminophen-codeine (TYLENOL #3) 300-30 MG tablet Take 1 tablet by mouth every 8 (eight) hours as needed for severe pain.    Yes Historical Provider, MD  amLODipine (NORVASC) 10 MG tablet Take 1 tablet (10 mg total) by mouth daily. 01/28/14  Yes [name], MD  ibuprofen (ADVIL,MOTRIN) 200 MG tablet Take 600 mg by mouth every 8 (eight) hours as needed for headache.    Yes Historical Provider, MD  losartan  (COZAAR) 100 MG tablet Take 100 mg by mouth daily.  04/05/14  Yes Historical Provider, MD  metoprolol tartrate (LOPRESSOR) 25 MG tablet Take 1 tablet (25 mg total) by mouth 2 (two) times daily. 01/20/15  Yes [name], MD  omeprazole (PRILOSEC) 40 MG capsule Take 1 capsule by mouth daily. 12/26/13  Yes Historical Provider, MD  rivaroxaban (XARELTO) 20 MG TABS tablet Take 1 tablet (20 mg total) by mouth daily with supper. 01/11/14  Yes Estela Leonie Green, MD     Family History  Problem Relation Age of Onset  . Deep vein thrombosis Father   . Stroke Mother      Social History   Social History  . Marital Status: Married    Spouse Name: [place]  . Number of Children: 4  . Years of Education: 14   Occupational History  .      [employer]   Social History Main Topics  . Smoking status: Current Every Day Smoker -- 0.50 packs/day for 25 years    Types: Cigarettes    Start date: 01/08/1977  . Smokeless tobacco: Never Used  . Alcohol Use: No     Comment: rarely  . Drug Use: No  . Sexual Activity: Yes   Other Topics Concern  . Not on file   Social History Narrative   Lives in [place] (near [place]) with wife.   Patient 1 cup of coffee daily,occas. Soft drink   Technical sales engineer for [employer]        Review of Systems: General: negative for chills, fever, night sweats or weight changes.  Cardiovascular: negative for chest pain, dyspnea on exertion, edema, orthopnea, palpitations, paroxysmal nocturnal dyspnea or shortness of breath HEENT: negative for any visual disturbances, blindness, glaucoma Dermatological: negative for rash Respiratory: negative for cough, hemoptysis, or wheezing Urologic: negative for hematuria or dysuria Abdominal: negative for nausea, vomiting, diarrhea, bright red blood per rectum, melena, or hematemesis Neurologic: negative for visual changes, syncope, or dizziness Musculoskeletal: negative for back pain, joint  pain, or swelling Psych: cooperative and appropriate All other systems reviewed and are otherwise negative except as noted above.  Physical Exam: Blood pressure 144/94, pulse 62, temperature 98.9 F (37.2 C), temperature source Oral, resp. rate 20, height 6' (1.829 m), weight 293 lb 12.8 oz (133.267 kg), SpO2 99 %.  General appearance: alert, cooperative, no distress and moderately obese Neck: no carotid bruit and no JVD Lungs: clear to auscultation bilaterally Heart: irregularly irregular rhythm Abdomen: obese non tender Extremities: extremities normal, atraumatic, no cyanosis or edema Pulses: 2+ and symmetric Skin: Skin color, texture, turgor normal. No rashes or lesions Neurologic: Grossly normal    Labs:   Results for orders placed or performed in visit on 06/08/15 (from the past 24 hour(s))  Basic Metabolic Panel (BMET)     Status: None   Collection Time: 06/08/15 11:13 AM  Result Value Ref Range   Sodium 140 135 -  145 mmol/L   Potassium 4.1 3.5 - 5.1 mmol/L   Chloride 103 101 - 111 mmol/L   CO2 26 22 - 32 mmol/L   Glucose, Bld 92 65 - 99 mg/dL   BUN 11 6 - 20 mg/dL   Creatinine, Ser 1.05 0.61 - 1.24 mg/dL   Calcium 9.0 8.9 - 10.3 mg/dL   GFR calc non Af Amer >60 >60 mL/min   GFR calc Af Amer >60 >60 mL/min   Anion gap 11 5 - 15  Magnesium     Status: None   Collection Time: 06/08/15 11:13 AM  Result Value Ref Range   Magnesium 2.0 1.7 - 2.4 mg/dL     Radiology/Studies: No results found.  [id number]  ASSESSMENT AND PLAN:  Principal Problem:   Atrial fibrillation (HCC) Active Problems:   HTN (hypertension)   OSA on CPAP   Chest pain   GERD (gastroesophageal reflux disease)   Tobacco use   PLAN: Pt seen by Dr Stanford Breed and myself. Initiate Tikosyn orders.    SignedErlene Quan, PA-C 06/08/2015, 6:25 PM [phone]  As above, patient seen and examined. Briefly he is a 55 year old male with past medical history of paroxysmal atrial fibrillation,  hypertension, Obstructive sleep apneaAdmitted for tikosyn. Patient denies dyspnea, palpitations but does have some fatigue. Occasional mild chest pain nonexertional. LV function is normal. Amiodarone level 0.2. Potassium 4.2 and magnesium 2.1. Electrocardiogram shows atrial fibrillation with normal QT Of 0.4. Discontinue hydrochlorothiazide. Initiate tikosyn 500 mg twice a day. Follow electrolytes, telemetry and serial ECGs. Continue beta blocker for rate control. Continue xarelto and patient states he has been compliant. CHADSvasc 1. Kirk Ruths

## 2015-06-09 ENCOUNTER — Other Ambulatory Visit (HOSPITAL_COMMUNITY): Payer: BLUE CROSS/BLUE SHIELD

## 2015-06-09 DIAGNOSIS — I481 Persistent atrial fibrillation: Principal | ICD-10-CM

## 2015-06-09 LAB — BASIC METABOLIC PANEL
Anion gap: 12 (ref 5–15)
BUN: 11 mg/dL (ref 6–20)
CO2: 24 mmol/L (ref 22–32)
Calcium: 8.2 mg/dL — ABNORMAL LOW (ref 8.9–10.3)
Chloride: 106 mmol/L (ref 101–111)
Creatinine, Ser: 1.12 mg/dL (ref 0.61–1.24)
GFR calc Af Amer: >60 mL/min (ref >60)
GFR calc non Af Amer: >60 mL/min (ref >60)
Glucose, Bld: 94 mg/dL (ref 65–99)
Potassium: 3.7 mmol/L (ref 3.5–5.1)
Sodium: 142 mmol/L (ref 135–145)

## 2015-06-09 LAB — MAGNESIUM: Magnesium: 2 mg/dL (ref 1.7–2.4)

## 2015-06-09 MED ORDER — POTASSIUM CHLORIDE CRYS ER 20 MEQ PO TBCR: 40.0000 meq | EXTENDED_RELEASE_TABLET | Freq: Once | ORAL | Status: DC

## 2015-06-09 MED ORDER — POTASSIUM CHLORIDE CRYS ER 20 MEQ PO TBCR
40.0000 meq | EXTENDED_RELEASE_TABLET | Freq: Once | ORAL | Status: AC
  Administered 2015-06-09: 40 meq via ORAL
  Filled 2015-06-09: qty 2

## 2015-06-09 NOTE — Progress Notes (Signed)
SUBJECTIVE: The patient is doing well today, he can tell he is back in rhythm, just feels better, chest discomfort is gone.  At this time, he denies chest pain, shortness of breath, or any new concerns.  Marland Kitchen amLODipine  10 mg Oral Daily  . dofetilide  500 mcg Oral BID  . Influenza vac split quadrivalent PF  0.5 mL Intramuscular Tomorrow-1000  . losartan  100 mg Oral Daily  . metoprolol tartrate  25 mg Oral BID  . pantoprazole  40 mg Oral Daily  . pneumococcal 23 valent vaccine  0.5 mL Intramuscular Tomorrow-1000  . rivaroxaban  20 mg Oral Q supper  . sodium chloride flush  3 mL Intravenous Q12H      OBJECTIVE: Physical Exam: Filed Vitals:   06/08/15 2013 06/09/15 0018 06/09/15 0643 06/09/15 0818  BP: 135/89 129/80 136/90 136/88  Pulse: 61 62 65 65  Temp: 98.4 F (36.9 C)  98.5 F (36.9 C)   TempSrc: Oral Oral Oral   Resp: 20 20 20 20   Height:      Weight:   293 lb 1.6 oz (132.949 kg)   SpO2: 97% 96% 98% 97%    Intake/Output Summary (Last 24 hours) at 06/09/15 0846 Last data filed at 06/09/15 0655  Gross per 24 hour  Intake    480 ml  Output      3 ml  Net    477 ml    Telemetry reveals sinus rhythm, 1st degree AVBlock, APC's, blocked APC's  GEN- The patient is well appearing, alert and oriented x 3 today.   Head- normocephalic, atraumatic Eyes-  Sclera clear, conjunctiva pink Ears- hearing intact Oropharynx- clear Neck- supple, no JVP Lungs- Clear to ausculation bilaterally, normal work of breathing Heart- Regular rate and rhythm, no significant murmurs, no rubs or gallops GI- soft, NT, ND Extremities- no clubbing, cyanosis, or edema Skin- no rash or lesion Psych- euthymic mood, full affect Neuro- no gross deficits appreciated  LABS: Basic Metabolic Panel:  Recent Labs  06/08/15 1905 06/09/15 0409  NA 142 142  K 4.2 3.7  CL 108 106  CO2 25 24  GLUCOSE 116* 94  BUN 13 11  CREATININE 1.11 1.12  CALCIUM 8.6* 8.2*  MG 2.1 2.0    ASSESSMENT AND  PLAN:   1. Persistent AFib, admitted for Tikosyn initiation     S/p 1st dose last night, converted to SR overnight, HR 50's-60's He can tell he is in rhythm, states with the AF he feels a discomfort/feeling in his chest that is gone (negative stress myoview Oct 2016)      QTc/EKG reviewed with Dr. Curt Bears, stable to continue     K+ 3.7 will replace     Mag 2.0     Creat 1.12 (Calc Cr. Cl 141.74), continue 573mcg BID CHA2DS2Vasc is at least one on Xarelto  2. HTN     Stable, no need for changes  3. Obesity/OSA      Reports compliance with CPAP  Tommye Standard, PA-C 06/09/2015 8:46 AM  I have seen and examined this patient with Tommye Standard.  Agree with above, note added to reflect my findings.  On exam, regular rhythm, no murmurs, lungs clear.  Has long fist degree AV block currently.  Had first dose of tikosyn and converted to sinus rhythm.  Will continue at 500 mcg BID and continue to monitor QTc for lengthening.  Will monitor electrolytes and replete as needed.    Will M. Camnitz MD  06/09/2015 3:46 PM

## 2015-06-09 NOTE — Progress Notes (Signed)
Pt in NSR at approximately 0030 this AM per CCMD. EKG done and confirmed NSR.

## 2015-06-09 NOTE — Progress Notes (Signed)
Benefit check for Tikosyn            S/W ALEX @ [facility] # [id number]   TIKOSYN 500 MCG BID FOR 30 DAY SUPPLY   COVER- YES  CO-PAY- $107.17  PRIOR APPROVAL - YES # [phone]  PHARMACY : ANY RETAIL    DOFETILIDE 500 MCG BID 30 DAY SUPPLY   COVER- YES  CO-PAY - $ 77.13  PRIOR APPROVAL - YES # [phone]  PHARMACY : ANY RETAIL

## 2015-06-10 ENCOUNTER — Telehealth: Payer: Self-pay | Admitting: Internal Medicine

## 2015-06-10 DIAGNOSIS — I4891 Unspecified atrial fibrillation: Secondary | ICD-10-CM

## 2015-06-10 LAB — BASIC METABOLIC PANEL
Anion gap: 11 (ref 5–15)
BUN: 11 mg/dL (ref 6–20)
CO2: 24 mmol/L (ref 22–32)
Calcium: 8.4 mg/dL — ABNORMAL LOW (ref 8.9–10.3)
Chloride: 106 mmol/L (ref 101–111)
Creatinine, Ser: 1.02 mg/dL (ref 0.61–1.24)
GFR calc Af Amer: >60 mL/min (ref >60)
GFR calc non Af Amer: >60 mL/min (ref >60)
Glucose, Bld: 86 mg/dL (ref 65–99)
Potassium: 3.9 mmol/L (ref 3.5–5.1)
Sodium: 141 mmol/L (ref 135–145)

## 2015-06-10 LAB — MAGNESIUM: Magnesium: 2.1 mg/dL (ref 1.7–2.4)

## 2015-06-10 MED ORDER — POTASSIUM CHLORIDE CRYS ER 20 MEQ PO TBCR
30.0000 meq | EXTENDED_RELEASE_TABLET | Freq: Once | ORAL | Status: AC
  Administered 2015-06-10: 30 meq via ORAL
  Filled 2015-06-10: qty 1

## 2015-06-10 NOTE — Progress Notes (Signed)
Patient has home CPAP, can place self on and off.  

## 2015-06-10 NOTE — Progress Notes (Signed)
Pt's CHADs VASc score is 1 for HTN  Jefferey Lippmann PA-C 06/10/2015 10:33 AM

## 2015-06-10 NOTE — Telephone Encounter (Signed)
New message      Pt was admitted for a tikyson overload.  He is scheduled to go home tomorrow. Prior to his admission, he was scheduled to have an echo at the office.  There is no echo scheduled at the hosp (according to the wife).  Will he still need to have an echo?  If yes, he is scheduled to go home tomorrow.  Please call

## 2015-06-10 NOTE — Discharge Instructions (Signed)
Information on my medicine - XARELTO® (Rivaroxaban) ° °This medication education was reviewed with me or my healthcare representative as part of my discharge preparation.   ° °Why was Xarelto® prescribed for you? °Xarelto® was prescribed for you to reduce the risk of a blood clot forming that can cause a stroke if you have a medical condition called atrial fibrillation (a type of irregular heartbeat). ° °What do you need to know about xarelto® ? °Take your Xarelto® 20 mg ONCE DAILY at the same time every day with your evening meal. °If you have difficulty swallowing the tablet whole, you may crush it and mix in applesauce just prior to taking your dose. ° °Take Xarelto® exactly as prescribed by your doctor and DO NOT stop taking Xarelto® without talking to the doctor who prescribed the medication.  Stopping without other stroke prevention medication to take the place of Xarelto® may increase your risk of developing a clot that causes a stroke.  Refill your prescription before you run out. ° °After discharge, you should have regular check-up appointments with your healthcare provider that is prescribing your Xarelto®.  In the future your dose may need to be changed if your kidney function or weight changes by a significant amount. ° °What do you do if you miss a dose? °If you are taking Xarelto® ONCE DAILY and you miss a dose, take it as soon as you remember on the same day then continue your regularly scheduled once daily regimen the next day. Do not take two doses of Xarelto® at the same time or on the same day.  ° °Important Safety Information °A possible side effect of Xarelto® is bleeding. You should call your healthcare provider right away if you experience any of the following: °? Bleeding from an injury or your nose that does not stop. °? Unusual colored urine (red or dark brown) or unusual colored stools (red or black). °? Unusual bruising for unknown reasons. °? A serious fall or if you hit your head (even  if there is no bleeding). ° °Some medicines may interact with Xarelto® and might increase your risk of bleeding while on Xarelto®. To help avoid this, consult your healthcare provider or pharmacist prior to using any new prescription or non-prescription medications, including herbals, vitamins, non-steroidal anti-inflammatory drugs (NSAIDs) and supplements. ° °This website has more information on Xarelto®: www.xarelto.com. ° ° °

## 2015-06-10 NOTE — Telephone Encounter (Signed)
Spoke with wife and let her know that a 4 week post hospital appointment is needed and will obtain an echo prior to that appointment

## 2015-06-10 NOTE — Progress Notes (Signed)
    SUBJECTIVE: The patient is doing well today, continues to be in rhythm, just feels better, chest discomfort is gone.  At this time, he denies chest pain, shortness of breath, or any new concerns.  Marland Kitchen amLODipine  10 mg Oral Daily  . dofetilide  500 mcg Oral BID  . losartan  100 mg Oral Daily  . metoprolol tartrate  25 mg Oral BID  . pantoprazole  40 mg Oral Daily  . rivaroxaban  20 mg Oral Q supper  . sodium chloride flush  3 mL Intravenous Q12H      OBJECTIVE: Physical Exam: Filed Vitals:   06/09/15 1957 06/10/15 0450 06/10/15 0837 06/10/15 1253  BP: 136/88 125/85 129/88 123/78  Pulse: 61 61 71 56  Temp: 98.4 F (36.9 C) 97.8 F (36.6 C)  98.3 F (36.8 C)  TempSrc: Oral Oral  Oral  Resp: 20 20 18 18   Height:      Weight:  293 lb 11.2 oz (133.221 kg)    SpO2: 97% 96% 96% 95%    Intake/Output Summary (Last 24 hours) at 06/10/15 1430 Last data filed at 06/10/15 0948  Gross per 24 hour  Intake    840 ml  Output   2475 ml  Net  -1635 ml    Telemetry reveals sinus rhythm, 1st degree AVBlock, APC's, blocked APC's  GEN- The patient is well appearing, alert and oriented x 3 today.   Head- normocephalic, atraumatic Eyes-  Sclera clear, conjunctiva pink Ears- hearing intact Oropharynx- clear Neck- supple, no JVP Lungs- Clear to ausculation bilaterally, normal work of breathing Heart- Regular rate and rhythm, no significant murmurs, no rubs or gallops GI- soft, NT, ND Extremities- no clubbing, cyanosis, or edema Skin- no rash or lesion Psych- euthymic mood, full affect Neuro- no gross deficits appreciated  LABS: Basic Metabolic Panel:  Recent Labs  06/09/15 0409 06/10/15 0420  NA 142 141  K 3.7 3.9  CL 106 106  CO2 24 24  GLUCOSE 94 86  BUN 11 11  CREATININE 1.12 1.02  CALCIUM 8.2* 8.4*  MG 2.0 2.1    ASSESSMENT AND PLAN:   1. Persistent AFib, admitted for Tikosyn initiation     S/p 1st dose last night, converted to SR after first dose, HR  50's-60's     QTc/EKG stable to continue     K+ 3.9 Sia Gabrielsen replace     Mag 2.1     Creat 1.12 (Calc Cr. Cl 141.74), continue 568mcg BID CHA2DS2Vasc is at least one on Xarelto  2. HTN     Stable, no need for changes  3. Obesity/OSA      Reports compliance with CPAP    Tayton Decaire M. Jerritt Cardoza MD 06/10/2015 2:30 PM

## 2015-06-10 NOTE — Progress Notes (Signed)
Pt placed himself on home CPAP earlier tonight.

## 2015-06-11 ENCOUNTER — Other Ambulatory Visit: Payer: Self-pay | Admitting: Physician Assistant

## 2015-06-11 DIAGNOSIS — I441 Atrioventricular block, second degree: Secondary | ICD-10-CM

## 2015-06-11 DIAGNOSIS — I4819 Other persistent atrial fibrillation: Secondary | ICD-10-CM

## 2015-06-11 LAB — BASIC METABOLIC PANEL
Anion gap: 8 (ref 5–15)
BUN: 11 mg/dL (ref 6–20)
CO2: 28 mmol/L (ref 22–32)
Calcium: 8.5 mg/dL — ABNORMAL LOW (ref 8.9–10.3)
Chloride: 106 mmol/L (ref 101–111)
Creatinine, Ser: 0.96 mg/dL (ref 0.61–1.24)
GFR calc Af Amer: >60 mL/min (ref >60)
GFR calc non Af Amer: >60 mL/min (ref >60)
Glucose, Bld: 89 mg/dL (ref 65–99)
Potassium: 4.4 mmol/L (ref 3.5–5.1)
Sodium: 142 mmol/L (ref 135–145)

## 2015-06-11 LAB — MAGNESIUM: Magnesium: 2.2 mg/dL (ref 1.7–2.4)

## 2015-06-11 MED ORDER — DOFETILIDE 500 MCG PO CAPS: 500.0000 ug | ORAL_CAPSULE | Freq: Two times a day (BID) | ORAL | Status: DC

## 2015-06-11 MED ORDER — RIVAROXABAN 20 MG PO TABS: 20.0000 mg | ORAL_TABLET | Freq: Every day | ORAL | Status: DC

## 2015-06-11 NOTE — Progress Notes (Signed)
All d/c instructions explained and given to pt.  Verbalized understanding. .  Briella Hobday, RN. 

## 2015-06-11 NOTE — Progress Notes (Signed)
Notified by central monitor pt's SB 30-40, A. Flutter  Late this am.  EKG post tikosyn A. Fib heart rate 63.  Pt asymptomatic.  R. Barrett. Notified.  No new orders.  Karie Kirks, Therapist, sports.

## 2015-06-11 NOTE — Discharge Summary (Signed)
Discharge Summary    Patient ID: Sean Morales,  MRN: [id number], DOB/AGE: 09/11/1960 55 y.o.  Admit date: 06/08/2015 Discharge date: 06/11/2015  Primary Care Provider: [place] Primary Cardiologist: [name]  Discharge Diagnoses    Principal Problem:   Atrial fibrillation [facility]) Active Problems:   Chest pain   HTN (hypertension)   GERD (gastroesophageal reflux disease)   Tobacco use   OSA on CPAP   Heart block AV second degree-nocturnal   Allergies Allergies  Allergen Reactions  . Bee Venom Anaphylaxis    [place] Hornet  . Other     Copyronil per patient  . Penicillins Other (See Comments)    Unknown. Childhood allergy. Has patient had a PCN reaction causing immediate rash, facial/tongue/throat swelling, SOB or lightheadedness with hypotension: Unknown Has patient had a PCN reaction causing severe rash involving mucus membranes or skin necrosis: Unknown Has patient had a PCN reaction that required hospitalization: Unknown Has patient had a PCN reaction occurring within the last 10 years: No If all of the above answers are "NO", then may proceed with Cephalosporin use.   . Sulfa Antibiotics Other (See Comments)    Unknown. Childhood allergy.  . Tetracyclines & Related Other (See Comments)    Unknown. Childhood allergy.    Diagnostic Studies/Procedures    ECGs _____________   History of Present Illness     55 yo male w/ hx afib, on Xarelto, previously on amiodarone. Tikosyn loading was recommended and he came to the hospital for this on 06/08/2015.  Hospital Course     Consultants: None   He was started on Tikosyn. His other medications were reviewed and his HCTZ had been stopped. Amiodarone had also been discontinued and the level was drawn, coming back out 0.2, acceptable for Tikosyn initiation.  He was continued on Xarelto for anticoagulation, CHADS2VASC=1. His electrolytes were followed closely as well as his magnesium.  They were supplemented as needed.  The QTC on his ECG was followed closely and showed no significant increases with Tikosyn. He continued on his home CPAP.  Initially, he converted to sinus rhythm but reverted back to atrial fibrillation. His rate was controlled. He had some brief bradycardia but was not symptomatic with this.   On 06/11/2015, he was seen by Dr. Caryl Comes and all data were reviewed. He has to get an appointment at the [facility] clinic with a BMET. He is encouraged to continue on a high potassium diet. No further inpatient workup is indicated and he is considered stable for discharge, to follow up as an outpatient. _____________  Discharge Vitals Blood pressure 127/81, pulse 61, temperature 98.4 F (36.9 C), temperature source Oral, resp. rate 18, height 6' (1.829 m), weight 290 lb 14.4 oz (131.951 kg), SpO2 98 %.  Filed Weights   06/09/15 0643 06/10/15 0450 06/11/15 0518  Weight: 293 lb 1.6 oz (132.949 kg) 293 lb 11.2 oz (133.221 kg) 290 lb 14.4 oz (131.951 kg)    Labs & Radiologic Studies    Basic Metabolic Panel  Recent Labs  06/10/15 0420 06/11/15 0510  NA 141 142  K 3.9 4.4  CL 106 106  CO2 24 28  GLUCOSE 86 89  BUN 11 11  CREATININE 1.02 0.96  CALCIUM 8.4* 8.5*  MG 2.1 2.2    Disposition   Pt is being discharged home today in good condition.  Follow-up Plans & Appointments    Follow-up Information    Follow up with [name], MD.  Specialty:  Cardiology   Why:  As scheduled   Contact information:   [address]  [place] [address] [phone]       Follow up with Virl Axe, MD.   Specialty:  Cardiology   Why:  The office will call.   Contact information:   [id number] [place] [address] [phone]      Discharge Instructions    Diet - low sodium heart healthy    Complete by:  As directed      Increase activity slowly    Complete by:  As directed            Discharge Medications   Current  Discharge Medication List    START taking these medications   Details  dofetilide (TIKOSYN) 500 MCG capsule Take 1 capsule (500 mcg total) by mouth 2 (two) times daily. Qty: 60 capsule, Refills: 11      CONTINUE these medications which have CHANGED   Details  rivaroxaban (XARELTO) 20 MG TABS tablet Take 1 tablet (20 mg total) by mouth daily with supper. Qty: 30 tablet, Refills: 11      CONTINUE these medications which have NOT CHANGED   Details  acetaminophen (TYLENOL) 500 MG tablet Take 1,000-1,500 mg by mouth every 6 (six) hours as needed for headache.    acetaminophen-codeine (TYLENOL #3) 300-30 MG tablet Take 1 tablet by mouth every 8 (eight) hours as needed for severe pain.     amLODipine (NORVASC) 10 MG tablet Take 1 tablet (10 mg total) by mouth daily.    ibuprofen (ADVIL,MOTRIN) 200 MG tablet Take 600 mg by mouth every 8 (eight) hours as needed for headache.     losartan (COZAAR) 100 MG tablet Take 100 mg by mouth daily.     metoprolol tartrate (LOPRESSOR) 25 MG tablet Take 1 tablet (25 mg total) by mouth 2 (two) times daily. Qty: 180 tablet, Refills: 3    omeprazole (PRILOSEC) 40 MG capsule Take 1 capsule by mouth daily.          Outstanding Labs/Studies   None  Duration of Discharge Encounter   Greater than 30 minutes including physician time.  Jonetta Speak NP 06/11/2015, 2:12 PM

## 2015-06-11 NOTE — Progress Notes (Signed)
       Patient Name: Sean Morales      SUBJECTIVE: Without complaint. Has treated sleep apnea.  Past Medical History  Diagnosis Date  . Hypertension   . Reflux   . Persistent atrial fibrillation ([place])   . Depression   . Obstructive sleep apnea   . DJD (degenerative joint disease)   . Overweight     Scheduled Meds:  Scheduled Meds: . amLODipine  10 mg Oral Daily  . dofetilide  500 mcg Oral BID  . losartan  100 mg Oral Daily  . metoprolol tartrate  25 mg Oral BID  . pantoprazole  40 mg Oral Daily  . rivaroxaban  20 mg Oral Q supper  . sodium chloride flush  3 mL Intravenous Q12H   Continuous Infusions:  sodium chloride, acetaminophen, acetaminophen-codeine, ALPRAZolam, ibuprofen, ondansetron (ZOFRAN) IV, sodium chloride flush, zolpidem    PHYSICAL EXAM Filed Vitals:   06/10/15 0837 06/10/15 1253 06/10/15 2058 06/11/15 0518  BP: 129/88 123/78 121/77 146/79  Pulse: 71 56 67 57  Temp:  98.3 F (36.8 C) 97.6 F (36.4 C) 98.2 F (36.8 C)  TempSrc:  Oral Oral Oral  Resp: 18 18 17 16   Height:      Weight:    290 lb 14.4 oz (131.951 kg)  SpO2: 96% 95% 96% 96%    Well developed and nourished in no acute distress HENT normal Neck supple with JVP-flat Clear Regular rate and rhythm, no murmurs or gallops Abd-soft with active BS No Clubbing cyanosis edema Skin-warm and dry A & Oriented  Grossly normal sensory and motor function   TELEMETRY: Reviewed telemetry pt in  nocturnal heart block. Appears 2 AVB1 but was read by the telemetry And may in fact be 2 AVB 2.   Intake/Output Summary (Last 24 hours) at 06/11/15 0739 Last data filed at 06/11/15 0651  Gross per 24 hour  Intake    720 ml  Output   2375 ml  Net  -1655 ml    LABS: Basic Metabolic Panel:  Recent Labs Lab 06/08/15 1113 06/08/15 1905 06/09/15 0409 06/10/15 0420 06/11/15 0510  NA 140 142 142 141 142  K 4.1 4.2 3.7 3.9 4.4  CL 103 108 106 106 106  CO2 26 25 24 24 28   GLUCOSE 92  116* 94 86 89  BUN 11 13 11 11 11   CREATININE 1.05 1.11 1.12 1.02 0.96  CALCIUM 9.0 8.6* 8.2* 8.4* 8.5*  MG 2.0 2.1 2.0 2.1 2.2   Cardiac Enzymes:   ASSESSMENT AND PLAN:  Principal Problem:   Atrial fibrillation ([facility]) Active Problems:   Chest pain   HTN (hypertension)   GERD (gastroesophageal reflux disease)   Tobacco use   OSA on CPAP   Heart block AV second degree-nocturnal  In sinus  Ok for discharge w Will encourage high K diet  Will need BMET at [place] clinic followup  Appointment needed with [place] [facility] at the office. Does not appear to be scheduled.   Signed, Virl Axe MD  06/11/2015

## 2015-06-11 NOTE — Progress Notes (Signed)
Notified R. Barrett, PA if [employer].  Instructed to send it to main hospital pharmacy for 7 day supply.  Pt made aware that he will have to wait for RX.  All d/c instructions explained and given to pt.    Also informed pt to wait for tikosyn from pharmacy before going home.    CN  Made aware.Verbalized understanding

## 2015-06-11 NOTE — Progress Notes (Signed)
   06/10/15 2352  Vitals  ECG Heart Rate (!) 55  Cardiac Rhythm SB;Heart block  Heart Block Type 2nd degree AVB ([place]ob[place] II)  Dr. Eula Fried made aware, pt resting in bed asleep.

## 2015-06-14 ENCOUNTER — Telehealth: Payer: Self-pay

## 2015-06-14 NOTE — Telephone Encounter (Signed)
Prior auth for Dofetilide 595mcg sent to [facility].

## 2015-06-17 ENCOUNTER — Encounter (HOSPITAL_COMMUNITY): Payer: Self-pay | Admitting: Nurse Practitioner

## 2015-06-17 ENCOUNTER — Ambulatory Visit (HOSPITAL_COMMUNITY)
Admission: RE | Admit: 2015-06-17 | Discharge: 2015-06-17 | Disposition: A | Discharge status: Home / Self Care | Payer: BLUE CROSS/BLUE SHIELD | Source: Ambulatory Visit | Attending: Nurse Practitioner | Admitting: Nurse Practitioner

## 2015-06-17 VITALS — BP 118/78 | HR 72 | Ht 72.0 in | Wt 293.2 lb

## 2015-06-17 DIAGNOSIS — I10 Essential (primary) hypertension: Secondary | ICD-10-CM | POA: Diagnosis not present

## 2015-06-17 DIAGNOSIS — E669 Obesity, unspecified: Secondary | ICD-10-CM | POA: Insufficient documentation

## 2015-06-17 DIAGNOSIS — Z7901 Long term (current) use of anticoagulants: Secondary | ICD-10-CM | POA: Diagnosis not present

## 2015-06-17 DIAGNOSIS — Z881 Allergy status to other antibiotic agents status: Secondary | ICD-10-CM | POA: Insufficient documentation

## 2015-06-17 DIAGNOSIS — F17210 Nicotine dependence, cigarettes, uncomplicated: Secondary | ICD-10-CM | POA: Diagnosis not present

## 2015-06-17 DIAGNOSIS — Z6839 Body mass index (BMI) 39.0-39.9, adult: Secondary | ICD-10-CM | POA: Diagnosis not present

## 2015-06-17 DIAGNOSIS — G4733 Obstructive sleep apnea (adult) (pediatric): Secondary | ICD-10-CM | POA: Diagnosis not present

## 2015-06-17 DIAGNOSIS — I481 Persistent atrial fibrillation: Secondary | ICD-10-CM | POA: Insufficient documentation

## 2015-06-17 DIAGNOSIS — Z880 Allergy status to penicillin: Secondary | ICD-10-CM | POA: Insufficient documentation

## 2015-06-17 DIAGNOSIS — Z823 Family history of stroke: Secondary | ICD-10-CM | POA: Insufficient documentation

## 2015-06-17 DIAGNOSIS — I4891 Unspecified atrial fibrillation: Secondary | ICD-10-CM | POA: Diagnosis not present

## 2015-06-17 DIAGNOSIS — K219 Gastro-esophageal reflux disease without esophagitis: Secondary | ICD-10-CM | POA: Diagnosis not present

## 2015-06-17 DIAGNOSIS — I4819 Other persistent atrial fibrillation: Secondary | ICD-10-CM

## 2015-06-17 DIAGNOSIS — Z882 Allergy status to sulfonamides status: Secondary | ICD-10-CM | POA: Diagnosis not present

## 2015-06-17 DIAGNOSIS — Z79899 Other long term (current) drug therapy: Secondary | ICD-10-CM | POA: Diagnosis not present

## 2015-06-17 LAB — BASIC METABOLIC PANEL
ANION GAP: 9 (ref 5–15)
BUN: 11 mg/dL (ref 6–20)
CALCIUM: 9 mg/dL (ref 8.9–10.3)
CO2: 26 mmol/L (ref 22–32)
Chloride: 104 mmol/L (ref 101–111)
Creatinine, Ser: 1.02 mg/dL (ref 0.61–1.24)
GFR calc Af Amer: >60 mL/min (ref >60)
GLUCOSE: 101 mg/dL — AB (ref 65–99)
Potassium: 4.1 mmol/L (ref 3.5–5.1)
SODIUM: 139 mmol/L (ref 135–145)

## 2015-06-17 LAB — MAGNESIUM: Magnesium: 2.1 mg/dL (ref 1.7–2.4)

## 2015-06-17 NOTE — Progress Notes (Signed)
Patient ID: Sean Morales, male   DOB: Nov 18, 1960, 55 y.o.   MRN: [id number]     Primary Care Physician: [place] Referring Physician: [facility] f/u   LADD GLAWSON is a 55 y.o. male with a h/o persistent afib that recently was in [facility] for [place] therapy. He was discharged last Saturday. Has not any afib since d/c. He feels improved. Precautions re Phyllis Ginger use discussed with pt. He does have tikosyn prescription now, had to get PA before drugstore would RX drug, which took a few days, but he did not have any missed doses. Qtc stable at 468 ms.  Today, he denies symptoms of palpitations, chest pain, shortness of breath, orthopnea, PND, lower extremity edema, dizziness, presyncope, syncope, or neurologic sequela. The patient is tolerating medications without difficulties and is otherwise without complaint today.   Past Medical History  Diagnosis Date  . Hypertension   . Reflux   . Persistent atrial fibrillation ([place])   . Depression   . Obstructive sleep apnea   . DJD (degenerative joint disease)   . Overweight    Past Surgical History  Procedure Laterality Date  . Appendectomy    . Knee surgety    . Tonsillectomy      age 36  . Cardioversion N/A 01/20/2015    Procedure: CARDIOVERSION;  Surgeon: [name], MD;  Location: [facility];  Service: Cardiovascular;  Laterality: N/A;    Current Outpatient Prescriptions  Medication Sig Dispense Refill  . acetaminophen (TYLENOL) 500 MG tablet Take 1,000-1,500 mg by mouth every 6 (six) hours as needed for headache.    Marland Kitchen acetaminophen-codeine (TYLENOL #3) 300-30 MG tablet Take 1 tablet by mouth every 8 (eight) hours as needed for severe pain.     Marland Kitchen amLODipine (NORVASC) 10 MG tablet Take 1 tablet (10 mg total) by mouth daily.    Marland Kitchen dofetilide (TIKOSYN) 500 MCG capsule Take 1 capsule (500 mcg total) by mouth 2 (two) times daily. 60 capsule 11  . losartan (COZAAR) 100 MG tablet Take 100 mg by mouth daily.     .  metoprolol tartrate (LOPRESSOR) 25 MG tablet Take 1 tablet (25 mg total) by mouth 2 (two) times daily. 180 tablet 3  . omeprazole (PRILOSEC) 40 MG capsule Take 1 capsule by mouth daily.    . rivaroxaban (XARELTO) 20 MG TABS tablet Take 1 tablet (20 mg total) by mouth daily with supper. 30 tablet 11  . [DISCONTINUED] lisinopril (PRINIVIL,ZESTRIL) 10 MG tablet Take 1 tablet (10 mg total) by mouth daily. (Patient not taking: Reported on 04/12/2014) 30 tablet 6   No current facility-administered medications for this encounter.    Allergies  Allergen Reactions  . Bee Venom Anaphylaxis    [place] Hornet  . Other     Copyronil per patient  . Penicillins Other (See Comments)    Unknown. Childhood allergy. Has patient had a PCN reaction causing immediate rash, facial/tongue/throat swelling, SOB or lightheadedness with hypotension: Unknown Has patient had a PCN reaction causing severe rash involving mucus membranes or skin necrosis: Unknown Has patient had a PCN reaction that required hospitalization: Unknown Has patient had a PCN reaction occurring within the last 10 years: No If all of the above answers are "NO", then may proceed with Cephalosporin use.   . Sulfa Antibiotics Other (See Comments)    Unknown. Childhood allergy.  . Tetracyclines & Related Other (See Comments)    Unknown. Childhood allergy.    Social History   Social History  .  Marital Status: Married    Spouse Name: [place]  . Number of Children: 4  . Years of Education: 14   Occupational History  .      [employer]   Social History Main Topics  . Smoking status: Current Every Day Smoker -- 0.50 packs/day for 25 years    Types: Cigarettes    Start date: 01/08/1977  . Smokeless tobacco: Never Used  . Alcohol Use: No     Comment: rarely  . Drug Use: No  . Sexual Activity: Yes   Other Topics Concern  . Not on file   Social History Narrative   Lives in [place] (near [place]) with wife.   Patient 1  cup of coffee daily,occas. 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       Family History  Problem Relation Age of Onset  . Deep vein thrombosis Father   . Stroke Mother     ROS- All systems are reviewed and negative except as per the HPI above  Physical Exam: Filed Vitals:   06/17/15 0932  BP: 118/78  Pulse: 72  Height: 6' (1.829 m)  Weight: 293 lb 3.2 oz (132.995 kg)    GEN- The patient is well appearing, alert and oriented x 3 today.   Head- normocephalic, atraumatic Eyes-  Sclera clear, conjunctiva pink Ears- hearing intact Oropharynx- clear Neck- supple, no JVP Lymph- no cervical lymphadenopathy Lungs- Clear to ausculation bilaterally, normal work of breathing Heart- Regular rate and rhythm, no murmurs, rubs or gallops, PMI not laterally displaced GI- soft, NT, ND, + BS Extremities- no clubbing, cyanosis, or edema MS- no significant deformity or atrophy Skin- no rash or lesion Psych- euthymic mood, full affect Neuro- strength and sensation are intact  EKG- Sinus rhythm with first degree AV block with PAC. Pr itn 274 ms, QRS int 86 ms, qtc 468 ms Otherwise normal ekg.  Assessment and Plan: 1. Persistent afib Doing well on tikosyn, staying in SR.  Continue tikosyn 500 mg bid Continue metoprolol Continue xarelto Bmet/mag today  2. HTN Stable  3. Obesity Working on weight loss   Geroge Baseman. Bodey Frizell, [facility] [address] [place], [place] [address] [phone]

## 2015-06-23 ENCOUNTER — Ambulatory Visit: Payer: BLUE CROSS/BLUE SHIELD | Admitting: Cardiovascular Disease

## 2015-07-18 ENCOUNTER — Ambulatory Visit (HOSPITAL_COMMUNITY): Payer: BLUE CROSS/BLUE SHIELD | Admitting: Nurse Practitioner

## 2015-07-21 ENCOUNTER — Encounter (HOSPITAL_COMMUNITY): Payer: Self-pay | Admitting: Nurse Practitioner

## 2015-07-21 ENCOUNTER — Ambulatory Visit (HOSPITAL_COMMUNITY)
Admission: RE | Admit: 2015-07-21 | Discharge: 2015-07-21 | Disposition: A | Discharge status: Home / Self Care | Payer: BLUE CROSS/BLUE SHIELD | Source: Ambulatory Visit | Attending: Nurse Practitioner | Admitting: Nurse Practitioner

## 2015-07-21 VITALS — BP 120/86 | HR 68 | Ht 72.0 in | Wt 297.2 lb

## 2015-07-21 DIAGNOSIS — Z881 Allergy status to other antibiotic agents status: Secondary | ICD-10-CM | POA: Insufficient documentation

## 2015-07-21 DIAGNOSIS — E669 Obesity, unspecified: Secondary | ICD-10-CM | POA: Diagnosis not present

## 2015-07-21 DIAGNOSIS — I4891 Unspecified atrial fibrillation: Secondary | ICD-10-CM | POA: Diagnosis not present

## 2015-07-21 DIAGNOSIS — K219 Gastro-esophageal reflux disease without esophagitis: Secondary | ICD-10-CM | POA: Diagnosis not present

## 2015-07-21 DIAGNOSIS — I10 Essential (primary) hypertension: Secondary | ICD-10-CM | POA: Diagnosis not present

## 2015-07-21 DIAGNOSIS — F17210 Nicotine dependence, cigarettes, uncomplicated: Secondary | ICD-10-CM | POA: Diagnosis not present

## 2015-07-21 DIAGNOSIS — Z880 Allergy status to penicillin: Secondary | ICD-10-CM | POA: Diagnosis not present

## 2015-07-21 DIAGNOSIS — Z79899 Other long term (current) drug therapy: Secondary | ICD-10-CM | POA: Insufficient documentation

## 2015-07-21 DIAGNOSIS — Z823 Family history of stroke: Secondary | ICD-10-CM | POA: Diagnosis not present

## 2015-07-21 DIAGNOSIS — I481 Persistent atrial fibrillation: Secondary | ICD-10-CM | POA: Diagnosis not present

## 2015-07-21 DIAGNOSIS — Z882 Allergy status to sulfonamides status: Secondary | ICD-10-CM | POA: Diagnosis not present

## 2015-07-21 DIAGNOSIS — Z7901 Long term (current) use of anticoagulants: Secondary | ICD-10-CM | POA: Insufficient documentation

## 2015-07-21 DIAGNOSIS — Z6841 Body Mass Index (BMI) 40.0 and over, adult: Secondary | ICD-10-CM | POA: Insufficient documentation

## 2015-07-21 DIAGNOSIS — G4733 Obstructive sleep apnea (adult) (pediatric): Secondary | ICD-10-CM | POA: Diagnosis not present

## 2015-07-21 LAB — BASIC METABOLIC PANEL
Anion gap: 9 (ref 5–15)
BUN: 9 mg/dL (ref 6–20)
CALCIUM: 8.7 mg/dL — AB (ref 8.9–10.3)
CHLORIDE: 107 mmol/L (ref 101–111)
CO2: 25 mmol/L (ref 22–32)
CREATININE: 0.88 mg/dL (ref 0.61–1.24)
GFR calc non Af Amer: >60 mL/min (ref >60)
Glucose, Bld: 98 mg/dL (ref 65–99)
Potassium: 4 mmol/L (ref 3.5–5.1)
Sodium: 141 mmol/L (ref 135–145)

## 2015-07-21 LAB — MAGNESIUM: Magnesium: 1.9 mg/dL (ref 1.7–2.4)

## 2015-07-21 NOTE — Progress Notes (Signed)
Patient ID: Sean Morales, male   DOB: 29-Nov-1960, 55 y.o.   MRN: [id number]     Primary Care Physician: [place] Referring Physician: [facility] f/u EP: Dr. Merlene Morse is a 55 y.o. male with a h/o persistent afib that was in [facility] for tikosyn therapy around one month ago. He has not any afib since d/c. He feels improved. Precautions re Phyllis Ginger use discussed with pt. He does have tikosyn prescription now, had to get PA before drugstore would RX drug, which took a few days, but he did not have any missed doses. Qtc stable at 463 ms. No bleeding issues with xarelto.  Today, he denies symptoms of palpitations, chest pain, shortness of breath, orthopnea, PND, lower extremity edema, dizziness, presyncope, syncope, or neurologic sequela. The patient is tolerating medications without difficulties and is otherwise without complaint today.   Past Medical History  Diagnosis Date  . Hypertension   . Reflux   . Persistent atrial fibrillation ([place])   . Depression   . Obstructive sleep apnea   . DJD (degenerative joint disease)   . Overweight    Past Surgical History  Procedure Laterality Date  . Appendectomy    . Knee surgety    . Tonsillectomy      age 27  . Cardioversion N/A 01/20/2015    Procedure: CARDIOVERSION;  Surgeon: [name], MD;  Location: [facility];  Service: Cardiovascular;  Laterality: N/A;    Current Outpatient Prescriptions  Medication Sig Dispense Refill  . acetaminophen (TYLENOL) 500 MG tablet Take 1,000-1,500 mg by mouth every 6 (six) hours as needed for headache.    Marland Kitchen acetaminophen-codeine (TYLENOL #3) 300-30 MG tablet Take 1 tablet by mouth every 8 (eight) hours as needed for severe pain.     Marland Kitchen amLODipine (NORVASC) 10 MG tablet Take 1 tablet (10 mg total) by mouth daily.    Marland Kitchen dofetilide (TIKOSYN) 500 MCG capsule Take 1 capsule (500 mcg total) by mouth 2 (two) times daily. 60 capsule 11  . losartan (COZAAR) 100 MG tablet Take 100  mg by mouth daily.     . metoprolol tartrate (LOPRESSOR) 25 MG tablet Take 1 tablet (25 mg total) by mouth 2 (two) times daily. 180 tablet 3  . omeprazole (PRILOSEC) 40 MG capsule Take 1 capsule by mouth daily.    . rivaroxaban (XARELTO) 20 MG TABS tablet Take 1 tablet (20 mg total) by mouth daily with supper. 30 tablet 11  . [DISCONTINUED] lisinopril (PRINIVIL,ZESTRIL) 10 MG tablet Take 1 tablet (10 mg total) by mouth daily. (Patient not taking: Reported on 04/12/2014) 30 tablet 6   No current facility-administered medications for this encounter.    Allergies  Allergen Reactions  . Bee Venom Anaphylaxis    [place] Hornet  . Other     Copyronil per patient  . Penicillins Other (See Comments)    Unknown. Childhood allergy. Has patient had a PCN reaction causing immediate rash, facial/tongue/throat swelling, SOB or lightheadedness with hypotension: Unknown Has patient had a PCN reaction causing severe rash involving mucus membranes or skin necrosis: Unknown Has patient had a PCN reaction that required hospitalization: Unknown Has patient had a PCN reaction occurring within the last 10 years: No If all of the above answers are "NO", then may proceed with Cephalosporin use.   . Sulfa Antibiotics Other (See Comments)    Unknown. Childhood allergy.  . Tetracyclines & Related Other (See Comments)    Unknown. Childhood allergy.  Social History   Social History  . Marital Status: Married    Spouse Name: [place]  . Number of Children: 4  . Years of Education: 14   Occupational History  .      [employer]   Social History Main Topics  . Smoking status: Current Every Day Smoker -- 0.50 packs/day for 25 years    Types: Cigarettes    Start date: 01/08/1977  . Smokeless tobacco: Never Used  . Alcohol Use: No     Comment: rarely  . Drug Use: No  . Sexual Activity: Yes   Other Topics Concern  . Not on file   Social History Narrative   Lives in [place] (near  [place]) with wife.   Patient 1 cup of coffee daily,occas. 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       Family History  Problem Relation Age of Onset  . Deep vein thrombosis Father   . Stroke Mother     ROS- All systems are reviewed and negative except as per the HPI above  Physical Exam: Filed Vitals:   07/21/15 1137  BP: 120/86  Pulse: 68  Height: 6' (1.829 m)  Weight: 297 lb 3.2 oz (134.809 kg)    GEN- The patient is well appearing, alert and oriented x 3 today.   Head- normocephalic, atraumatic Eyes-  Sclera clear, conjunctiva pink Ears- hearing intact Oropharynx- clear Neck- supple, no JVP Lymph- no cervical lymphadenopathy Lungs- Clear to ausculation bilaterally, normal work of breathing Heart- Regular rate and rhythm, no murmurs, rubs or gallops, PMI not laterally displaced GI- soft, NT, ND, + BS Extremities- no clubbing, cyanosis, or edema MS- no significant deformity or atrophy Skin- no rash or lesion Psych- euthymic mood, full affect Neuro- strength and sensation are intact  EKG- Sinus rhythm with first degree AV block with PAC. Pr itn 246 ms, QRS int 84 ms, qtc 463 ms Otherwise normal ekg.  Assessment and Plan: 1. Persistent afib Doing well on tikosyn, staying in SR.  Continue tikosyn 500 mg bid Continue metoprolol Continue xarelto Bmet/mag today  2. HTN Stable  3. Obesity Working on weight loss  He does see Dr. Bronson Ing in early August He will need to be followed every 3 months for EKG, bmet/mag I will be happy to help follow or can be managed by Dr. Melrose Nakayama C. [place], [facility] [address] [place], [place] [address] [phone]

## 2015-07-22 ENCOUNTER — Other Ambulatory Visit (HOSPITAL_COMMUNITY): Payer: Self-pay | Admitting: *Deleted

## 2015-07-22 MED ORDER — MAGNESIUM 200 MG PO TABS
200.0000 mg | ORAL_TABLET | Freq: Every day | ORAL | Status: DC
Start: 2015-07-22 — End: 2016-02-13

## 2015-08-11 DIAGNOSIS — L918 Other hypertrophic disorders of the skin: Secondary | ICD-10-CM | POA: Diagnosis not present

## 2015-08-11 DIAGNOSIS — D485 Neoplasm of uncertain behavior of skin: Secondary | ICD-10-CM | POA: Diagnosis not present

## 2015-08-11 DIAGNOSIS — D225 Melanocytic nevi of trunk: Secondary | ICD-10-CM | POA: Diagnosis not present

## 2015-08-11 DIAGNOSIS — B078 Other viral warts: Secondary | ICD-10-CM | POA: Diagnosis not present

## 2015-08-11 DIAGNOSIS — L820 Inflamed seborrheic keratosis: Secondary | ICD-10-CM | POA: Diagnosis not present

## 2015-08-17 DIAGNOSIS — H43811 Vitreous degeneration, right eye: Secondary | ICD-10-CM | POA: Diagnosis not present

## 2015-11-09 ENCOUNTER — Ambulatory Visit (INDEPENDENT_AMBULATORY_CARE_PROVIDER_SITE_OTHER): Payer: BLUE CROSS/BLUE SHIELD | Admitting: Cardiovascular Disease

## 2015-11-09 ENCOUNTER — Encounter: Payer: Self-pay | Admitting: *Deleted

## 2015-11-09 ENCOUNTER — Encounter: Payer: Self-pay | Admitting: Cardiovascular Disease

## 2015-11-09 VITALS — BP 122/98 | HR 61 | Ht 72.0 in | Wt 297.0 lb

## 2015-11-09 DIAGNOSIS — I481 Persistent atrial fibrillation: Secondary | ICD-10-CM | POA: Diagnosis not present

## 2015-11-09 DIAGNOSIS — I10 Essential (primary) hypertension: Secondary | ICD-10-CM

## 2015-11-09 DIAGNOSIS — I4891 Unspecified atrial fibrillation: Secondary | ICD-10-CM | POA: Diagnosis not present

## 2015-11-09 DIAGNOSIS — Z79899 Other long term (current) drug therapy: Secondary | ICD-10-CM

## 2015-11-09 DIAGNOSIS — I4819 Other persistent atrial fibrillation: Secondary | ICD-10-CM

## 2015-11-09 NOTE — Progress Notes (Signed)
SUBJECTIVE: The patient presents for follow-up of persistent atrial fibrillation. He is now maintained on Tikosyn and follows in our atrial fibrillation clinic. He is anticoagulated with Xarelto. Magnesium 1.9 on 4/20, potassium 4. He was started on magnesium supplementation.  ECG performed in the office today which I personally interpreted demonstrated sinus rhythm with first-degree AV block, PR 246 ms, corrected QT interval 445 ms.  Feeling well. Denies chest pain and palpitations. Michela Pitcher he has put on an pounds. Uses CPAP nightly.  Review of Systems: As per "subjective", otherwise negative.  Allergies  Allergen Reactions  . Bee Venom Anaphylaxis    [place] Hornet  . Other     Copyronil per patient  . Penicillins Other (See Comments)    Unknown. Childhood allergy. Has patient had a PCN reaction causing immediate rash, facial/tongue/throat swelling, SOB or lightheadedness with hypotension: Unknown Has patient had a PCN reaction causing severe rash involving mucus membranes or skin necrosis: Unknown Has patient had a PCN reaction that required hospitalization: Unknown Has patient had a PCN reaction occurring within the last 10 years: No If all of the above answers are "NO", then may proceed with Cephalosporin use.   . Sulfa Antibiotics Other (See Comments)    Unknown. Childhood allergy.  . Tetracyclines & Related Other (See Comments)    Unknown. Childhood allergy.    Current Outpatient Prescriptions  Medication Sig Dispense Refill  . acetaminophen (TYLENOL) 500 MG tablet Take 1,000-1,500 mg by mouth every 6 (six) hours as needed for headache.    Marland Kitchen acetaminophen-codeine (TYLENOL #3) 300-30 MG tablet Take 1 tablet by mouth every 8 (eight) hours as needed for severe pain.     Marland Kitchen amLODipine (NORVASC) 10 MG tablet Take 1 tablet (10 mg total) by mouth daily.    Marland Kitchen dofetilide (TIKOSYN) 500 MCG capsule Take 1 capsule (500 mcg total) by mouth 2 (two) times daily. 60 capsule 11  .  losartan (COZAAR) 100 MG tablet Take 100 mg by mouth daily.     . Magnesium 200 MG TABS Take 1 tablet (200 mg total) by mouth daily. 60 each   . metoprolol tartrate (LOPRESSOR) 25 MG tablet Take 1 tablet (25 mg total) by mouth 2 (two) times daily. 180 tablet 3  . omeprazole (PRILOSEC) 40 MG capsule Take 1 capsule by mouth daily.    . rivaroxaban (XARELTO) 20 MG TABS tablet Take 1 tablet (20 mg total) by mouth daily with supper. 30 tablet 11   No current facility-administered medications for this visit.     Past Medical History:  Diagnosis Date  . Depression   . DJD (degenerative joint disease)   . Hypertension   . Obstructive sleep apnea   . Overweight   . Persistent atrial fibrillation ([place])   . Reflux     Past Surgical History:  Procedure Laterality Date  . APPENDECTOMY    . CARDIOVERSION N/A 01/20/2015   Procedure: CARDIOVERSION;  Surgeon: [name], MD;  Location: [facility];  Service: Cardiovascular;  Laterality: N/A;  . knee surgety    . TONSILLECTOMY     age 81    Social History   Social History  . Marital status: Married    Spouse name: [place]  . Number of children: 4  . Years of education: 14   Occupational History  .      [employer]   Social History Main Topics  . Smoking status: Current Every Day Smoker    Packs/day: 0.50  Years: 25.00    Types: Cigarettes    Start date: 01/08/1977  . Smokeless tobacco: Never Used  . Alcohol use No     Comment: rarely  . Drug use: No  . Sexual activity: Yes   Other Topics Concern  . Not on file   Social History Narrative   Lives in [place] (near [place]) with wife.   Patient 1 cup of coffee daily,occas. 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        Vitals:   11/09/15 1018  BP: (!) 122/98  Pulse: 61  SpO2: 98%  Weight: 297 lb (134.7 kg)  Height: 6' (1.829 m)    PHYSICAL EXAM General: NAD HEENT: Normal. Neck: No JVD, no thyromegaly. Lungs: Clear to auscultation  bilaterally with normal respiratory effort. CV: Nondisplaced PMI.  Regular rate and rhythm, normal S1/S2, no S3/S4, no murmur. No pretibial or periankle edema.     Abdomen: Obese.  Neurologic: Alert and oriented.  Psych: Normal affect. Skin: Normal. Musculoskeletal: No gross deformities.    ECG: Most recent ECG reviewed.      ASSESSMENT AND PLAN: 1. Persistent a fib: Stable. Continue dofetilide, metoprolol, and Xarelto. He can continue to follow-up in atrial fibrillation clinic. Will check BMET and Mg.  2. Elevated DBP: Needs monitoring. No changes today.  Dispo: Can fu in a fib clinic.  Kate Sable, M.D., F.A.C.C.

## 2015-11-09 NOTE — Patient Instructions (Signed)
Medication Instructions:   Continue all current medications.  Labwork:  BMET, Magnesium - orders given today.  Office will contact with results via phone or letter.    Testing/Procedures:  None.  Follow-Up:  As needed with Dr. Bronson Ing.  3 months with Roderic Palau, NP (AF clinic)  Any Other Special Instructions Will Be Listed Below (If Applicable).  If you need a refill on your cardiac medications before your next appointment, please call your pharmacy.

## 2015-11-09 NOTE — Addendum Note (Signed)
Addended by: Laurine Blazer on: 11/09/2015 10:39 AM   Modules accepted: Orders

## 2015-12-15 ENCOUNTER — Encounter: Payer: Self-pay | Admitting: *Deleted

## 2015-12-27 DIAGNOSIS — Z79899 Other long term (current) drug therapy: Secondary | ICD-10-CM | POA: Diagnosis not present

## 2015-12-27 DIAGNOSIS — I4891 Unspecified atrial fibrillation: Secondary | ICD-10-CM | POA: Diagnosis not present

## 2015-12-30 ENCOUNTER — Telehealth: Payer: Self-pay | Admitting: *Deleted

## 2015-12-30 NOTE — Telephone Encounter (Signed)
Notes Recorded by Laurine Blazer, LPN on [id number] at 075-GRM PM EDT Patient notified via voice mail. Copy to pmd. ------  Notes Recorded by [name], MD on 12/28/2015 at 2:52 PM EDT Sodium and magnesium minimally elevated. Send to PCP.

## 2016-01-11 ENCOUNTER — Other Ambulatory Visit: Payer: Self-pay | Admitting: Cardiovascular Disease

## 2016-02-03 DIAGNOSIS — G4733 Obstructive sleep apnea (adult) (pediatric): Secondary | ICD-10-CM | POA: Diagnosis not present

## 2016-02-06 DIAGNOSIS — Z6841 Body Mass Index (BMI) 40.0 and over, adult: Secondary | ICD-10-CM | POA: Diagnosis not present

## 2016-02-06 DIAGNOSIS — Z1389 Encounter for screening for other disorder: Secondary | ICD-10-CM | POA: Diagnosis not present

## 2016-02-06 DIAGNOSIS — J209 Acute bronchitis, unspecified: Secondary | ICD-10-CM | POA: Diagnosis not present

## 2016-02-06 DIAGNOSIS — J069 Acute upper respiratory infection, unspecified: Secondary | ICD-10-CM | POA: Diagnosis not present

## 2016-02-13 ENCOUNTER — Ambulatory Visit (HOSPITAL_COMMUNITY)
Admission: RE | Admit: 2016-02-13 | Discharge: 2016-02-13 | Disposition: A | Discharge status: Home / Self Care | Payer: BLUE CROSS/BLUE SHIELD | Source: Ambulatory Visit | Attending: Nurse Practitioner | Admitting: Nurse Practitioner

## 2016-02-13 ENCOUNTER — Encounter (HOSPITAL_COMMUNITY): Payer: Self-pay | Admitting: Nurse Practitioner

## 2016-02-13 VITALS — BP 140/90 | Ht 72.0 in | Wt 299.0 lb

## 2016-02-13 DIAGNOSIS — Z79899 Other long term (current) drug therapy: Secondary | ICD-10-CM | POA: Diagnosis not present

## 2016-02-13 DIAGNOSIS — K219 Gastro-esophageal reflux disease without esophagitis: Secondary | ICD-10-CM | POA: Insufficient documentation

## 2016-02-13 DIAGNOSIS — F17210 Nicotine dependence, cigarettes, uncomplicated: Secondary | ICD-10-CM | POA: Diagnosis not present

## 2016-02-13 DIAGNOSIS — I481 Persistent atrial fibrillation: Secondary | ICD-10-CM | POA: Insufficient documentation

## 2016-02-13 DIAGNOSIS — Z9889 Other specified postprocedural states: Secondary | ICD-10-CM | POA: Insufficient documentation

## 2016-02-13 DIAGNOSIS — Z8249 Family history of ischemic heart disease and other diseases of the circulatory system: Secondary | ICD-10-CM | POA: Diagnosis not present

## 2016-02-13 DIAGNOSIS — I4819 Other persistent atrial fibrillation: Secondary | ICD-10-CM

## 2016-02-13 DIAGNOSIS — Z823 Family history of stroke: Secondary | ICD-10-CM | POA: Diagnosis not present

## 2016-02-13 DIAGNOSIS — F329 Major depressive disorder, single episode, unspecified: Secondary | ICD-10-CM | POA: Insufficient documentation

## 2016-02-13 DIAGNOSIS — G4733 Obstructive sleep apnea (adult) (pediatric): Secondary | ICD-10-CM | POA: Insufficient documentation

## 2016-02-13 DIAGNOSIS — Z7901 Long term (current) use of anticoagulants: Secondary | ICD-10-CM | POA: Diagnosis not present

## 2016-02-13 DIAGNOSIS — I10 Essential (primary) hypertension: Secondary | ICD-10-CM | POA: Insufficient documentation

## 2016-02-13 DIAGNOSIS — Z888 Allergy status to other drugs, medicaments and biological substances status: Secondary | ICD-10-CM | POA: Insufficient documentation

## 2016-02-13 DIAGNOSIS — Z880 Allergy status to penicillin: Secondary | ICD-10-CM | POA: Insufficient documentation

## 2016-02-13 DIAGNOSIS — E669 Obesity, unspecified: Secondary | ICD-10-CM | POA: Insufficient documentation

## 2016-02-13 DIAGNOSIS — R634 Abnormal weight loss: Secondary | ICD-10-CM | POA: Diagnosis not present

## 2016-02-13 LAB — BASIC METABOLIC PANEL
Anion gap: 7 (ref 5–15)
BUN: 15 mg/dL (ref 6–20)
CHLORIDE: 106 mmol/L (ref 101–111)
CO2: 27 mmol/L (ref 22–32)
CREATININE: 1.03 mg/dL (ref 0.61–1.24)
Calcium: 8.9 mg/dL (ref 8.9–10.3)
GFR calc non Af Amer: >60 mL/min (ref >60)
Glucose, Bld: 95 mg/dL (ref 65–99)
POTASSIUM: 4.4 mmol/L (ref 3.5–5.1)
SODIUM: 140 mmol/L (ref 135–145)

## 2016-02-13 LAB — LIPID PANEL
CHOL/HDL RATIO: 3.7 ratio
CHOLESTEROL: 177 mg/dL (ref 0–200)
HDL: 48 mg/dL (ref >40)
LDL Cholesterol: 112 mg/dL — ABNORMAL HIGH (ref 0–99)
Triglycerides: 86 mg/dL (ref <150)
VLDL: 17 mg/dL (ref 0–40)

## 2016-02-13 LAB — MAGNESIUM: MAGNESIUM: 2 mg/dL (ref 1.7–2.4)

## 2016-02-13 NOTE — Progress Notes (Signed)
Patient ID: Sean Morales, male   DOB: 1960-11-28, 55 y.o.   MRN: [id number]     Primary Care Physician: [place] Referring Physician: [facility] f/u EP: Dr. Merlene Morse is a 55 y.o. male with a h/o persistent afib on tikosyn therapy. He has not had any significant afib.He feels improved. Precautions re Phyllis Ginger use discussed with pt.  Qtc stable at 469 ms. No bleeding issues with xarelto.  Today, he denies symptoms of palpitations, chest pain, shortness of breath, orthopnea, PND, lower extremity edema, dizziness, presyncope, syncope, or neurologic sequela. The patient is tolerating medications without difficulties and is otherwise without complaint today.   Past Medical History:  Diagnosis Date  . Depression   . DJD (degenerative joint disease)   . Hypertension   . Obstructive sleep apnea   . Overweight   . Persistent atrial fibrillation ([place])   . Reflux    Past Surgical History:  Procedure Laterality Date  . APPENDECTOMY    . CARDIOVERSION N/A 01/20/2015   Procedure: CARDIOVERSION;  Surgeon: [name], MD;  Location: [facility];  Service: Cardiovascular;  Laterality: N/A;  . knee surgety    . TONSILLECTOMY     age 34    Current Outpatient Prescriptions  Medication Sig Dispense Refill  . acetaminophen (TYLENOL) 500 MG tablet Take 1,000-1,500 mg by mouth every 6 (six) hours as needed for headache.    Marland Kitchen amLODipine (NORVASC) 10 MG tablet Take 1 tablet (10 mg total) by mouth daily.    Marland Kitchen dofetilide (TIKOSYN) 500 MCG capsule Take 1 capsule (500 mcg total) by mouth 2 (two) times daily. 60 capsule 11  . Hydrocodone-Chlorpheniramine 5-4 MG/5ML SOLN Take by mouth.    . levofloxacin (LEVAQUIN) 500 MG tablet Take 500 mg by mouth daily.    Marland Kitchen losartan (COZAAR) 100 MG tablet Take 100 mg by mouth daily.     . Magnesium 250 MG TABS Take 1 tablet by mouth daily.    . metoprolol tartrate (LOPRESSOR) 25 MG tablet TAKE 1 TABLET (25 MG TOTAL) BY MOUTH 2  (TWO) TIMES DAILY. 180 tablet 3  . omeprazole (PRILOSEC) 40 MG capsule Take 1 capsule by mouth daily.    . rivaroxaban (XARELTO) 20 MG TABS tablet Take 1 tablet (20 mg total) by mouth daily with supper. 30 tablet 11  . acetaminophen-codeine (TYLENOL #3) 300-30 MG tablet Take 1 tablet by mouth every 8 (eight) hours as needed for severe pain.      No current facility-administered medications for this encounter.     Allergies  Allergen Reactions  . Bee Venom Anaphylaxis    [place] Hornet  . Other     Copyronil per patient  . Penicillins Other (See Comments)    Unknown. Childhood allergy. Has patient had a PCN reaction causing immediate rash, facial/tongue/throat swelling, SOB or lightheadedness with hypotension: Unknown Has patient had a PCN reaction causing severe rash involving mucus membranes or skin necrosis: Unknown Has patient had a PCN reaction that required hospitalization: Unknown Has patient had a PCN reaction occurring within the last 10 years: No If all of the above answers are "NO", then may proceed with Cephalosporin use.   . Sulfa Antibiotics Other (See Comments)    Unknown. Childhood allergy.  . Tetracyclines & Related Other (See Comments)    Unknown. Childhood allergy.    Social History   Social History  . Marital status: Married    Spouse name: [place]  . Number of children:  4  . Years of education: 62   Occupational History  .      [employer]   Social History Main Topics  . Smoking status: Current Every Day Smoker    Packs/day: 0.50    Years: 25.00    Types: Cigarettes    Start date: 01/08/1977  . Smokeless tobacco: Never Used  . Alcohol use No     Comment: rarely  . Drug use: No  . Sexual activity: Yes   Other Topics Concern  . Not on file   Social History Narrative   Lives in [place] (near [place]) with wife.   Patient 1 cup of coffee daily,occas. 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       Family History    Problem Relation Age of Onset  . Deep vein thrombosis Father   . Stroke Mother     ROS- All systems are reviewed and negative except as per the HPI above  Physical Exam: Vitals:   02/13/16 1134  BP: 140/90  Weight: 299 lb (135.6 kg)  Height: 6' (1.829 m)    GEN- The patient is well appearing, alert and oriented x 3 today.   Head- normocephalic, atraumatic Eyes-  Sclera clear, conjunctiva pink Ears- hearing intact Oropharynx- clear Neck- supple, no JVP Lymph- no cervical lymphadenopathy Lungs- Clear to ausculation bilaterally, normal work of breathing Heart- Regular rate and rhythm, no murmurs, rubs or gallops, PMI not laterally displaced GI- soft, NT, ND, + BS Extremities- no clubbing, cyanosis, or edema MS- no significant deformity or atrophy Skin- no rash or lesion Psych- euthymic mood, full affect Neuro- strength and sensation are intact  EKG- Sinus rhythm with first degree block,Pr itn 252 ms, QRS int 84 ms, qtc 465 ms (stable)  Assessment and Plan: 1. Persistent afib Doing well on tikosyn, staying in SR.  Continue tikosyn 500 mg bid Continue metoprolol Continue xarelto Bmet/mag today Cholesterol profile drawn for pt(fasting) as part of a wellness profile  2. HTN Stable  3. Obesity Working on weight loss  F/u in 3 months  Butch Penny C. Korena Nass, [facility] [address] [place], [place] [address] [phone]

## 2016-03-01 DIAGNOSIS — D225 Melanocytic nevi of trunk: Secondary | ICD-10-CM | POA: Diagnosis not present

## 2016-03-01 DIAGNOSIS — L918 Other hypertrophic disorders of the skin: Secondary | ICD-10-CM | POA: Diagnosis not present

## 2016-03-01 DIAGNOSIS — Z1283 Encounter for screening for malignant neoplasm of skin: Secondary | ICD-10-CM | POA: Diagnosis not present

## 2016-03-01 DIAGNOSIS — D485 Neoplasm of uncertain behavior of skin: Secondary | ICD-10-CM | POA: Diagnosis not present

## 2016-03-16 ENCOUNTER — Telehealth (HOSPITAL_COMMUNITY): Payer: Self-pay | Admitting: *Deleted

## 2016-03-16 NOTE — Telephone Encounter (Signed)
Pt called in stating he has noticed for the last week feeling like his "heart is going to pound out of chest" at times especially when sitting still on the weekends. He notices some chest pressure when this occurs but no SOB, arm/neck pain or diaphoresis. Pt thinks he may be going in and out of afib. Appt made for Monday - ER precautions reviewed with pt. Pt verbalized understanding.

## 2016-03-19 ENCOUNTER — Encounter (HOSPITAL_COMMUNITY): Payer: Self-pay | Admitting: Nurse Practitioner

## 2016-03-19 ENCOUNTER — Ambulatory Visit (HOSPITAL_COMMUNITY)
Admission: RE | Admit: 2016-03-19 | Discharge: 2016-03-19 | Disposition: A | Discharge status: Home / Self Care | Payer: BLUE CROSS/BLUE SHIELD | Source: Ambulatory Visit | Attending: Nurse Practitioner | Admitting: Nurse Practitioner

## 2016-03-19 VITALS — BP 150/98 | HR 69 | Ht 72.0 in | Wt 303.2 lb

## 2016-03-19 DIAGNOSIS — Z823 Family history of stroke: Secondary | ICD-10-CM | POA: Diagnosis not present

## 2016-03-19 DIAGNOSIS — Z880 Allergy status to penicillin: Secondary | ICD-10-CM | POA: Diagnosis not present

## 2016-03-19 DIAGNOSIS — E669 Obesity, unspecified: Secondary | ICD-10-CM | POA: Diagnosis not present

## 2016-03-19 DIAGNOSIS — Z7901 Long term (current) use of anticoagulants: Secondary | ICD-10-CM | POA: Diagnosis not present

## 2016-03-19 DIAGNOSIS — R002 Palpitations: Secondary | ICD-10-CM | POA: Diagnosis not present

## 2016-03-19 DIAGNOSIS — G4733 Obstructive sleep apnea (adult) (pediatric): Secondary | ICD-10-CM | POA: Insufficient documentation

## 2016-03-19 DIAGNOSIS — M1990 Unspecified osteoarthritis, unspecified site: Secondary | ICD-10-CM | POA: Diagnosis not present

## 2016-03-19 DIAGNOSIS — Z8249 Family history of ischemic heart disease and other diseases of the circulatory system: Secondary | ICD-10-CM | POA: Insufficient documentation

## 2016-03-19 DIAGNOSIS — F17210 Nicotine dependence, cigarettes, uncomplicated: Secondary | ICD-10-CM | POA: Diagnosis not present

## 2016-03-19 DIAGNOSIS — Z888 Allergy status to other drugs, medicaments and biological substances status: Secondary | ICD-10-CM | POA: Insufficient documentation

## 2016-03-19 DIAGNOSIS — R200 Anesthesia of skin: Secondary | ICD-10-CM | POA: Diagnosis not present

## 2016-03-19 DIAGNOSIS — I10 Essential (primary) hypertension: Secondary | ICD-10-CM | POA: Insufficient documentation

## 2016-03-19 DIAGNOSIS — I4891 Unspecified atrial fibrillation: Secondary | ICD-10-CM

## 2016-03-19 DIAGNOSIS — K219 Gastro-esophageal reflux disease without esophagitis: Secondary | ICD-10-CM | POA: Insufficient documentation

## 2016-03-19 DIAGNOSIS — F329 Major depressive disorder, single episode, unspecified: Secondary | ICD-10-CM | POA: Diagnosis not present

## 2016-03-19 DIAGNOSIS — Z79899 Other long term (current) drug therapy: Secondary | ICD-10-CM | POA: Diagnosis not present

## 2016-03-19 DIAGNOSIS — I481 Persistent atrial fibrillation: Secondary | ICD-10-CM | POA: Insufficient documentation

## 2016-03-19 DIAGNOSIS — Z9889 Other specified postprocedural states: Secondary | ICD-10-CM | POA: Diagnosis not present

## 2016-03-19 NOTE — Progress Notes (Signed)
Patient ID: HALBERT REZNICK, male   DOB: 1961/03/23, 55 y.o.   MRN: [id number]     Primary Care Physician: [place] Referring Physician: [facility] f/u EP: Dr. Merlene Morse is a 55 y.o. male with a h/o persistent afib on tikosyn therapy. He has not had any significant afib.He feels improved. Precautions re Phyllis Ginger use discussed with pt.  Qtc stable at 469 ms. No bleeding issues with xarelto.  F/u in afib clinic 12/18. Pt asked to be seen for  multi complaints, does not sound specific to afb. He states that occasionally, he will wake up with both arms numb. Within  minutes of waking up, this will resolve. This is not associated with a difference in heart rhythm. He also describes a warm sensation to his head at times. He feels that way today with EKG confirming SR. He then describes usually with exertion, he will feel his heart pounding for around 10 minutes. Not symptomatic at rest. He does admit to being sedentary. Lat stress test was in 10/16 and was normal. Occasional will feel a twinge in his chest with the pounding.  Today, he denies symptoms  of shortness of breath, orthopnea, PND, lower extremity edema, dizziness, presyncope, syncope, or neurologic sequela.Positive of symptoms as  listed above.. The patient is tolerating medications without difficulties and is otherwise without complaint today.   Past Medical History:  Diagnosis Date  . Depression   . DJD (degenerative joint disease)   . Hypertension   . Obstructive sleep apnea   . Overweight   . Persistent atrial fibrillation ([place])   . Reflux    Past Surgical History:  Procedure Laterality Date  . APPENDECTOMY    . CARDIOVERSION N/A 01/20/2015   Procedure: CARDIOVERSION;  Surgeon: [name], MD;  Location: [facility];  Service: Cardiovascular;  Laterality: N/A;  . knee surgety    . TONSILLECTOMY     age 48    Current Outpatient Prescriptions  Medication Sig Dispense Refill  .  acetaminophen (TYLENOL) 500 MG tablet Take 1,000-1,500 mg by mouth every 6 (six) hours as needed for headache.    Marland Kitchen amLODipine (NORVASC) 10 MG tablet Take 1 tablet (10 mg total) by mouth daily.    Marland Kitchen dofetilide (TIKOSYN) 500 MCG capsule Take 1 capsule (500 mcg total) by mouth 2 (two) times daily. 60 capsule 11  . losartan (COZAAR) 100 MG tablet Take 100 mg by mouth daily.     . Magnesium 250 MG TABS Take 1 tablet by mouth daily.    . metoprolol tartrate (LOPRESSOR) 25 MG tablet TAKE 1 TABLET (25 MG TOTAL) BY MOUTH 2 (TWO) TIMES DAILY. 180 tablet 3  . omeprazole (PRILOSEC) 40 MG capsule Take 1 capsule by mouth daily.    . rivaroxaban (XARELTO) 20 MG TABS tablet Take 1 tablet (20 mg total) by mouth daily with supper. 30 tablet 11  . acetaminophen-codeine (TYLENOL #3) 300-30 MG tablet Take 1 tablet by mouth every 8 (eight) hours as needed for severe pain.      No current facility-administered medications for this encounter.     Allergies  Allergen Reactions  . Bee Venom Anaphylaxis    [place] Hornet  . Other     Copyronil per patient  . Penicillins Other (See Comments)    Unknown. Childhood allergy. Has patient had a PCN reaction causing immediate rash, facial/tongue/throat swelling, SOB or lightheadedness with hypotension: Unknown Has patient had a PCN reaction causing severe rash involving  mucus membranes or skin necrosis: Unknown Has patient had a PCN reaction that required hospitalization: Unknown Has patient had a PCN reaction occurring within the last 10 years: No If all of the above answers are "NO", then may proceed with Cephalosporin use.   . Sulfa Antibiotics Other (See Comments)    Unknown. Childhood allergy.  . Tetracyclines & Related Other (See Comments)    Unknown. Childhood allergy.    Social History   Social History  . Marital status: Married    Spouse name: [place]  . Number of children: 4  . Years of education: 14   Occupational History  .      [employer]   Social History Main Topics  . Smoking status: Current Every Day Smoker    Packs/day: 0.50    Years: 25.00    Types: Cigarettes    Start date: 01/08/1977  . Smokeless tobacco: Never Used  . Alcohol use No     Comment: rarely  . Drug use: No  . Sexual activity: Yes   Other Topics Concern  . Not on file   Social History Narrative   Lives in [place] (near [place]) with wife.   Patient 1 cup of coffee daily,occas. 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       Family History  Problem Relation Age of Onset  . Deep vein thrombosis Father   . Stroke Mother     ROS- All systems are reviewed and negative except as per the HPI above  Physical Exam: Vitals:   03/19/16 1042  BP: (!) 150/98  Pulse: 69  Weight: (!) 303 lb 3.2 oz (137.5 kg)  Height: 6' (1.829 m)    GEN- The patient is well appearing, alert and oriented x 3 today.   Head- normocephalic, atraumatic Eyes-  Sclera clear, conjunctiva pink Ears- hearing intact Oropharynx- clear Neck- supple, no JVP Lymph- no cervical lymphadenopathy Lungs- Clear to ausculation bilaterally, normal work of breathing Heart- Regular rate and rhythm, no murmurs, rubs or gallops, PMI not laterally displaced GI- soft, NT, ND, + BS Extremities- no clubbing, cyanosis, or edema MS- no significant deformity or atrophy Skin- no rash or lesion Psych- euthymic mood, full affect Neuro- strength and sensation are intact  EKG- Sinus rhythm with first degree block,Pr itn 248 ms, QRS int 82 ms, qtc 458 ms (stable)  Assessment and Plan: 1. Persistent afib Doing well on tikosyn, staying in SR, but feels heart pounding intermittently usually less than 10 mins.  Continue tikosyn 500 mg bid Continue metoprolol Continue xarelto Bmet/mag one month ago and in range Will place an event monitor  2. HTN Stable  3. Obesity Encouraged to lose weight, develop regular exercise  Symptoms could be from obesity and  deconditioning  4. Bilateral arm numbness Sounds positional with sleep Advised to speak to PCP about this  F/u as see results of monitor come in.  Geroge Baseman Shenita Trego, [facility] [address] [place], [place] [address] [phone]

## 2016-03-22 DIAGNOSIS — L989 Disorder of the skin and subcutaneous tissue, unspecified: Secondary | ICD-10-CM | POA: Diagnosis not present

## 2016-03-22 DIAGNOSIS — D485 Neoplasm of uncertain behavior of skin: Secondary | ICD-10-CM | POA: Diagnosis not present

## 2016-03-27 ENCOUNTER — Ambulatory Visit (INDEPENDENT_AMBULATORY_CARE_PROVIDER_SITE_OTHER): Payer: BLUE CROSS/BLUE SHIELD

## 2016-03-27 DIAGNOSIS — I4891 Unspecified atrial fibrillation: Secondary | ICD-10-CM

## 2016-04-17 ENCOUNTER — Telehealth: Payer: Self-pay | Admitting: Internal Medicine

## 2016-04-17 NOTE — Telephone Encounter (Signed)
Spoke with Cristal at [facility]. Pt had a sustain A-fib at 70 beats/minute and a run of 6  beats VT rate of 187. This happened on 11:55 AM. Pt has history of persistent A-Fib 1:20 PM Spoke with pt. He states did not feel anything when this event happened pt did not feel anything. Pt states he is doing fine. Dr. Lovena Le DOD aware.

## 2016-04-17 NOTE — Telephone Encounter (Signed)
Crystal ( [facility]) calling with a Critcal EKG

## 2016-04-17 NOTE — Telephone Encounter (Signed)
Follow Up   Crystal with [facility] calling about Critical EKG

## 2016-05-14 ENCOUNTER — Encounter (HOSPITAL_COMMUNITY): Payer: Self-pay | Admitting: Nurse Practitioner

## 2016-05-14 ENCOUNTER — Ambulatory Visit (HOSPITAL_COMMUNITY)
Admission: RE | Admit: 2016-05-14 | Discharge: 2016-05-14 | Disposition: A | Discharge status: Home / Self Care | Payer: BLUE CROSS/BLUE SHIELD | Source: Ambulatory Visit | Attending: Nurse Practitioner | Admitting: Nurse Practitioner

## 2016-05-14 VITALS — BP 132/94 | HR 66 | Ht 72.0 in | Wt 305.0 lb

## 2016-05-14 DIAGNOSIS — I4891 Unspecified atrial fibrillation: Secondary | ICD-10-CM

## 2016-05-14 DIAGNOSIS — I481 Persistent atrial fibrillation: Secondary | ICD-10-CM | POA: Insufficient documentation

## 2016-05-14 DIAGNOSIS — G4733 Obstructive sleep apnea (adult) (pediatric): Secondary | ICD-10-CM | POA: Insufficient documentation

## 2016-05-14 DIAGNOSIS — Z888 Allergy status to other drugs, medicaments and biological substances status: Secondary | ICD-10-CM | POA: Insufficient documentation

## 2016-05-14 DIAGNOSIS — F17210 Nicotine dependence, cigarettes, uncomplicated: Secondary | ICD-10-CM | POA: Insufficient documentation

## 2016-05-14 DIAGNOSIS — Z880 Allergy status to penicillin: Secondary | ICD-10-CM | POA: Insufficient documentation

## 2016-05-14 DIAGNOSIS — M1990 Unspecified osteoarthritis, unspecified site: Secondary | ICD-10-CM | POA: Insufficient documentation

## 2016-05-14 DIAGNOSIS — E669 Obesity, unspecified: Secondary | ICD-10-CM | POA: Insufficient documentation

## 2016-05-14 DIAGNOSIS — Z823 Family history of stroke: Secondary | ICD-10-CM | POA: Insufficient documentation

## 2016-05-14 DIAGNOSIS — I10 Essential (primary) hypertension: Secondary | ICD-10-CM | POA: Insufficient documentation

## 2016-05-14 DIAGNOSIS — Z9889 Other specified postprocedural states: Secondary | ICD-10-CM | POA: Insufficient documentation

## 2016-05-14 DIAGNOSIS — Z7901 Long term (current) use of anticoagulants: Secondary | ICD-10-CM | POA: Diagnosis not present

## 2016-05-14 DIAGNOSIS — Z79899 Other long term (current) drug therapy: Secondary | ICD-10-CM | POA: Diagnosis not present

## 2016-05-14 DIAGNOSIS — K219 Gastro-esophageal reflux disease without esophagitis: Secondary | ICD-10-CM | POA: Diagnosis not present

## 2016-05-14 DIAGNOSIS — F329 Major depressive disorder, single episode, unspecified: Secondary | ICD-10-CM | POA: Diagnosis not present

## 2016-05-14 DIAGNOSIS — Z8249 Family history of ischemic heart disease and other diseases of the circulatory system: Secondary | ICD-10-CM | POA: Insufficient documentation

## 2016-05-14 LAB — BASIC METABOLIC PANEL
ANION GAP: 8 (ref 5–15)
BUN: 11 mg/dL (ref 6–20)
CHLORIDE: 103 mmol/L (ref 101–111)
CO2: 28 mmol/L (ref 22–32)
Calcium: 8.8 mg/dL — ABNORMAL LOW (ref 8.9–10.3)
Creatinine, Ser: 1.01 mg/dL (ref 0.61–1.24)
GFR calc Af Amer: >60 mL/min (ref >60)
GFR calc non Af Amer: >60 mL/min (ref >60)
GLUCOSE: 105 mg/dL — AB (ref 65–99)
POTASSIUM: 4.2 mmol/L (ref 3.5–5.1)
Sodium: 139 mmol/L (ref 135–145)

## 2016-05-14 LAB — MAGNESIUM: Magnesium: 2 mg/dL (ref 1.7–2.4)

## 2016-05-14 NOTE — Progress Notes (Signed)
Patient ID: Sean Morales, male   DOB: 1960/08/17, 56 y.o.   MRN: [id number]     Primary Care Physician: [place] Referring Physician: [facility] f/u EP: Dr. Merlene Morse is a 56 y.o. male with a h/o persistent afib on tikosyn therapy. He has not had any significant afib.He feels improved. Precautions re Phyllis Ginger use discussed with pt.  Qtc stable at 469 ms. No bleeding issues with xarelto.  F/u in afib clinic 12/18. Pt asked to be seen for  multi complaints, does not sound specific to afb. He states that occasionally, he will wake up with both arms numb. Within  minutes of waking up, this will resolve. This is not associated with a difference in heart rhythm. He also describes a warm sensation to his head at times. He feels that way today with EKG confirming SR. He then describes usually with exertion, he will feel his heart pounding for around 10 minutes. Not symptomatic at rest. He does admit to being sedentary. Last stress test was in 10/16 and was normal. Occasional will feel a twinge in his chest with the pounding.  He follows up from last visit after monitor was worn. For the most part, it showed SR, some afib but not high burden. One episode of a 6 sec run of afib with aberrancy vrs v tach. He was asymptomatic. Some of the above symptoms mentioned at last visit have resolved.   Today, he denies symptoms  of shortness of breath, orthopnea, PND, lower extremity edema, dizziness, presyncope, syncope, or neurologic sequela.Positive of symptoms as  listed above.. The patient is tolerating medications without difficulties and is otherwise without complaint today.   Past Medical History:  Diagnosis Date  . Depression   . DJD (degenerative joint disease)   . Hypertension   . Obstructive sleep apnea   . Overweight   . Persistent atrial fibrillation ([place])   . Reflux    Past Surgical History:  Procedure Laterality Date  . APPENDECTOMY    . CARDIOVERSION N/A  01/20/2015   Procedure: CARDIOVERSION;  Surgeon: [name], MD;  Location: [facility];  Service: Cardiovascular;  Laterality: N/A;  . knee surgety    . TONSILLECTOMY     age 66    Current Outpatient Prescriptions  Medication Sig Dispense Refill  . acetaminophen (TYLENOL) 500 MG tablet Take 1,000-1,500 mg by mouth every 6 (six) hours as needed for headache.    Marland Kitchen acetaminophen-codeine (TYLENOL #3) 300-30 MG tablet Take 1 tablet by mouth every 8 (eight) hours as needed for severe pain.     Marland Kitchen amLODipine (NORVASC) 10 MG tablet Take 1 tablet (10 mg total) by mouth daily.    Marland Kitchen dofetilide (TIKOSYN) 500 MCG capsule Take 1 capsule (500 mcg total) by mouth 2 (two) times daily. 60 capsule 11  . losartan (COZAAR) 100 MG tablet Take 100 mg by mouth daily.     . Magnesium 250 MG TABS Take 1 tablet by mouth daily.    . metoprolol tartrate (LOPRESSOR) 25 MG tablet TAKE 1 TABLET (25 MG TOTAL) BY MOUTH 2 (TWO) TIMES DAILY. 180 tablet 3  . omeprazole (PRILOSEC) 40 MG capsule Take 1 capsule by mouth daily.    . rivaroxaban (XARELTO) 20 MG TABS tablet Take 1 tablet (20 mg total) by mouth daily with supper. 30 tablet 11   No current facility-administered medications for this encounter.     Allergies  Allergen Reactions  . Bee Venom Anaphylaxis  [place]  . Other     Copyronil per patient  . Penicillins Other (See Comments)    Unknown. Childhood allergy. Has patient had a PCN reaction causing immediate rash, facial/tongue/throat swelling, SOB or lightheadedness with hypotension: Unknown Has patient had a PCN reaction causing severe rash involving mucus membranes or skin necrosis: Unknown Has patient had a PCN reaction that required hospitalization: Unknown Has patient had a PCN reaction occurring within the last 10 years: No If all of the above answers are "NO", then may proceed with Cephalosporin use.   . Sulfa Antibiotics Other (See Comments)    Unknown. Childhood allergy.  .  Tetracyclines & Related Other (See Comments)    Unknown. Childhood allergy.    Social History   Social History  . Marital status: Married    Spouse name: [place]  . Number of children: 4  . Years of education: 14   Occupational History  .      [employer]   Social History Main Topics  . Smoking status: Current Every Day Smoker    Packs/day: 0.50    Years: 25.00    Types: Cigarettes    Start date: 01/08/1977  . Smokeless tobacco: Never Used  . Alcohol use No     Comment: rarely  . Drug use: No  . Sexual activity: Yes   Other Topics Concern  . Not on file   Social History Narrative   Lives in [place] (near [place]) with wife.   Patient 1 cup of coffee daily,occas. 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       Family History  Problem Relation Age of Onset  . Deep vein thrombosis Father   . Stroke Mother     ROS- All systems are reviewed and negative except as per the HPI above  Physical Exam: Vitals:   05/14/16 1122  BP: (!) 132/94  Pulse: 66  Weight: (!) 305 lb (138.3 kg)  Height: 6' (1.829 m)    GEN- The patient is well appearing, alert and oriented x 3 today.   Head- normocephalic, atraumatic Eyes-  Sclera clear, conjunctiva pink Ears- hearing intact Oropharynx- clear Neck- supple, no JVP Lymph- no cervical lymphadenopathy Lungs- Clear to ausculation bilaterally, normal work of breathing Heart- Regular rate and rhythm, no murmurs, rubs or gallops, PMI not laterally displaced GI- soft, NT, ND, + BS Extremities- no clubbing, cyanosis, or edema MS- no significant deformity or atrophy Skin- no rash or lesion Psych- euthymic mood, full affect Neuro- strength and sensation are intact  EKG- Sinus rhythm with first degree block,Pr itn 252 ms, QRS int 82 ms, qtc 459 ms (stable) Event monitor results reviewed  Assessment and Plan: 1. Persistent afib Symptoms have resolved from last visit Doing well on tikosyn, staying in SR   Continue tikosyn 500 mg bid Continue metoprolol Continue xarelto Bmet/mag    2. HTN Stable  3. Obesity Encouraged to lose weight, develop regular exercise   F/u in afib clinic in 3 months  Butch Penny C. Colena Ketterman, [facility] [address] [place], [place] [address] [phone]

## 2016-06-02 ENCOUNTER — Other Ambulatory Visit: Payer: Self-pay | Admitting: Physician Assistant

## 2016-06-08 DIAGNOSIS — M25561 Pain in right knee: Secondary | ICD-10-CM | POA: Diagnosis not present

## 2016-06-08 DIAGNOSIS — M170 Bilateral primary osteoarthritis of knee: Secondary | ICD-10-CM | POA: Diagnosis not present

## 2016-06-08 DIAGNOSIS — M25562 Pain in left knee: Secondary | ICD-10-CM | POA: Diagnosis not present

## 2016-06-11 ENCOUNTER — Encounter (HOSPITAL_COMMUNITY): Payer: BLUE CROSS/BLUE SHIELD

## 2016-06-28 DIAGNOSIS — Z719 Counseling, unspecified: Secondary | ICD-10-CM | POA: Diagnosis not present

## 2016-06-28 DIAGNOSIS — Z6841 Body Mass Index (BMI) 40.0 and over, adult: Secondary | ICD-10-CM | POA: Diagnosis not present

## 2016-06-28 DIAGNOSIS — Z1389 Encounter for screening for other disorder: Secondary | ICD-10-CM | POA: Diagnosis not present

## 2016-06-28 DIAGNOSIS — J302 Other seasonal allergic rhinitis: Secondary | ICD-10-CM | POA: Diagnosis not present

## 2016-07-02 ENCOUNTER — Other Ambulatory Visit: Payer: Self-pay | Admitting: Physician Assistant

## 2016-07-02 NOTE — Telephone Encounter (Signed)
Pt last seen [name] 11/2015. Sean Morales 05/14/16. Last Scr 1.01 from 05/14/16. Wt 134.7Kg from 11/09/15 from [name] appt.  CrCl 157.45 mL/min. Xarelto 20mg  QD refilled.

## 2016-07-19 DIAGNOSIS — Z1283 Encounter for screening for malignant neoplasm of skin: Secondary | ICD-10-CM | POA: Diagnosis not present

## 2016-07-19 DIAGNOSIS — L821 Other seborrheic keratosis: Secondary | ICD-10-CM | POA: Diagnosis not present

## 2016-07-20 DIAGNOSIS — M170 Bilateral primary osteoarthritis of knee: Secondary | ICD-10-CM | POA: Diagnosis not present

## 2016-07-26 DIAGNOSIS — M25561 Pain in right knee: Secondary | ICD-10-CM | POA: Diagnosis not present

## 2016-07-26 DIAGNOSIS — M170 Bilateral primary osteoarthritis of knee: Secondary | ICD-10-CM | POA: Diagnosis not present

## 2016-07-26 DIAGNOSIS — M25562 Pain in left knee: Secondary | ICD-10-CM | POA: Diagnosis not present

## 2016-08-02 DIAGNOSIS — M25562 Pain in left knee: Secondary | ICD-10-CM | POA: Diagnosis not present

## 2016-08-02 DIAGNOSIS — M170 Bilateral primary osteoarthritis of knee: Secondary | ICD-10-CM | POA: Diagnosis not present

## 2016-08-02 DIAGNOSIS — M25561 Pain in right knee: Secondary | ICD-10-CM | POA: Diagnosis not present

## 2016-08-13 ENCOUNTER — Ambulatory Visit (HOSPITAL_COMMUNITY): Payer: BLUE CROSS/BLUE SHIELD | Admitting: Nurse Practitioner

## 2016-08-20 ENCOUNTER — Ambulatory Visit (HOSPITAL_COMMUNITY)
Admission: RE | Admit: 2016-08-20 | Discharge: 2016-08-20 | Disposition: A | Discharge status: Home / Self Care | Payer: BLUE CROSS/BLUE SHIELD | Source: Ambulatory Visit | Attending: Nurse Practitioner | Admitting: Nurse Practitioner

## 2016-08-20 ENCOUNTER — Encounter (HOSPITAL_COMMUNITY): Payer: Self-pay | Admitting: Nurse Practitioner

## 2016-08-20 VITALS — BP 146/86 | HR 62 | Ht 72.0 in | Wt 299.6 lb

## 2016-08-20 DIAGNOSIS — F329 Major depressive disorder, single episode, unspecified: Secondary | ICD-10-CM | POA: Insufficient documentation

## 2016-08-20 DIAGNOSIS — I10 Essential (primary) hypertension: Secondary | ICD-10-CM | POA: Insufficient documentation

## 2016-08-20 DIAGNOSIS — I481 Persistent atrial fibrillation: Secondary | ICD-10-CM | POA: Diagnosis not present

## 2016-08-20 DIAGNOSIS — Z880 Allergy status to penicillin: Secondary | ICD-10-CM | POA: Insufficient documentation

## 2016-08-20 DIAGNOSIS — Z7901 Long term (current) use of anticoagulants: Secondary | ICD-10-CM | POA: Insufficient documentation

## 2016-08-20 DIAGNOSIS — E669 Obesity, unspecified: Secondary | ICD-10-CM | POA: Insufficient documentation

## 2016-08-20 DIAGNOSIS — F17210 Nicotine dependence, cigarettes, uncomplicated: Secondary | ICD-10-CM | POA: Diagnosis not present

## 2016-08-20 DIAGNOSIS — G4733 Obstructive sleep apnea (adult) (pediatric): Secondary | ICD-10-CM | POA: Diagnosis not present

## 2016-08-20 DIAGNOSIS — K219 Gastro-esophageal reflux disease without esophagitis: Secondary | ICD-10-CM | POA: Diagnosis not present

## 2016-08-20 DIAGNOSIS — I4819 Other persistent atrial fibrillation: Secondary | ICD-10-CM

## 2016-08-20 DIAGNOSIS — M1990 Unspecified osteoarthritis, unspecified site: Secondary | ICD-10-CM | POA: Insufficient documentation

## 2016-08-20 LAB — BASIC METABOLIC PANEL
Anion gap: 7 (ref 5–15)
BUN: 9 mg/dL (ref 6–20)
CALCIUM: 8.8 mg/dL — AB (ref 8.9–10.3)
CO2: 27 mmol/L (ref 22–32)
CREATININE: 0.86 mg/dL (ref 0.61–1.24)
Chloride: 104 mmol/L (ref 101–111)
GFR calc Af Amer: >60 mL/min (ref >60)
GLUCOSE: 93 mg/dL (ref 65–99)
POTASSIUM: 4 mmol/L (ref 3.5–5.1)
SODIUM: 138 mmol/L (ref 135–145)

## 2016-08-20 LAB — MAGNESIUM: MAGNESIUM: 2.2 mg/dL (ref 1.7–2.4)

## 2016-08-20 NOTE — Progress Notes (Signed)
Patient ID: Sean Morales, male   DOB: 06/10/1960, 56 y.o.   MRN: [id number]     Primary Care Physician: [facility] Referring Physician: [facility] f/u EP: Dr. Merlene Morse is a 56 y.o. male with a h/o persistent afib on tikosyn therapy. He has not had any significant afib.He feels improved. No bleeding issues with xarelto.  Today, he denies symptoms  of shortness of breath, orthopnea, PND, lower extremity edema, dizziness, presyncope, syncope, or neurologic sequela.Positive of symptoms as  listed above.. The patient is tolerating medications without difficulties and is otherwise without complaint today.   Past Medical History:  Diagnosis Date  . Depression   . DJD (degenerative joint disease)   . Hypertension   . Obstructive sleep apnea   . Overweight   . Persistent atrial fibrillation ([place])   . Reflux    Past Surgical History:  Procedure Laterality Date  . APPENDECTOMY    . CARDIOVERSION N/A 01/20/2015   Procedure: CARDIOVERSION;  Surgeon: [name], MD;  Location: [facility];  Service: Cardiovascular;  Laterality: N/A;  . knee surgety    . TONSILLECTOMY     age 29    Current Outpatient Prescriptions  Medication Sig Dispense Refill  . acetaminophen (TYLENOL) 500 MG tablet Take 1,000-1,500 mg by mouth every 6 (six) hours as needed for headache.    Marland Kitchen acetaminophen-codeine (TYLENOL #3) 300-30 MG tablet Take 1 tablet by mouth every 8 (eight) hours as needed for severe pain.     Marland Kitchen amLODipine (NORVASC) 10 MG tablet Take 1 tablet (10 mg total) by mouth daily.    Marland Kitchen dofetilide (TIKOSYN) 500 MCG capsule TAKE ONE CAPSULE BY MOUTH TWICE A DAY 60 capsule 6  . losartan (COZAAR) 100 MG tablet Take 100 mg by mouth daily.     . Magnesium 250 MG TABS Take 1 tablet by mouth daily.    . metoprolol tartrate (LOPRESSOR) 25 MG tablet TAKE 1 TABLET (25 MG TOTAL) BY MOUTH 2 (TWO) TIMES DAILY. 180 tablet 3  . omeprazole (PRILOSEC) 40 MG capsule Take 1 capsule by  mouth daily.    Alveda Reasons 20 MG TABS tablet TAKE 1 TABLET BY MOUTH EVERY DAY WITH DINNER 30 tablet 5   No current facility-administered medications for this encounter.     Allergies  Allergen Reactions  . Bee Venom Anaphylaxis    [place] Hornet  . Other     Copyronil per patient  . Penicillins Other (See Comments)    Unknown. Childhood allergy. Has patient had a PCN reaction causing immediate rash, facial/tongue/throat swelling, SOB or lightheadedness with hypotension: Unknown Has patient had a PCN reaction causing severe rash involving mucus membranes or skin necrosis: Unknown Has patient had a PCN reaction that required hospitalization: Unknown Has patient had a PCN reaction occurring within the last 10 years: No If all of the above answers are "NO", then may proceed with Cephalosporin use.   . Sulfa Antibiotics Other (See Comments)    Unknown. Childhood allergy.  . Tetracyclines & Related Other (See Comments)    Unknown. Childhood allergy.    Social History   Social History  . Marital status: Married    Spouse name: [place]  . Number of children: 4  . Years of education: 14   Occupational History  .      [employer]   Social History Main Topics  . Smoking status: Current Every Day Smoker    Packs/day: 0.50  Years: 25.00    Types: Cigarettes    Start date: 01/08/1977  . Smokeless tobacco: Never Used  . Alcohol use No     Comment: rarely  . Drug use: No  . Sexual activity: Yes   Other Topics Concern  . Not on file   Social History Narrative   Lives in [place] (near [place]) with wife.   Patient 1 cup of coffee daily,occas. 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       Family History  Problem Relation Age of Onset  . Deep vein thrombosis Father   . Stroke Mother     ROS- All systems are reviewed and negative except as per the HPI above  Physical Exam: Vitals:   08/20/16 1119  BP: (!) 146/86  Pulse: 62  Weight: 299  lb 9.6 oz (135.9 kg)  Height: 6' (1.829 m)    GEN- The patient is well appearing, alert and oriented x 3 today.   Head- normocephalic, atraumatic Eyes-  Sclera clear, conjunctiva pink Ears- hearing intact Oropharynx- clear Neck- supple, no JVP Lymph- no cervical lymphadenopathy Lungs- Clear to ausculation bilaterally, normal work of breathing Heart- Regular rate and rhythm, no murmurs, rubs or gallops, PMI not laterally displaced GI- soft, NT, ND, + BS Extremities- no clubbing, cyanosis, or edema MS- no significant deformity or atrophy Skin- no rash or lesion Psych- euthymic mood, full affect Neuro- strength and sensation are intact  EKG- Sinus rhythm with first degree block,Pr itn 270 ms, QRS int 84 ms, qtc 440 ms (stable) Event monitor results reviewed  Assessment and Plan: 1. Persistent afib Doing well on tikosyn, staying in SR  Continue tikosyn 500 mcg bid Continue metoprolol Continue xarelto Bmet/mag    2. HTN Stable  3. Obesity Encouraged to lose weight, develop regular exercise   F/u in afib clinic in 3 months  Butch Penny C. Nikiah Goin, [facility] [address] [place], [place] [address] [phone]

## 2016-09-14 DIAGNOSIS — M170 Bilateral primary osteoarthritis of knee: Secondary | ICD-10-CM | POA: Diagnosis not present

## 2016-09-14 DIAGNOSIS — M25561 Pain in right knee: Secondary | ICD-10-CM | POA: Diagnosis not present

## 2016-09-14 DIAGNOSIS — M25562 Pain in left knee: Secondary | ICD-10-CM | POA: Diagnosis not present

## 2016-11-12 DIAGNOSIS — I10 Essential (primary) hypertension: Secondary | ICD-10-CM | POA: Diagnosis not present

## 2016-11-12 DIAGNOSIS — Z1389 Encounter for screening for other disorder: Secondary | ICD-10-CM | POA: Diagnosis not present

## 2016-11-12 DIAGNOSIS — I4891 Unspecified atrial fibrillation: Secondary | ICD-10-CM | POA: Diagnosis not present

## 2016-11-12 DIAGNOSIS — S90862A Insect bite (nonvenomous), left foot, initial encounter: Secondary | ICD-10-CM | POA: Diagnosis not present

## 2016-11-12 DIAGNOSIS — Z6838 Body mass index (BMI) 38.0-38.9, adult: Secondary | ICD-10-CM | POA: Diagnosis not present

## 2016-11-14 DIAGNOSIS — G4733 Obstructive sleep apnea (adult) (pediatric): Secondary | ICD-10-CM | POA: Diagnosis not present

## 2016-11-19 ENCOUNTER — Ambulatory Visit (HOSPITAL_COMMUNITY): Payer: BLUE CROSS/BLUE SHIELD | Admitting: Nurse Practitioner

## 2016-11-22 ENCOUNTER — Inpatient Hospital Stay (HOSPITAL_COMMUNITY)
Admission: RE | Admit: 2016-11-22 | Payer: BLUE CROSS/BLUE SHIELD | Source: Ambulatory Visit | Admitting: Nurse Practitioner

## 2016-11-23 ENCOUNTER — Ambulatory Visit (HOSPITAL_COMMUNITY)
Admission: RE | Admit: 2016-11-23 | Discharge: 2016-11-23 | Disposition: A | Discharge status: Home / Self Care | Payer: BLUE CROSS/BLUE SHIELD | Source: Ambulatory Visit | Attending: Nurse Practitioner | Admitting: Nurse Practitioner

## 2016-11-23 ENCOUNTER — Encounter (HOSPITAL_COMMUNITY): Payer: Self-pay | Admitting: Nurse Practitioner

## 2016-11-23 VITALS — BP 126/82 | HR 60 | Ht 72.0 in | Wt 281.0 lb

## 2016-11-23 DIAGNOSIS — G4733 Obstructive sleep apnea (adult) (pediatric): Secondary | ICD-10-CM | POA: Diagnosis not present

## 2016-11-23 DIAGNOSIS — K219 Gastro-esophageal reflux disease without esophagitis: Secondary | ICD-10-CM | POA: Insufficient documentation

## 2016-11-23 DIAGNOSIS — F17210 Nicotine dependence, cigarettes, uncomplicated: Secondary | ICD-10-CM | POA: Diagnosis not present

## 2016-11-23 DIAGNOSIS — Z7902 Long term (current) use of antithrombotics/antiplatelets: Secondary | ICD-10-CM | POA: Insufficient documentation

## 2016-11-23 DIAGNOSIS — I10 Essential (primary) hypertension: Secondary | ICD-10-CM | POA: Insufficient documentation

## 2016-11-23 DIAGNOSIS — I481 Persistent atrial fibrillation: Secondary | ICD-10-CM | POA: Diagnosis not present

## 2016-11-23 DIAGNOSIS — E669 Obesity, unspecified: Secondary | ICD-10-CM | POA: Diagnosis not present

## 2016-11-23 DIAGNOSIS — Z91030 Bee allergy status: Secondary | ICD-10-CM | POA: Diagnosis not present

## 2016-11-23 DIAGNOSIS — Z9109 Other allergy status, other than to drugs and biological substances: Secondary | ICD-10-CM | POA: Diagnosis not present

## 2016-11-23 DIAGNOSIS — Z8249 Family history of ischemic heart disease and other diseases of the circulatory system: Secondary | ICD-10-CM | POA: Insufficient documentation

## 2016-11-23 DIAGNOSIS — Z880 Allergy status to penicillin: Secondary | ICD-10-CM | POA: Insufficient documentation

## 2016-11-23 DIAGNOSIS — Z9889 Other specified postprocedural states: Secondary | ICD-10-CM | POA: Diagnosis not present

## 2016-11-23 DIAGNOSIS — Z882 Allergy status to sulfonamides status: Secondary | ICD-10-CM | POA: Diagnosis not present

## 2016-11-23 DIAGNOSIS — Z823 Family history of stroke: Secondary | ICD-10-CM | POA: Insufficient documentation

## 2016-11-23 DIAGNOSIS — I4819 Other persistent atrial fibrillation: Secondary | ICD-10-CM

## 2016-11-23 DIAGNOSIS — Z881 Allergy status to other antibiotic agents status: Secondary | ICD-10-CM | POA: Diagnosis not present

## 2016-11-23 DIAGNOSIS — M1990 Unspecified osteoarthritis, unspecified site: Secondary | ICD-10-CM | POA: Insufficient documentation

## 2016-11-23 LAB — BASIC METABOLIC PANEL
Anion gap: 6 (ref 5–15)
BUN: 8 mg/dL (ref 6–20)
CHLORIDE: 104 mmol/L (ref 101–111)
CO2: 29 mmol/L (ref 22–32)
CREATININE: 0.89 mg/dL (ref 0.61–1.24)
Calcium: 9 mg/dL (ref 8.9–10.3)
GFR calc Af Amer: >60 mL/min (ref >60)
GFR calc non Af Amer: >60 mL/min (ref >60)
Glucose, Bld: 91 mg/dL (ref 65–99)
Potassium: 4.4 mmol/L (ref 3.5–5.1)
SODIUM: 139 mmol/L (ref 135–145)

## 2016-11-23 LAB — MAGNESIUM: Magnesium: 2.1 mg/dL (ref 1.7–2.4)

## 2016-11-23 NOTE — Progress Notes (Signed)
Patient ID: Sean Morales, male   DOB: [birth year], 56 y.o.   MRN: [id number]     Primary Care Physician: [facility] Referring Physician: [facility] f/u EP: Dr. Merlene Morse is a 56 y.o. male with a h/o persistent afib on tikosyn therapy. He has not had any significant afib.He feels improved. No bleeding issues with xarelto.  F/u in afib clinic, 8/24, he feels well. He is starting to be more active because his knees are not bothering him as much and has lost 30 lbs and hopes to lose more. He and his wife have joined the [employer] and he hopes to be more active. Continues on tikosyn and xarelto without any afib or bleeding issues.  Today, he denies symptoms  of shortness of breath, orthopnea, PND, lower extremity edema, dizziness, presyncope, syncope, or neurologic sequela.Positive of symptoms as  listed above.. The patient is tolerating medications without difficulties and is otherwise without complaint today.   Past Medical History:  Diagnosis Date  . Depression   . DJD (degenerative joint disease)   . Hypertension   . Obstructive sleep apnea   . Overweight   . Persistent atrial fibrillation ([place])   . Reflux    Past Surgical History:  Procedure Laterality Date  . APPENDECTOMY    . CARDIOVERSION N/A 01/20/2015   Procedure: CARDIOVERSION;  Surgeon: [name], MD;  Location: [facility];  Service: Cardiovascular;  Laterality: N/A;  . knee surgety    . TONSILLECTOMY     age 52    Current Outpatient Prescriptions  Medication Sig Dispense Refill  . acetaminophen (TYLENOL) 500 MG tablet Take 1,000-1,500 mg by mouth every 6 (six) hours as needed for headache.    Marland Kitchen acetaminophen-codeine (TYLENOL #3) 300-30 MG tablet Take 1 tablet by mouth every 8 (eight) hours as needed for severe pain.     Marland Kitchen amLODipine (NORVASC) 10 MG tablet Take 1 tablet (10 mg total) by mouth daily.    Marland Kitchen dofetilide (TIKOSYN) 500 MCG capsule TAKE ONE CAPSULE BY MOUTH TWICE A DAY 60  capsule 6  . losartan (COZAAR) 100 MG tablet Take 100 mg by mouth daily.     . Magnesium 250 MG TABS Take 1 tablet by mouth daily.    . metoprolol tartrate (LOPRESSOR) 25 MG tablet TAKE 1 TABLET (25 MG TOTAL) BY MOUTH 2 (TWO) TIMES DAILY. 180 tablet 3  . omeprazole (PRILOSEC) 40 MG capsule Take 1 capsule by mouth daily.    Alveda Reasons 20 MG TABS tablet TAKE 1 TABLET BY MOUTH EVERY DAY WITH DINNER 30 tablet 5   No current facility-administered medications for this encounter.     Allergies  Allergen Reactions  . Bee Venom Anaphylaxis    [place] Hornet  . Other     Copyronil per patient  . Penicillins Other (See Comments)    Unknown. Childhood allergy. Has patient had a PCN reaction causing immediate rash, facial/tongue/throat swelling, SOB or lightheadedness with hypotension: Unknown Has patient had a PCN reaction causing severe rash involving mucus membranes or skin necrosis: Unknown Has patient had a PCN reaction that required hospitalization: Unknown Has patient had a PCN reaction occurring within the last 10 years: No If all of the above answers are "NO", then may proceed with Cephalosporin use.   . Sulfa Antibiotics Other (See Comments)    Unknown. Childhood allergy.  . Tetracyclines & Related Other (See Comments)    Unknown. Childhood allergy.    Social History  Social History  . Marital status: Married    Spouse name: [place]  . Number of children: 4  . Years of education: 14   Occupational History  .      [employer]   Social History Main Topics  . Smoking status: Current Every Day Smoker    Packs/day: 0.50    Years: 25.00    Types: Cigarettes    Start date: 01/08/1977  . Smokeless tobacco: Never Used  . Alcohol use No     Comment: rarely  . Drug use: No  . Sexual activity: Yes   Other Topics Concern  . Not on file   Social History Narrative   Lives in [place] (near [place]) with wife.   Patient 1 cup of coffee daily,occas. Soft drink    Operations supervisor for [employer]       Family History  Problem Relation Age of Onset  . Deep vein thrombosis Father   . Stroke Mother     ROS- All systems are reviewed and negative except as per the HPI above  Physical Exam: Vitals:   11/23/16 1122  BP: 126/82  Pulse: 60  Weight: 281 lb (127.5 kg)  Height: 6' (1.829 m)    GEN- The patient is well appearing, alert and oriented x 3 today.   Head- normocephalic, atraumatic Eyes-  Sclera clear, conjunctiva pink Ears- hearing intact Oropharynx- clear Neck- supple, no JVP Lymph- no cervical lymphadenopathy Lungs- Clear to ausculation bilaterally, normal work of breathing Heart- Regular rate and rhythm, no murmurs, rubs or gallops, PMI not laterally displaced GI- soft, NT, ND, + BS Extremities- no clubbing, cyanosis, or edema MS- no significant deformity or atrophy Skin- no rash or lesion Psych- euthymic mood, full affect Neuro- strength and sensation are intact  EKG- Sinus rhythm with first degree block,Pr itn 264 ms, QRS int 84 ms, qtc 442 ms (stable) Event monitor results reviewed  Assessment and Plan: 1. Persistent afib Doing well on tikosyn, staying in SR  Continue tikosyn 500 mcg bid Continue metoprolol Continue xarelto Bmet/mag    2. HTN Stable  3. Obesity Congratulated on weight loss and new exercise patterns and encouraged him to continue to loss more weight.  F/u in afib clinic in 4 months  Geroge Baseman. [place], [facility] [address] [place], [place] [address] [phone]

## 2016-12-02 DIAGNOSIS — S50862A Insect bite (nonvenomous) of left forearm, initial encounter: Secondary | ICD-10-CM | POA: Diagnosis not present

## 2016-12-24 ENCOUNTER — Other Ambulatory Visit: Payer: Self-pay

## 2016-12-24 ENCOUNTER — Other Ambulatory Visit (HOSPITAL_COMMUNITY): Payer: Self-pay | Admitting: *Deleted

## 2016-12-24 MED ORDER — DOFETILIDE 500 MCG PO CAPS: 500.0000 ug | ORAL_CAPSULE | Freq: Two times a day (BID) | ORAL | 1 refills | Status: DC

## 2016-12-24 MED ORDER — RIVAROXABAN 20 MG PO TABS: ORAL_TABLET | ORAL | 5 refills | Status: DC

## 2016-12-28 DIAGNOSIS — M170 Bilateral primary osteoarthritis of knee: Secondary | ICD-10-CM | POA: Diagnosis not present

## 2017-01-09 ENCOUNTER — Ambulatory Visit (INDEPENDENT_AMBULATORY_CARE_PROVIDER_SITE_OTHER): Payer: BLUE CROSS/BLUE SHIELD | Admitting: Podiatry

## 2017-01-09 ENCOUNTER — Other Ambulatory Visit: Payer: Self-pay | Admitting: Cardiovascular Disease

## 2017-01-09 VITALS — BP 147/90 | HR 65 | Ht 72.0 in | Wt 265.0 lb

## 2017-01-09 DIAGNOSIS — B351 Tinea unguium: Secondary | ICD-10-CM | POA: Diagnosis not present

## 2017-01-09 DIAGNOSIS — L600 Ingrowing nail: Secondary | ICD-10-CM | POA: Diagnosis not present

## 2017-01-09 NOTE — Progress Notes (Signed)
   Subjective:    Patient ID: Sean Morales, male    DOB: 1960/08/23, 56 y.o.   MRN: [id number]  HPI    Review of Systems  All other systems reviewed and are negative.      Objective:   Physical Exam        Assessment & Plan:

## 2017-01-09 NOTE — Patient Instructions (Signed)

## 2017-01-15 NOTE — Progress Notes (Signed)
   Subjective: Patient presents today for evaluation of pain in the medial borders of bilateral great toes. Patient is concerned for possible ingrown nail. Patient states that the pain has been present for a few weeks now. He also reports thickening of all nails of bilateral feet. Patient presents today for further treatment and evaluation.   Past Medical History:  Diagnosis Date  . Depression   . DJD (degenerative joint disease)   . Hypertension   . Obstructive sleep apnea   . Overweight   . Persistent atrial fibrillation ([place])   . Reflux     Objective:  General: Well developed, nourished, in no acute distress, alert and oriented x3   Dermatology: Skin is warm, dry and supple bilateral. Medial borders of bilateral great toes appears to be erythematous with evidence of an ingrowing nail. Pain on palpation noted to the border of the nail fold. Hyperkeratotic, discolored, thickened, onychodystrophy of nails noted bilaterally. The remaining nails appear unremarkable at this time. There are no open sores, lesions.  Vascular: Dorsalis Pedis artery and Posterior Tibial artery pedal pulses palpable. No lower extremity edema noted.   Neruologic: Grossly intact via light touch bilateral.  Musculoskeletal: Muscular strength within normal limits in all groups bilateral. Normal range of motion noted to all pedal and ankle joints.   Assesement: #1 Paronychia with ingrowing nail medial borders of bilateral great toes #2 Pain in toe #3 Incurvated nail #4 onychodystrophy bilateral toenails #5 possible onychomycosis #6 hyperkeratotic nails bilateral  Plan of Care:  1. Patient evaluated.  2. Discussed treatment alternatives and plan of care. Explained nail avulsion procedure and post procedure course to patient. 3. Patient opted for permanent partial nail avulsion.  4. Prior to procedure, local anesthesia infiltration utilized using 3 ml of a 50:50 mixture of 2% plain lidocaine and 0.5% plain  marcaine in a normal hallux block fashion and a betadine prep performed.  5. Partial permanent nail avulsion with chemical matrixectomy performed using [id number] applications of phenol followed by alcohol flush.  6. Light dressing applied. 7. Today nail biopsy was taken of the right 2nd toe and sent to pathology for fungal culture. 8. Return to clinic in 2 weeks.   Edrick Kins, DPM [facility]  Dr. Edrick Kins, [place]                                        [place], [place] [address]                Office [phone]  Fax [phone]

## 2017-01-23 ENCOUNTER — Encounter: Payer: Self-pay | Admitting: Podiatry

## 2017-01-23 ENCOUNTER — Ambulatory Visit (INDEPENDENT_AMBULATORY_CARE_PROVIDER_SITE_OTHER): Payer: BLUE CROSS/BLUE SHIELD | Admitting: Podiatry

## 2017-01-23 DIAGNOSIS — B351 Tinea unguium: Secondary | ICD-10-CM

## 2017-01-23 MED ORDER — TERBINAFINE HCL 250 MG PO TABS: 250.0000 mg | ORAL_TABLET | Freq: Every day | ORAL | 0 refills | Status: DC

## 2017-01-24 DIAGNOSIS — D485 Neoplasm of uncertain behavior of skin: Secondary | ICD-10-CM | POA: Diagnosis not present

## 2017-01-24 DIAGNOSIS — D225 Melanocytic nevi of trunk: Secondary | ICD-10-CM | POA: Diagnosis not present

## 2017-01-24 DIAGNOSIS — L821 Other seborrheic keratosis: Secondary | ICD-10-CM | POA: Diagnosis not present

## 2017-01-24 DIAGNOSIS — D1801 Hemangioma of skin and subcutaneous tissue: Secondary | ICD-10-CM | POA: Diagnosis not present

## 2017-01-24 DIAGNOSIS — L989 Disorder of the skin and subcutaneous tissue, unspecified: Secondary | ICD-10-CM | POA: Diagnosis not present

## 2017-01-24 DIAGNOSIS — L820 Inflamed seborrheic keratosis: Secondary | ICD-10-CM | POA: Diagnosis not present

## 2017-01-26 NOTE — Progress Notes (Signed)
   Subjective: Patient presents today 2 weeks post ingrown nail permanent nail avulsion procedure to the medial border of bilateral great toes. Patient states that the toe and nail fold is feeling much better. He has has adhered to review the results of the nail biopsy taken at last visit.   Past Medical History:  Diagnosis Date  . Depression   . DJD (degenerative joint disease)   . Hypertension   . Obstructive sleep apnea   . Overweight   . Persistent atrial fibrillation ([place])   . Reflux     Objective: Skin is warm, dry and supple. Nail and respective nail fold appears to be healing appropriately. Open wound to the associated nail fold with a granular wound base and moderate amount of fibrotic tissue. Minimal drainage noted. Mild erythema around the periungual region likely due to phenol chemical matricectomy. Hyperkeratotic, discolored, thickened, onychodystrophy of nails noted bilaterally.  Assessment: #1 postop permanent partial nail avulsion Medial border of bilateral great toes #2 open wound periungual nail fold of respective digit.   Plan of care: #1 patient was evaluated. Fungal culture results reviewed. #2 debridement of open wound was performed to the periungual border of the respective toe using a currette. Antibiotic ointment and Band-Aid was applied. #3 prescription for Lamisil 250 mg #90 given to patient. #4 orders for liver function tests placed. #5 return to clinic when necessary or if patient wants laser treatment.   Edrick Kins, DPM [facility]  Dr. Edrick Kins, [place]                                        [place], [place] [address]                Office [phone]  Fax [phone]

## 2017-02-06 DIAGNOSIS — Z6835 Body mass index (BMI) 35.0-35.9, adult: Secondary | ICD-10-CM | POA: Diagnosis not present

## 2017-02-06 DIAGNOSIS — Z719 Counseling, unspecified: Secondary | ICD-10-CM | POA: Diagnosis not present

## 2017-02-06 DIAGNOSIS — Z0000 Encounter for general adult medical examination without abnormal findings: Secondary | ICD-10-CM | POA: Diagnosis not present

## 2017-02-06 DIAGNOSIS — Z1389 Encounter for screening for other disorder: Secondary | ICD-10-CM | POA: Diagnosis not present

## 2017-02-06 DIAGNOSIS — E6609 Other obesity due to excess calories: Secondary | ICD-10-CM | POA: Diagnosis not present

## 2017-02-07 DIAGNOSIS — Z0000 Encounter for general adult medical examination without abnormal findings: Secondary | ICD-10-CM | POA: Diagnosis not present

## 2017-02-07 DIAGNOSIS — Z1389 Encounter for screening for other disorder: Secondary | ICD-10-CM | POA: Diagnosis not present

## 2017-02-13 DIAGNOSIS — M170 Bilateral primary osteoarthritis of knee: Secondary | ICD-10-CM | POA: Diagnosis not present

## 2017-02-13 DIAGNOSIS — M25561 Pain in right knee: Secondary | ICD-10-CM | POA: Diagnosis not present

## 2017-02-13 DIAGNOSIS — M25562 Pain in left knee: Secondary | ICD-10-CM | POA: Diagnosis not present

## 2017-03-19 ENCOUNTER — Encounter (HOSPITAL_COMMUNITY): Payer: Self-pay | Admitting: Nurse Practitioner

## 2017-03-19 ENCOUNTER — Ambulatory Visit (HOSPITAL_COMMUNITY)
Admission: RE | Admit: 2017-03-19 | Discharge: 2017-03-19 | Disposition: A | Discharge status: Home / Self Care | Payer: BLUE CROSS/BLUE SHIELD | Source: Ambulatory Visit | Attending: Nurse Practitioner | Admitting: Nurse Practitioner

## 2017-03-19 VITALS — BP 132/82 | HR 57 | Ht 72.0 in | Wt 265.0 lb

## 2017-03-19 DIAGNOSIS — Z91030 Bee allergy status: Secondary | ICD-10-CM | POA: Diagnosis not present

## 2017-03-19 DIAGNOSIS — Z823 Family history of stroke: Secondary | ICD-10-CM | POA: Diagnosis not present

## 2017-03-19 DIAGNOSIS — I481 Persistent atrial fibrillation: Secondary | ICD-10-CM | POA: Insufficient documentation

## 2017-03-19 DIAGNOSIS — G4733 Obstructive sleep apnea (adult) (pediatric): Secondary | ICD-10-CM | POA: Diagnosis not present

## 2017-03-19 DIAGNOSIS — I10 Essential (primary) hypertension: Secondary | ICD-10-CM | POA: Insufficient documentation

## 2017-03-19 DIAGNOSIS — Z7901 Long term (current) use of anticoagulants: Secondary | ICD-10-CM | POA: Insufficient documentation

## 2017-03-19 DIAGNOSIS — E669 Obesity, unspecified: Secondary | ICD-10-CM | POA: Insufficient documentation

## 2017-03-19 DIAGNOSIS — F17210 Nicotine dependence, cigarettes, uncomplicated: Secondary | ICD-10-CM | POA: Diagnosis not present

## 2017-03-19 DIAGNOSIS — I4891 Unspecified atrial fibrillation: Secondary | ICD-10-CM | POA: Diagnosis not present

## 2017-03-19 DIAGNOSIS — K219 Gastro-esophageal reflux disease without esophagitis: Secondary | ICD-10-CM | POA: Insufficient documentation

## 2017-03-19 DIAGNOSIS — Z882 Allergy status to sulfonamides status: Secondary | ICD-10-CM | POA: Diagnosis not present

## 2017-03-19 DIAGNOSIS — Z6835 Body mass index (BMI) 35.0-35.9, adult: Secondary | ICD-10-CM | POA: Insufficient documentation

## 2017-03-19 DIAGNOSIS — Z881 Allergy status to other antibiotic agents status: Secondary | ICD-10-CM | POA: Insufficient documentation

## 2017-03-19 DIAGNOSIS — Z880 Allergy status to penicillin: Secondary | ICD-10-CM | POA: Diagnosis not present

## 2017-03-19 DIAGNOSIS — Z79899 Other long term (current) drug therapy: Secondary | ICD-10-CM | POA: Insufficient documentation

## 2017-03-19 LAB — BASIC METABOLIC PANEL
Anion gap: 7 (ref 5–15)
BUN: 11 mg/dL (ref 6–20)
CALCIUM: 8.7 mg/dL — AB (ref 8.9–10.3)
CHLORIDE: 106 mmol/L (ref 101–111)
CO2: 25 mmol/L (ref 22–32)
CREATININE: 0.85 mg/dL (ref 0.61–1.24)
GFR calc Af Amer: >60 mL/min (ref >60)
GFR calc non Af Amer: >60 mL/min (ref >60)
Glucose, Bld: 100 mg/dL — ABNORMAL HIGH (ref 65–99)
Potassium: 3.9 mmol/L (ref 3.5–5.1)
Sodium: 138 mmol/L (ref 135–145)

## 2017-03-19 LAB — MAGNESIUM: Magnesium: 1.8 mg/dL (ref 1.7–2.4)

## 2017-03-19 NOTE — Progress Notes (Signed)
Patient ID: Sean Morales, male   DOB: May 30, 1960, 56 y.o.   MRN: [id number]     Primary Care Physician: [facility] Referring Physician: [facility] f/u EP: Dr. Merlene Morse is a 56 y.o. male with a h/o persistent afib on tikosyn therapy. He has not had any significant afib.He feels improved. No bleeding issues with xarelto.  F/u in afib clinic, 8/24, he feels well. He is starting to be more active because his knees are not bothering him as much and has lost 30 lbs and hopes to lose more. He and his wife have joined the [place] and he hopes to be more active. Continues on tikosyn and xarelto without any afib or bleeding issues.  F/u in afib clinic 03/19/17. He has not noted any afib.Continues on tikosyn. He has lost around 50- 60 lbs by his scales, weight  this am was 250 lbs before dressing. He is continuing to have knee issues and is expecting to have knee surgery in the next couple of months. He may be interested in an ablation after knee surgery is complete with maintaining SR and reduction  in weight. Will update echo. Continues on xarelto 20 mg daily with a chadsvasc score of 1. Is  using cpap.  Today, he denies symptoms  of shortness of breath, orthopnea, PND, lower extremity edema, dizziness, presyncope, syncope, or neurologic sequela.Positive of symptoms as  listed above.The patient is tolerating medications without difficulties and is otherwise without complaint today.   Past Medical History:  Diagnosis Date  . Depression   . DJD (degenerative joint disease)   . Hypertension   . Obstructive sleep apnea   . Overweight   . Persistent atrial fibrillation ([place])   . Reflux    Past Surgical History:  Procedure Laterality Date  . APPENDECTOMY    . CARDIOVERSION N/A 01/20/2015   Procedure: CARDIOVERSION;  Surgeon: [name], MD;  Location: [facility];  Service: Cardiovascular;  Laterality: N/A;  . knee surgety    . TONSILLECTOMY     age 80     Current Outpatient Medications  Medication Sig Dispense Refill  . acetaminophen (TYLENOL) 500 MG tablet Take 1,000-1,500 mg by mouth every 6 (six) hours as needed for headache.    Marland Kitchen amLODipine (NORVASC) 10 MG tablet Take 1 tablet (10 mg total) by mouth daily.    Marland Kitchen dofetilide (TIKOSYN) 500 MCG capsule Take 1 capsule (500 mcg total) by mouth 2 (two) times daily. 180 capsule 1  . losartan (COZAAR) 100 MG tablet Take 100 mg by mouth daily.     . Magnesium 250 MG TABS Take 1 tablet by mouth daily.    . metoprolol tartrate (LOPRESSOR) 25 MG tablet TAKE 1 TABLET (25 MG TOTAL) BY MOUTH 2 (TWO) TIMES DAILY. 180 tablet 0  . omeprazole (PRILOSEC) 40 MG capsule Take 1 capsule by mouth daily.    . rivaroxaban (XARELTO) 20 MG TABS tablet TAKE 1 TABLET BY MOUTH EVERY DAY WITH DINNER 30 tablet 5  . terbinafine (LAMISIL) 250 MG tablet Take 1 tablet (250 mg total) by mouth daily. 90 tablet 0   No current facility-administered medications for this encounter.     Allergies  Allergen Reactions  . Bee Venom Anaphylaxis    [place] Hornet  . Other     Copyronil per patient  . Penicillins Other (See Comments)    Unknown. Childhood allergy. Has patient had a PCN reaction causing immediate rash, facial/tongue/throat swelling, SOB or lightheadedness  with hypotension: Unknown Has patient had a PCN reaction causing severe rash involving mucus membranes or skin necrosis: Unknown Has patient had a PCN reaction that required hospitalization: Unknown Has patient had a PCN reaction occurring within the last 10 years: No If all of the above answers are "NO", then may proceed with Cephalosporin use.   . Sulfa Antibiotics Other (See Comments)    Unknown. Childhood allergy.  . Tetracyclines & Related Other (See Comments)    Unknown. Childhood allergy.    Social History   Socioeconomic History  . Marital status: Married    Spouse name: [place]  . Number of children: 4  . Years of education: 59  . Highest  education level: Not on file  Social Needs  . Financial resource strain: Not on file  . Food insecurity - worry: Not on file  . Food insecurity - inability: Not on file  . Transportation needs - medical: Not on file  . Transportation needs - non-medical: Not on file  Occupational History    Comment: [employer]  Tobacco Use  . Smoking status: Current Every Day Smoker    Packs/day: 0.50    Years: 25.00    Pack years: 12.50    Types: Cigarettes    Start date: 01/08/1977  . Smokeless tobacco: Never Used  Substance and Sexual Activity  . Alcohol use: No    Alcohol/week: 0.0 oz    Comment: rarely  . Drug use: No  . Sexual activity: Yes  Other Topics Concern  . Not on file  Social History Narrative   Lives in [place] (near [place]) with wife.   Patient 1 cup of coffee daily,occas. 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    Family History  Problem Relation Age of Onset  . Deep vein thrombosis Father   . Stroke Mother     ROS- All systems are reviewed and negative except as per the HPI above  Physical Exam: Vitals:   03/19/17 0904  BP: 132/82  Pulse: (!) 57  Weight: 265 lb (120.2 kg)  Height: 6' (1.829 m)    GEN- The patient is well appearing, alert and oriented x 3 today.   Head- normocephalic, atraumatic Eyes-  Sclera clear, conjunctiva pink Ears- hearing intact Oropharynx- clear Neck- supple, no JVP Lymph- no cervical lymphadenopathy Lungs- Clear to ausculation bilaterally, normal work of breathing Heart- Regular rate and rhythm, no murmurs, rubs or gallops, PMI not laterally displaced GI- soft, NT, ND, + BS Extremities- no clubbing, cyanosis, or edema MS- no significant deformity or atrophy Skin- no rash or lesion Psych- euthymic mood, full affect Neuro- strength and sensation are intact  EKG- Sinus rhythm with first degree block,Pr itn 276 ms, QRS int 84 ms, qtc 449 ms (stable) Event monitor results reviewed  Assessment and  Plan: 1. Persistent afib Doing well on tikosyn, staying in SR  Continue tikosyn 500 mcg bid Continue metoprolol Continue xarelto for chadsvasc score of at least 1, he will be low risk to hold xarelto around time of knee surgery. Bmet/mag today Update echo   2. HTN Stable  3. Obesity Congratulated on weight loss, continue efforts He would like to discuss ablation with Dr. Rayann Heman in 2019 after knee surgery is complete.   F/u in afib clinic in 4 months  Geroge Baseman. Marva Hendryx, [facility] [address] [place], [place] [address] [phone]

## 2017-03-29 ENCOUNTER — Ambulatory Visit (HOSPITAL_COMMUNITY)
Admission: RE | Admit: 2017-03-29 | Discharge: 2017-03-29 | Disposition: A | Discharge status: Home / Self Care | Payer: BLUE CROSS/BLUE SHIELD | Source: Ambulatory Visit | Attending: Nurse Practitioner | Admitting: Nurse Practitioner

## 2017-03-29 DIAGNOSIS — I4891 Unspecified atrial fibrillation: Secondary | ICD-10-CM | POA: Diagnosis not present

## 2017-03-29 DIAGNOSIS — J309 Allergic rhinitis, unspecified: Secondary | ICD-10-CM | POA: Diagnosis not present

## 2017-03-29 DIAGNOSIS — Z720 Tobacco use: Secondary | ICD-10-CM | POA: Insufficient documentation

## 2017-03-29 DIAGNOSIS — M170 Bilateral primary osteoarthritis of knee: Secondary | ICD-10-CM | POA: Diagnosis not present

## 2017-03-29 DIAGNOSIS — I119 Hypertensive heart disease without heart failure: Secondary | ICD-10-CM | POA: Diagnosis not present

## 2017-03-29 HISTORY — PX: TRANSTHORACIC ECHOCARDIOGRAM: SHX275

## 2017-03-29 LAB — BASIC METABOLIC PANEL
Anion gap: 9 (ref 5–15)
BUN: 12 mg/dL (ref 6–20)
CO2: 28 mmol/L (ref 22–32)
CREATININE: 0.96 mg/dL (ref 0.61–1.24)
Calcium: 8.8 mg/dL — ABNORMAL LOW (ref 8.9–10.3)
Chloride: 102 mmol/L (ref 101–111)
GFR calc Af Amer: >60 mL/min (ref >60)
GFR calc non Af Amer: >60 mL/min (ref >60)
Glucose, Bld: 103 mg/dL — ABNORMAL HIGH (ref 65–99)
Potassium: 4 mmol/L (ref 3.5–5.1)
Sodium: 139 mmol/L (ref 135–145)

## 2017-03-29 LAB — MAGNESIUM: Magnesium: 2.1 mg/dL (ref 1.7–2.4)

## 2017-03-29 NOTE — Progress Notes (Signed)
  Echocardiogram 2D Echocardiogram has been performed.  Sean Morales 03/29/2017, 9:50 AM

## 2017-04-07 ENCOUNTER — Other Ambulatory Visit: Payer: Self-pay | Admitting: Cardiovascular Disease

## 2017-04-10 DIAGNOSIS — M179 Osteoarthritis of knee, unspecified: Secondary | ICD-10-CM | POA: Diagnosis not present

## 2017-04-10 DIAGNOSIS — Z1389 Encounter for screening for other disorder: Secondary | ICD-10-CM | POA: Diagnosis not present

## 2017-04-10 DIAGNOSIS — E782 Mixed hyperlipidemia: Secondary | ICD-10-CM | POA: Diagnosis not present

## 2017-04-10 DIAGNOSIS — K219 Gastro-esophageal reflux disease without esophagitis: Secondary | ICD-10-CM | POA: Diagnosis not present

## 2017-04-10 DIAGNOSIS — I10 Essential (primary) hypertension: Secondary | ICD-10-CM | POA: Diagnosis not present

## 2017-04-10 DIAGNOSIS — Z6835 Body mass index (BMI) 35.0-35.9, adult: Secondary | ICD-10-CM | POA: Diagnosis not present

## 2017-04-14 DIAGNOSIS — W228XXA Striking against or struck by other objects, initial encounter: Secondary | ICD-10-CM | POA: Diagnosis not present

## 2017-04-14 DIAGNOSIS — Z881 Allergy status to other antibiotic agents status: Secondary | ICD-10-CM | POA: Diagnosis not present

## 2017-04-14 DIAGNOSIS — Z882 Allergy status to sulfonamides status: Secondary | ICD-10-CM | POA: Diagnosis not present

## 2017-04-14 DIAGNOSIS — K219 Gastro-esophageal reflux disease without esophagitis: Secondary | ICD-10-CM | POA: Diagnosis not present

## 2017-04-14 DIAGNOSIS — S301XXA Contusion of abdominal wall, initial encounter: Secondary | ICD-10-CM | POA: Diagnosis not present

## 2017-04-14 DIAGNOSIS — R1013 Epigastric pain: Secondary | ICD-10-CM | POA: Diagnosis not present

## 2017-04-14 DIAGNOSIS — I4891 Unspecified atrial fibrillation: Secondary | ICD-10-CM | POA: Diagnosis not present

## 2017-04-14 DIAGNOSIS — I10 Essential (primary) hypertension: Secondary | ICD-10-CM | POA: Diagnosis not present

## 2017-04-14 DIAGNOSIS — Z7901 Long term (current) use of anticoagulants: Secondary | ICD-10-CM | POA: Diagnosis not present

## 2017-04-14 DIAGNOSIS — Z880 Allergy status to penicillin: Secondary | ICD-10-CM | POA: Diagnosis not present

## 2017-04-15 ENCOUNTER — Telehealth (HOSPITAL_COMMUNITY): Payer: Self-pay | Admitting: *Deleted

## 2017-04-15 ENCOUNTER — Encounter: Payer: Self-pay | Admitting: Internal Medicine

## 2017-04-15 NOTE — Telephone Encounter (Signed)
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  Pt stated that he is in rhythm.  Per Butch Penny, since pt on tikosyn, in rhythm and has not been cardioverted recently that this would be fine to hold blood thinner per recommendation for 48 hrs.  Pt understood

## 2017-04-18 ENCOUNTER — Encounter (HOSPITAL_COMMUNITY): Payer: Self-pay | Admitting: *Deleted

## 2017-04-19 DIAGNOSIS — E6609 Other obesity due to excess calories: Secondary | ICD-10-CM | POA: Diagnosis not present

## 2017-04-19 DIAGNOSIS — S301XXD Contusion of abdominal wall, subsequent encounter: Secondary | ICD-10-CM | POA: Diagnosis not present

## 2017-04-19 DIAGNOSIS — Z6835 Body mass index (BMI) 35.0-35.9, adult: Secondary | ICD-10-CM | POA: Diagnosis not present

## 2017-04-26 ENCOUNTER — Other Ambulatory Visit: Payer: Self-pay | Admitting: *Deleted

## 2017-04-26 MED ORDER — RIVAROXABAN 20 MG PO TABS: ORAL_TABLET | ORAL | 3 refills | Status: DC

## 2017-05-08 NOTE — H&P (Signed)
TOTAL KNEE ADMISSION H&P  Patient is being admitted for bilateral total knee arthroplasty.  Subjective:  Chief Complaint:     Bilateral knee primary OA / pain  HPI: [name], 57 y.o. male, has a history of pain and functional disability in bilateral knees due to arthritis and has failed non-surgical conservative treatments for greater than 12 weeks to include NSAID's and/or analgesics, corticosteriod injections, viscosupplementation injections, flexibility and strengthening excercises, weight reduction as appropriate and activity modification.  Onset of symptoms was gradual, starting >10 years ago with gradually worsening course since that time. The patient noted prior procedures on the knee to include  ORIF of the right knee.  Patient currently rates pain in the bilateral knees at 9 out of 10 with activity. Patient has night pain, worsening of pain with activity and weight bearing, pain that interferes with activities of daily living, pain with passive range of motion, crepitus and joint swelling.  Patient has evidence of periarticular osteophytes and joint space narrowing by imaging studies.  There is no active infection.Risks, benefits and expectations were discussed with the patient.  Risks including but not limited to the risk of anesthesia, blood clots, nerve damage, blood vessel damage, failure of the prosthesis, infection and up to and including death.  Patient understand the risks, benefits and expectations and wishes to proceed with surgery.   PCP: Sharilyn Sites, MD  D/C Plans:       Home   Post-op Meds:       No Rx given  Tranexamic Acid:      To be given - IV   Decadron:      Is to be given  FYI:     Xarelto (on pre-op a-fib)  Oxycodone  DME:   Rx given for - RW   PT:   [facility]     Patient Active Problem List   Diagnosis Date Noted  . Heart block AV second degree-nocturnal 06/11/2015  . Tobacco use 01/25/2014  . OSA on CPAP 01/25/2014  . Chest pain 01/09/2014  .  Atrial fibrillation ([place]) 01/09/2014  . HTN (hypertension) 01/09/2014  . GERD (gastroesophageal reflux disease) 01/09/2014   Past Medical History:  Diagnosis Date  . Depression   . DJD (degenerative joint disease)   . Hypertension   . Obstructive sleep apnea   . Overweight   . Persistent atrial fibrillation ([place])   . Reflux     Past Surgical History:  Procedure Laterality Date  . APPENDECTOMY    . CARDIOVERSION N/A 01/20/2015   Procedure: CARDIOVERSION;  Surgeon: [name], MD;  Location: [facility];  Service: Cardiovascular;  Laterality: N/A;  . knee surgety    . TONSILLECTOMY     age 23    No current facility-administered medications for this encounter.    Current Outpatient Medications  Medication Sig Dispense Refill Last Dose  . amLODipine (NORVASC) 10 MG tablet Take 1 tablet (10 mg total) by mouth daily. (Patient taking differently: Take 10 mg by mouth every evening. )   Taking  . dofetilide (TIKOSYN) 500 MCG capsule Take 1 capsule (500 mcg total) by mouth 2 (two) times daily. 180 capsule 1 Taking  . losartan (COZAAR) 100 MG tablet Take 100 mg by mouth daily.    Taking  . Magnesium 250 MG TABS Take 250 mg by mouth daily.    Taking  . metoprolol tartrate (LOPRESSOR) 25 MG tablet TAKE 1 TABLET (25 MG TOTAL) BY MOUTH 2 (TWO) TIMES DAILY. 180 tablet 0   .  omeprazole (PRILOSEC) 40 MG capsule Take 40 mg by mouth daily.    Taking  . rivaroxaban (XARELTO) 20 MG TABS tablet TAKE 1 TABLET BY MOUTH EVERY DAY WITH DINNER (Patient taking differently: Take 20 mg by mouth every evening. ) 90 tablet 3   . triamcinolone cream (KENALOG) 0.1 % APPLY TO AFFECTED AREA TWICE A DAY AS NEEDED FOR RASH  0   . terbinafine (LAMISIL) 250 MG tablet Take 1 tablet (250 mg total) by mouth daily. (Patient not taking: Reported on 05/08/2017) 90 tablet 0 Not Taking at Unknown time   Allergies  Allergen Reactions  . Bee Venom Anaphylaxis    [place] Hornet  . Other     Copyronil per patient  .  Penicillins Other (See Comments)    Unknown. Childhood allergy. Has patient had a PCN reaction causing immediate rash, facial/tongue/throat swelling, SOB or lightheadedness with hypotension: Unknown Has patient had a PCN reaction causing severe rash involving mucus membranes or skin necrosis: Unknown Has patient had a PCN reaction that required hospitalization: Unknown Has patient had a PCN reaction occurring within the last 10 years: No If all of the above answers are "NO", then may proceed with Cephalosporin use.   . Sulfa Antibiotics Other (See Comments)    Unknown. Childhood allergy.  . Tetracyclines & Related Other (See Comments)    Unknown. Childhood allergy.    Social History   Tobacco Use  . Smoking status: Current Every Day Smoker    Packs/day: 0.50    Years: 25.00    Pack years: 12.50    Types: Cigarettes    Start date: 01/08/1977  . Smokeless tobacco: Never Used  Substance Use Topics  . Alcohol use: No    Alcohol/week: 0.0 oz    Comment: rarely    Family History  Problem Relation Age of Onset  . Deep vein thrombosis Father   . Stroke Mother      Review of Systems  Constitutional: Negative.   HENT: Negative.   Eyes: Negative.   Respiratory: Negative.   Cardiovascular: Negative.   Gastrointestinal: Positive for heartburn.  Genitourinary: Negative.   Musculoskeletal: Positive for joint pain.  Skin: Negative.   Neurological: Negative.   Endo/Heme/Allergies: Negative.   Psychiatric/Behavioral: Negative.     Objective:  Physical Exam  Constitutional: He is oriented to person, place, and time. He appears well-developed.  HENT:  Head: Normocephalic.  Eyes: Pupils are equal, round, and reactive to light.  Neck: Neck supple. No JVD present. No tracheal deviation present. No thyromegaly present.  Cardiovascular: Normal rate, regular rhythm and intact distal pulses.  Respiratory: Effort normal and breath sounds normal. No respiratory distress. He has no  wheezes.  GI: Soft. There is no tenderness. There is no guarding.  Musculoskeletal:       Right knee: He exhibits decreased range of motion, swelling, abnormal alignment and bony tenderness. He exhibits no ecchymosis, no deformity, no laceration and no erythema. Tenderness found.       Left knee: He exhibits decreased range of motion, swelling, abnormal alignment and bony tenderness. He exhibits no ecchymosis, no deformity, no laceration and no erythema. Tenderness found.  Lymphadenopathy:    He has no cervical adenopathy.  Neurological: He is alert and oriented to person, place, and time.  Skin: Skin is warm and dry.  Psychiatric: He has a normal mood and affect.      Labs:  Estimated body mass index is 35.94 kg/m as calculated from the following:   Height  as of 03/19/17: 6' (1.829 m).   Weight as of 03/19/17: 120.2 kg (265 lb).   Imaging Review Plain radiographs demonstrate severe degenerative joint disease of the bilateral knees. The overall alignment is significant varus. The bone quality appears to be good for age and reported activity level.  Assessment/Plan:  End stage arthritis, bilateral knees  The patient history, physical examination, clinical judgment of the provider and imaging studies are consistent with end stage degenerative joint disease of the bilateral knees and total knee arthroplasty is deemed medically necessary. The treatment options including medical management, injection therapy arthroscopy and arthroplasty were discussed at length. The risks and benefits of total knee arthroplasty were presented and reviewed. The risks due to aseptic loosening, infection, stiffness, patella tracking problems, thromboembolic complications and other imponderables were discussed. The patient acknowledged the explanation, agreed to proceed with the plan and consent was signed. Patient is being admitted for inpatient treatment for surgery, pain control, PT, OT, prophylactic  antibiotics, VTE prophylaxis, progressive ambulation and ADL's and discharge planning. The patient is planning to be discharged home with home health services.    [place] Pugh Zanden Colver   PA-C  05/08/2017, 11:19 AM

## 2017-05-14 ENCOUNTER — Encounter (HOSPITAL_COMMUNITY): Payer: Self-pay

## 2017-05-14 NOTE — Patient Instructions (Addendum)
[facility]  05/14/2017   Your procedure is scheduled on: 05-20-17  Report to [facility] Main  Entrance  Report to admitting at AM   7:30 AM   Call this number if you have problems the morning of surgery [phone]   Remember: Do not eat food or drink liquids :After Midnight.               Bring cpap mask and tubing.   Take these medicines the morning of surgery with A SIP OF WATER:  Tikosyn, Metoprolol, Prilosec                               You may not have any metal on your body including hair pins and              piercings  Do not wear jewelry, make-up, lotions, powders or perfumes, deodorant             Do not wear nail polish.  Do not shave  48 hours prior to surgery.              Men may shave face and neck.   Do not bring valuables to the hospital. [place].  Contacts, dentures or bridgework may not be worn into surgery.  Leave suitcase in the car. After surgery it may be brought to your room.                  Please read over the following fact sheets you were given: _____________________________________________________________________             [facility] - Preparing for Surgery Before surgery, you can play an important role.  Because skin is not sterile, your skin needs to be as free of germs as possible.  You can reduce the number of germs on your skin by washing with CHG (chlorahexidine gluconate) soap before surgery.  CHG is an antiseptic cleaner which kills germs and bonds with the skin to continue killing germs even after washing. Please DO NOT use if you have an allergy to CHG or antibacterial soaps.  If your skin becomes reddened/irritated stop using the CHG and inform your nurse when you arrive at Short Stay. Do not shave (including legs and underarms) for at least 48 hours prior to the first CHG shower.  You may shave your face/neck. Please follow these instructions  carefully:  1.  Shower with CHG Soap the night before surgery and the  morning of Surgery.  2.  If you choose to wash your hair, wash your hair first as usual with your  normal  shampoo.  3.  After you shampoo, rinse your hair and body thoroughly to remove the  shampoo.                           4.  Use CHG as you would any other liquid soap.  You can apply chg directly  to the skin and wash                       Gently with a scrungie or clean washcloth.  5.  Apply the CHG Soap to your body ONLY  FROM THE NECK DOWN.   Do not use on face/ open                           Wound or open sores. Avoid contact with eyes, ears mouth and genitals (private parts).                       Wash face,  Genitals (private parts) with your normal soap.             6.  Wash thoroughly, paying special attention to the area where your surgery  will be performed.  7.  Thoroughly rinse your body with warm water from the neck down.  8.  DO NOT shower/wash with your normal soap after using and rinsing off  the CHG Soap.                9.  Pat yourself dry with a clean towel.            10.  Wear clean pajamas.            11.  Place clean sheets on your bed the night of your first shower and do not  sleep with pets. Day of Surgery : Do not apply any lotions/deodorants the morning of surgery.  Please wear clean clothes to the hospital/surgery center.  FAILURE TO FOLLOW THESE INSTRUCTIONS MAY RESULT IN THE CANCELLATION OF YOUR SURGERY PATIENT SIGNATURE_________________________________  NURSE SIGNATURE__________________________________  ________________________________________________________________________   Adam Phenix  An incentive spirometer is a tool that can help keep your lungs clear and active. This tool measures how well you are filling your lungs with each breath. Taking long deep breaths may help reverse or decrease the chance of developing breathing (pulmonary) problems (especially infection)  following:  A long period of time when you are unable to move or be active. BEFORE THE PROCEDURE   If the spirometer includes an indicator to show your best effort, your nurse or respiratory therapist will set it to a desired goal.  If possible, sit up straight or lean slightly forward. Try not to slouch.  Hold the incentive spirometer in an upright position. INSTRUCTIONS FOR USE  1. Sit on the edge of your bed if possible, or sit up as far as you can in bed or on a chair. 2. Hold the incentive spirometer in an upright position. 3. Breathe out normally. 4. Place the mouthpiece in your mouth and seal your lips tightly around it. 5. Breathe in slowly and as deeply as possible, raising the piston or the ball toward the top of the column. 6. Hold your breath for 3-5 seconds or for as long as possible. Allow the piston or ball to fall to the bottom of the column. 7. Remove the mouthpiece from your mouth and breathe out normally. 8. Rest for a few seconds and repeat Steps 1 through 7 at least 10 times every 1-2 hours when you are awake. Take your time and take a few normal breaths between deep breaths. 9. The spirometer may include an indicator to show your best effort. Use the indicator as a goal to work toward during each repetition. 10. After each set of 10 deep breaths, practice coughing to be sure your lungs are clear. If you have an incision (the cut made at the time of surgery), support your incision when coughing by placing a pillow or rolled up towels firmly against it. Once you  are able to get out of bed, walk around indoors and cough well. You may stop using the incentive spirometer when instructed by your caregiver.  RISKS AND COMPLICATIONS  Take your time so you do not get dizzy or light-headed.  If you are in pain, you may need to take or ask for pain medication before doing incentive spirometry. It is harder to take a deep breath if you are having pain. AFTER USE  Rest and  breathe slowly and easily.  It can be helpful to keep track of a log of your progress. Your caregiver can provide you with a simple table to help with this. If you are using the spirometer at home, follow these instructions: [place] IF:   You are having difficultly using the spirometer.  You have trouble using the spirometer as often as instructed.  Your pain medication is not giving enough relief while using the spirometer.  You develop fever of 100.5 F (38.1 C) or higher. SEEK IMMEDIATE MEDICAL CARE IF:   You cough up bloody sputum that had not been present before.  You develop fever of 102 F (38.9 C) or greater.  You develop worsening pain at or near the incision site. MAKE SURE YOU:   Understand these instructions.  Will watch your condition.  Will get help right away if you are not doing well or get worse. Document Released: 07/30/2006 Document Revised: 06/11/2011 Document Reviewed: 09/30/2006 ExitCare Patient Information 2014 ExitCare, [place].   ________________________________________________________________________  WHAT IS A BLOOD TRANSFUSION? Blood Transfusion Information  A transfusion is the replacement of blood or some of its parts. Blood is made up of multiple cells which provide different functions.  Red blood cells carry oxygen and are used for blood loss replacement.  White blood cells fight against infection.  Platelets control bleeding.  Plasma helps clot blood.  Other blood products are available for specialized needs, such as hemophilia or other clotting disorders. BEFORE THE TRANSFUSION  Who gives blood for transfusions?   Healthy volunteers who are fully evaluated to make sure their blood is safe. This is blood bank blood. Transfusion therapy is the safest it has ever been in the practice of medicine. Before blood is taken from a donor, a complete history is taken to make sure that person has no history of diseases nor engages in  risky social behavior (examples are intravenous drug use or sexual activity with multiple partners). The donor's travel history is screened to minimize risk of transmitting infections, such as malaria. The donated blood is tested for signs of infectious diseases, such as HIV and hepatitis. The blood is then tested to be sure it is compatible with you in order to minimize the chance of a transfusion reaction. If you or a relative donates blood, this is often done in anticipation of surgery and is not appropriate for emergency situations. It takes many days to process the donated blood. RISKS AND COMPLICATIONS Although transfusion therapy is very safe and saves many lives, the main dangers of transfusion include:   Getting an infectious disease.  Developing a transfusion reaction. This is an allergic reaction to something in the blood you were given. Every precaution is taken to prevent this. The decision to have a blood transfusion has been considered carefully by your caregiver before blood is given. Blood is not given unless the benefits outweigh the risks. AFTER THE TRANSFUSION  Right after receiving a blood transfusion, you will usually feel much better and more energetic. This is  especially true if your red blood cells have gotten low (anemic). The transfusion raises the level of the red blood cells which carry oxygen, and this usually causes an energy increase.  The nurse administering the transfusion will monitor you carefully for complications. HOME CARE INSTRUCTIONS  No special instructions are needed after a transfusion. You may find your energy is better. Speak with your caregiver about any limitations on activity for underlying diseases you may have. SEEK MEDICAL CARE IF:   Your condition is not improving after your transfusion.  You develop redness or irritation at the intravenous (IV) site. SEEK IMMEDIATE MEDICAL CARE IF:  Any of the following symptoms occur over the next 12  hours:  Shaking chills.  You have a temperature by mouth above 102 F (38.9 C), not controlled by medicine.  Chest, back, or muscle pain.  People around you feel you are not acting correctly or are confused.  Shortness of breath or difficulty breathing.  Dizziness and fainting.  You get a rash or develop hives.  You have a decrease in urine output.  Your urine turns a dark color or changes to pink, red, or brown. Any of the following symptoms occur over the next 10 days:  You have a temperature by mouth above 102 F (38.9 C), not controlled by medicine.  Shortness of breath.  Weakness after normal activity.  The white part of the eye turns yellow (jaundice).  You have a decrease in the amount of urine or are urinating less often.  Your urine turns a dark color or changes to pink, red, or brown. Document Released: 03/16/2000 Document Revised: 06/11/2011 Document Reviewed: 11/03/2007 [facility] Patient Information 2014 [place], [place].  _______________________________________________________________________

## 2017-05-15 ENCOUNTER — Other Ambulatory Visit: Payer: Self-pay

## 2017-05-15 ENCOUNTER — Encounter (HOSPITAL_COMMUNITY)
Admission: RE | Admit: 2017-05-15 | Discharge: 2017-05-15 | Disposition: A | Discharge status: Home / Self Care | Payer: BLUE CROSS/BLUE SHIELD | Source: Ambulatory Visit | Attending: Orthopedic Surgery | Admitting: Orthopedic Surgery

## 2017-05-15 ENCOUNTER — Other Ambulatory Visit (HOSPITAL_COMMUNITY): Payer: Self-pay | Admitting: *Deleted

## 2017-05-15 ENCOUNTER — Encounter (HOSPITAL_COMMUNITY): Payer: Self-pay

## 2017-05-15 DIAGNOSIS — Z01812 Encounter for preprocedural laboratory examination: Secondary | ICD-10-CM | POA: Diagnosis not present

## 2017-05-15 HISTORY — DX: Pain in right knee: M25.561

## 2017-05-15 HISTORY — DX: Osteoarthritis of knee, unspecified: M17.9

## 2017-05-15 HISTORY — DX: Pain in right knee: M25.562

## 2017-05-15 HISTORY — DX: Unilateral primary osteoarthritis, unspecified knee: M17.10

## 2017-05-15 HISTORY — DX: Gastro-esophageal reflux disease without esophagitis: K21.9

## 2017-05-15 HISTORY — DX: Atrioventricular block, first degree: I44.0

## 2017-05-15 LAB — SURGICAL PCR SCREEN
MRSA, PCR: NEGATIVE
STAPHYLOCOCCUS AUREUS: NEGATIVE

## 2017-05-15 LAB — CBC
HCT: 45.8 % (ref 39.0–52.0)
Hemoglobin: 15.1 g/dL (ref 13.0–17.0)
MCH: 30.2 pg (ref 26.0–34.0)
MCHC: 33 g/dL (ref 30.0–36.0)
MCV: 91.6 fL (ref 78.0–100.0)
Platelets: 229 10*3/uL (ref 150–400)
RBC: 5 MIL/uL (ref 4.22–5.81)
RDW: 14 % (ref 11.5–15.5)
WBC: 9.5 10*3/uL (ref 4.0–10.5)

## 2017-05-15 LAB — BASIC METABOLIC PANEL
ANION GAP: 10 (ref 5–15)
BUN: 16 mg/dL (ref 6–20)
CO2: 25 mmol/L (ref 22–32)
Calcium: 8.9 mg/dL (ref 8.9–10.3)
Chloride: 106 mmol/L (ref 101–111)
Creatinine, Ser: 0.94 mg/dL (ref 0.61–1.24)
Glucose, Bld: 91 mg/dL (ref 65–99)
POTASSIUM: 3.8 mmol/L (ref 3.5–5.1)
SODIUM: 141 mmol/L (ref 135–145)

## 2017-05-15 NOTE — Progress Notes (Signed)
Sean Pulse np cardiology 03-19-17 epic  ekg 03-19-17 epic Echo 03-19-17 epic

## 2017-05-15 NOTE — Progress Notes (Signed)
Spoke with dr Gifford Shave mda and made aware patient medical history and patient had cardiology clearance from dr allred anesthesia will see patient day of surgery per dr Gifford Shave Erskine Squibb

## 2017-05-16 LAB — ABO/RH: ABO/RH(D): A POS

## 2017-05-17 NOTE — Progress Notes (Signed)
Patient called back and was concerned over timing of Tikosyn dose on Monday 05/20/17 am.  Patient states he takes Tikosyn for atrial fib and the atrial fib clinic had him backing up the doses for surgery on 2/18/2019am  But since his surgery is now at 0730am on 05/21/2017, patient questioned what to do with Tikosyn since he was on an 0800am-2000pm schedule.  Nurse informed patient that I would go and speak with Anesthesiologist, Dr Milton Ferguson and receive instruction from him regarding Tikosyn dose for Monday am since arrival time is 0530am with surgery time of 0730am.  Spoke with Dr Milton Ferguson ( anesthesia) and per Dr Milton Ferguson he stated for patient to take am dose of Tikosyn at 0500am with sip of water on 05/20/17 am.  Called patient and instructed patient to take Tikosyn dose for Monday, 05/20/17 am at 0500am with sip of water.

## 2017-05-17 NOTE — Progress Notes (Signed)
Spoke with paitent and made aware of time change and to arrive at0530am .  Patient voiced understanding.  Patient also given directions to Short Stay.

## 2017-05-19 MED ORDER — TRANEXAMIC ACID 1000 MG/10ML IV SOLN
1000.0000 mg | INTRAVENOUS | Status: AC
  Administered 2017-05-20: 1000 mg via INTRAVENOUS
  Filled 2017-05-19: qty 1100

## 2017-05-20 ENCOUNTER — Inpatient Hospital Stay (HOSPITAL_COMMUNITY): Payer: BLUE CROSS/BLUE SHIELD | Admitting: Anesthesiology

## 2017-05-20 ENCOUNTER — Encounter (HOSPITAL_COMMUNITY): Payer: Self-pay | Admitting: Anesthesiology

## 2017-05-20 ENCOUNTER — Inpatient Hospital Stay (HOSPITAL_COMMUNITY)
Admission: RE | Admit: 2017-05-20 | Discharge: 2017-05-23 | DRG: 462 | Disposition: A | Discharge status: Home Health | Payer: BLUE CROSS/BLUE SHIELD | Source: Ambulatory Visit | Attending: Orthopedic Surgery | Admitting: Orthopedic Surgery

## 2017-05-20 ENCOUNTER — Other Ambulatory Visit: Payer: Self-pay

## 2017-05-20 ENCOUNTER — Encounter (HOSPITAL_COMMUNITY)
Admission: RE | Disposition: A | Discharge status: Home Health | Payer: Self-pay | Source: Ambulatory Visit | Attending: Orthopedic Surgery

## 2017-05-20 DIAGNOSIS — Z91030 Bee allergy status: Secondary | ICD-10-CM

## 2017-05-20 DIAGNOSIS — I441 Atrioventricular block, second degree: Secondary | ICD-10-CM | POA: Diagnosis present

## 2017-05-20 DIAGNOSIS — K219 Gastro-esophageal reflux disease without esophagitis: Secondary | ICD-10-CM | POA: Diagnosis not present

## 2017-05-20 DIAGNOSIS — Z6834 Body mass index (BMI) 34.0-34.9, adult: Secondary | ICD-10-CM | POA: Diagnosis not present

## 2017-05-20 DIAGNOSIS — I10 Essential (primary) hypertension: Secondary | ICD-10-CM | POA: Diagnosis present

## 2017-05-20 DIAGNOSIS — Z9089 Acquired absence of other organs: Secondary | ICD-10-CM

## 2017-05-20 DIAGNOSIS — E669 Obesity, unspecified: Secondary | ICD-10-CM | POA: Diagnosis present

## 2017-05-20 DIAGNOSIS — M659 Synovitis and tenosynovitis, unspecified: Secondary | ICD-10-CM | POA: Diagnosis present

## 2017-05-20 DIAGNOSIS — Z823 Family history of stroke: Secondary | ICD-10-CM | POA: Diagnosis not present

## 2017-05-20 DIAGNOSIS — Z882 Allergy status to sulfonamides status: Secondary | ICD-10-CM | POA: Diagnosis not present

## 2017-05-20 DIAGNOSIS — I4891 Unspecified atrial fibrillation: Secondary | ICD-10-CM | POA: Diagnosis not present

## 2017-05-20 DIAGNOSIS — Z881 Allergy status to other antibiotic agents status: Secondary | ICD-10-CM

## 2017-05-20 DIAGNOSIS — M170 Bilateral primary osteoarthritis of knee: Secondary | ICD-10-CM | POA: Diagnosis not present

## 2017-05-20 DIAGNOSIS — Z880 Allergy status to penicillin: Secondary | ICD-10-CM | POA: Diagnosis not present

## 2017-05-20 DIAGNOSIS — Z9049 Acquired absence of other specified parts of digestive tract: Secondary | ICD-10-CM

## 2017-05-20 DIAGNOSIS — F17200 Nicotine dependence, unspecified, uncomplicated: Secondary | ICD-10-CM | POA: Diagnosis present

## 2017-05-20 DIAGNOSIS — I481 Persistent atrial fibrillation: Secondary | ICD-10-CM | POA: Diagnosis present

## 2017-05-20 DIAGNOSIS — Z96659 Presence of unspecified artificial knee joint: Secondary | ICD-10-CM | POA: Diagnosis not present

## 2017-05-20 DIAGNOSIS — G4733 Obstructive sleep apnea (adult) (pediatric): Secondary | ICD-10-CM | POA: Diagnosis not present

## 2017-05-20 DIAGNOSIS — G8918 Other acute postprocedural pain: Secondary | ICD-10-CM | POA: Diagnosis not present

## 2017-05-20 DIAGNOSIS — Z888 Allergy status to other drugs, medicaments and biological substances status: Secondary | ICD-10-CM

## 2017-05-20 HISTORY — PX: TOTAL KNEE ARTHROPLASTY: SHX125

## 2017-05-20 LAB — TYPE AND SCREEN
ABO/RH(D): A POS
ANTIBODY SCREEN: NEGATIVE

## 2017-05-20 SURGERY — ARTHROPLASTY, KNEE, BILATERAL, TOTAL
Anesthesia: General | Laterality: Bilateral

## 2017-05-20 MED ORDER — ONDANSETRON HCL 4 MG/2ML IJ SOLN
INTRAMUSCULAR | Status: AC
  Filled 2017-05-20: qty 8

## 2017-05-20 MED ORDER — PROMETHAZINE HCL 25 MG/ML IJ SOLN: 6.2500 mg | INTRAMUSCULAR | Status: DC | PRN

## 2017-05-20 MED ORDER — DEXAMETHASONE SODIUM PHOSPHATE 10 MG/ML IJ SOLN
INTRAMUSCULAR | Status: AC
  Filled 2017-05-20: qty 4

## 2017-05-20 MED ORDER — KETOROLAC TROMETHAMINE 30 MG/ML IJ SOLN
INTRAMUSCULAR | Status: AC
  Filled 2017-05-20: qty 1

## 2017-05-20 MED ORDER — METOPROLOL TARTRATE 25 MG PO TABS
25.0000 mg | ORAL_TABLET | Freq: Two times a day (BID) | ORAL | Status: DC
  Administered 2017-05-20 – 2017-05-23 (×6): 25 mg via ORAL
  Filled 2017-05-20 (×6): qty 1

## 2017-05-20 MED ORDER — OXYCODONE HCL 5 MG PO TABS
ORAL_TABLET | ORAL | Status: AC
  Filled 2017-05-20: qty 2

## 2017-05-20 MED ORDER — METOCLOPRAMIDE HCL 5 MG/ML IJ SOLN: 5.0000 mg | Freq: Three times a day (TID) | INTRAMUSCULAR | Status: DC | PRN

## 2017-05-20 MED ORDER — HYDROMORPHONE HCL 1 MG/ML IJ SOLN
INTRAMUSCULAR | Status: DC | PRN
  Administered 2017-05-20 (×4): 0.5 mg via INTRAVENOUS

## 2017-05-20 MED ORDER — BUPIVACAINE-EPINEPHRINE (PF) 0.25% -1:200000 IJ SOLN
INTRAMUSCULAR | Status: DC | PRN
  Administered 2017-05-20 (×2): 10 mL

## 2017-05-20 MED ORDER — ALUM & MAG HYDROXIDE-SIMETH 200-200-20 MG/5ML PO SUSP: 15.0000 mL | ORAL | Status: DC | PRN

## 2017-05-20 MED ORDER — DOCUSATE SODIUM 100 MG PO CAPS: 100.0000 mg | ORAL_CAPSULE | Freq: Two times a day (BID) | ORAL | 0 refills | Status: DC

## 2017-05-20 MED ORDER — BISACODYL 10 MG RE SUPP: 10.0000 mg | Freq: Every day | RECTAL | Status: DC | PRN

## 2017-05-20 MED ORDER — METHOCARBAMOL 1000 MG/10ML IJ SOLN
500.0000 mg | Freq: Four times a day (QID) | INTRAVENOUS | Status: DC | PRN
  Administered 2017-05-20: 500 mg via INTRAVENOUS
  Filled 2017-05-20: qty 550

## 2017-05-20 MED ORDER — MEPERIDINE HCL 50 MG/ML IJ SOLN: 6.2500 mg | INTRAMUSCULAR | Status: DC | PRN

## 2017-05-20 MED ORDER — PROPOFOL 10 MG/ML IV BOLUS
INTRAVENOUS | Status: DC | PRN
  Administered 2017-05-20: 300 mg via INTRAVENOUS

## 2017-05-20 MED ORDER — SODIUM CHLORIDE 0.9 % IV SOLN
INTRAVENOUS | Status: DC
  Administered 2017-05-20 – 2017-05-21 (×3): via INTRAVENOUS

## 2017-05-20 MED ORDER — LACTATED RINGERS IV SOLN
INTRAVENOUS | Status: DC
  Administered 2017-05-20: 12:00:00 via INTRAVENOUS

## 2017-05-20 MED ORDER — CEFAZOLIN SODIUM-DEXTROSE 2-4 GM/100ML-% IV SOLN
2.0000 g | Freq: Four times a day (QID) | INTRAVENOUS | Status: AC
  Administered 2017-05-20 (×2): 2 g via INTRAVENOUS
  Filled 2017-05-20: qty 100

## 2017-05-20 MED ORDER — KETOROLAC TROMETHAMINE 30 MG/ML IJ SOLN
INTRAMUSCULAR | Status: DC | PRN
  Administered 2017-05-20 (×2): 15 mg

## 2017-05-20 MED ORDER — CEFAZOLIN SODIUM-DEXTROSE 2-4 GM/100ML-% IV SOLN
INTRAVENOUS | Status: AC
  Administered 2017-05-20: 2 g via INTRAVENOUS
  Filled 2017-05-20: qty 100

## 2017-05-20 MED ORDER — FERROUS SULFATE 325 (65 FE) MG PO TABS
325.0000 mg | ORAL_TABLET | Freq: Three times a day (TID) | ORAL | Status: DC
  Administered 2017-05-21 – 2017-05-23 (×6): 325 mg via ORAL
  Filled 2017-05-20 (×7): qty 1

## 2017-05-20 MED ORDER — SODIUM CHLORIDE 0.9 % IJ SOLN
INTRAMUSCULAR | Status: AC
  Filled 2017-05-20: qty 20

## 2017-05-20 MED ORDER — OMEPRAZOLE 20 MG PO CPDR
40.0000 mg | DELAYED_RELEASE_CAPSULE | Freq: Every day | ORAL | Status: DC
  Administered 2017-05-21 – 2017-05-23 (×3): 40 mg via ORAL
  Filled 2017-05-20 (×3): qty 2

## 2017-05-20 MED ORDER — OXYCODONE HCL 5 MG PO TABS: 5.0000 mg | ORAL_TABLET | ORAL | 0 refills | Status: DC | PRN

## 2017-05-20 MED ORDER — OXYCODONE HCL 5 MG PO TABS: 5.0000 mg | ORAL_TABLET | ORAL | Status: DC | PRN

## 2017-05-20 MED ORDER — METHOCARBAMOL 500 MG PO TABS: 500.0000 mg | ORAL_TABLET | Freq: Four times a day (QID) | ORAL | 0 refills | Status: DC | PRN

## 2017-05-20 MED ORDER — DOFETILIDE 250 MCG PO CAPS
500.0000 ug | ORAL_CAPSULE | Freq: Two times a day (BID) | ORAL | Status: DC
  Administered 2017-05-20 – 2017-05-23 (×6): 500 ug via ORAL
  Filled 2017-05-20 (×6): qty 2

## 2017-05-20 MED ORDER — LACTATED RINGERS IV SOLN
INTRAVENOUS | Status: DC | PRN
  Administered 2017-05-20 (×2): via INTRAVENOUS

## 2017-05-20 MED ORDER — HYDROMORPHONE HCL 1 MG/ML IJ SOLN
0.2500 mg | INTRAMUSCULAR | Status: DC | PRN
  Administered 2017-05-20 (×2): 0.25 mg via INTRAVENOUS

## 2017-05-20 MED ORDER — PHENOL 1.4 % MT LIQD: 1.0000 | OROMUCOSAL | Status: DC | PRN

## 2017-05-20 MED ORDER — SODIUM CHLORIDE 0.9 % IR SOLN
Status: DC | PRN
  Administered 2017-05-20: 2000 mL

## 2017-05-20 MED ORDER — HYDROMORPHONE HCL 2 MG/ML IJ SOLN
INTRAMUSCULAR | Status: AC
  Filled 2017-05-20: qty 1

## 2017-05-20 MED ORDER — MAGNESIUM CITRATE PO SOLN: 1.0000 | Freq: Once | ORAL | Status: DC | PRN

## 2017-05-20 MED ORDER — SODIUM CHLORIDE 0.9 % IR SOLN
Status: DC | PRN
  Administered 2017-05-20: 1000 mL

## 2017-05-20 MED ORDER — DEXAMETHASONE SODIUM PHOSPHATE 10 MG/ML IJ SOLN
10.0000 mg | Freq: Once | INTRAMUSCULAR | Status: AC
  Administered 2017-05-20: 10 mg via INTRAVENOUS

## 2017-05-20 MED ORDER — SODIUM CHLORIDE 0.9 % IJ SOLN
INTRAMUSCULAR | Status: DC | PRN
  Administered 2017-05-20 (×2): 15 mL

## 2017-05-20 MED ORDER — AMLODIPINE BESYLATE 10 MG PO TABS
10.0000 mg | ORAL_TABLET | Freq: Every evening | ORAL | Status: DC
  Administered 2017-05-20 – 2017-05-22 (×3): 10 mg via ORAL
  Filled 2017-05-20 (×3): qty 1

## 2017-05-20 MED ORDER — STERILE WATER FOR IRRIGATION IR SOLN
Status: DC | PRN
  Administered 2017-05-20: 2000 mL

## 2017-05-20 MED ORDER — OXYCODONE HCL 5 MG PO TABS
10.0000 mg | ORAL_TABLET | ORAL | Status: DC | PRN
  Administered 2017-05-20 – 2017-05-22 (×12): 10 mg via ORAL
  Filled 2017-05-20 (×11): qty 2

## 2017-05-20 MED ORDER — GLYCOPYRROLATE 0.2 MG/ML IV SOSY
PREFILLED_SYRINGE | INTRAVENOUS | Status: DC | PRN
  Administered 2017-05-20: .3 mg via INTRAVENOUS

## 2017-05-20 MED ORDER — ROPIVACAINE HCL 7.5 MG/ML IJ SOLN
INTRAMUSCULAR | Status: DC | PRN
  Administered 2017-05-20 (×2): 15 mL via PERINEURAL

## 2017-05-20 MED ORDER — METOCLOPRAMIDE HCL 5 MG PO TABS: 5.0000 mg | ORAL_TABLET | Freq: Three times a day (TID) | ORAL | Status: DC | PRN

## 2017-05-20 MED ORDER — DOCUSATE SODIUM 100 MG PO CAPS
100.0000 mg | ORAL_CAPSULE | Freq: Two times a day (BID) | ORAL | Status: DC
  Administered 2017-05-20 – 2017-05-23 (×6): 100 mg via ORAL
  Filled 2017-05-20 (×7): qty 1

## 2017-05-20 MED ORDER — HYDROMORPHONE HCL 1 MG/ML IJ SOLN
INTRAMUSCULAR | Status: AC
  Administered 2017-05-20: 0.25 mg via INTRAVENOUS
  Filled 2017-05-20: qty 2

## 2017-05-20 MED ORDER — POLYETHYLENE GLYCOL 3350 17 G PO PACK: 17.0000 g | PACK | Freq: Two times a day (BID) | ORAL | 0 refills | Status: DC

## 2017-05-20 MED ORDER — SODIUM CHLORIDE 0.9 % IJ SOLN
INTRAMUSCULAR | Status: AC
  Filled 2017-05-20: qty 50

## 2017-05-20 MED ORDER — POLYETHYLENE GLYCOL 3350 17 G PO PACK
17.0000 g | PACK | Freq: Two times a day (BID) | ORAL | Status: DC
  Administered 2017-05-21 – 2017-05-22 (×3): 17 g via ORAL
  Filled 2017-05-20 (×3): qty 1

## 2017-05-20 MED ORDER — PROPOFOL 10 MG/ML IV BOLUS
INTRAVENOUS | Status: AC
  Filled 2017-05-20: qty 60

## 2017-05-20 MED ORDER — ACETAMINOPHEN 500 MG PO TABS
1000.0000 mg | ORAL_TABLET | Freq: Three times a day (TID) | ORAL | Status: DC
Start: 2017-05-20 — End: 2017-05-23
  Administered 2017-05-20 – 2017-05-23 (×10): 1000 mg via ORAL
  Filled 2017-05-20 (×10): qty 2

## 2017-05-20 MED ORDER — HYDROMORPHONE HCL 1 MG/ML IJ SOLN
0.5000 mg | INTRAMUSCULAR | Status: DC | PRN
  Administered 2017-05-21: 1 mg via INTRAVENOUS
  Administered 2017-05-21 (×2): 0.5 mg via INTRAVENOUS
  Administered 2017-05-22: 03:00:00 1 mg via INTRAVENOUS
  Filled 2017-05-20 (×4): qty 1

## 2017-05-20 MED ORDER — ONDANSETRON HCL 4 MG/2ML IJ SOLN: 4.0000 mg | Freq: Three times a day (TID) | INTRAMUSCULAR | Status: DC | PRN

## 2017-05-20 MED ORDER — METHOCARBAMOL 500 MG PO TABS
500.0000 mg | ORAL_TABLET | Freq: Four times a day (QID) | ORAL | Status: DC | PRN
  Administered 2017-05-21 – 2017-05-23 (×7): 500 mg via ORAL
  Filled 2017-05-20 (×7): qty 1

## 2017-05-20 MED ORDER — LOSARTAN POTASSIUM 50 MG PO TABS
100.0000 mg | ORAL_TABLET | Freq: Every day | ORAL | Status: DC
  Administered 2017-05-21 – 2017-05-23 (×3): 100 mg via ORAL
  Filled 2017-05-20 (×3): qty 2

## 2017-05-20 MED ORDER — CHLORHEXIDINE GLUCONATE 4 % EX LIQD: 60.0000 mL | Freq: Once | CUTANEOUS | Status: DC

## 2017-05-20 MED ORDER — MIDAZOLAM HCL 5 MG/5ML IJ SOLN
INTRAMUSCULAR | Status: DC | PRN
  Administered 2017-05-20: 2 mg via INTRAVENOUS

## 2017-05-20 MED ORDER — FENTANYL CITRATE (PF) 100 MCG/2ML IJ SOLN
INTRAMUSCULAR | Status: DC | PRN
  Administered 2017-05-20 (×4): 25 ug via INTRAVENOUS
  Administered 2017-05-20: 75 ug via INTRAVENOUS
  Administered 2017-05-20: 25 ug via INTRAVENOUS

## 2017-05-20 MED ORDER — FENTANYL CITRATE (PF) 100 MCG/2ML IJ SOLN
INTRAMUSCULAR | Status: AC
  Filled 2017-05-20: qty 2

## 2017-05-20 MED ORDER — GLYCOPYRROLATE 0.2 MG/ML IV SOSY
PREFILLED_SYRINGE | INTRAVENOUS | Status: AC
  Filled 2017-05-20: qty 3

## 2017-05-20 MED ORDER — TRANEXAMIC ACID 1000 MG/10ML IV SOLN
1000.0000 mg | Freq: Once | INTRAVENOUS | Status: AC
  Administered 2017-05-20: 1000 mg via INTRAVENOUS
  Filled 2017-05-20: qty 1100

## 2017-05-20 MED ORDER — ONDANSETRON HCL 4 MG/2ML IJ SOLN
INTRAMUSCULAR | Status: DC | PRN
  Administered 2017-05-20: 4 mg via INTRAVENOUS

## 2017-05-20 MED ORDER — LIDOCAINE 2% (20 MG/ML) 5 ML SYRINGE
INTRAMUSCULAR | Status: AC
  Filled 2017-05-20: qty 5

## 2017-05-20 MED ORDER — LIDOCAINE 2% (20 MG/ML) 5 ML SYRINGE
INTRAMUSCULAR | Status: DC | PRN
  Administered 2017-05-20: 50 mg via INTRAVENOUS

## 2017-05-20 MED ORDER — BUPIVACAINE-EPINEPHRINE 0.25% -1:200000 IJ SOLN
INTRAMUSCULAR | Status: AC
  Filled 2017-05-20: qty 1

## 2017-05-20 MED ORDER — MENTHOL 3 MG MT LOZG: 1.0000 | LOZENGE | OROMUCOSAL | Status: DC | PRN

## 2017-05-20 MED ORDER — CEFAZOLIN SODIUM-DEXTROSE 2-4 GM/100ML-% IV SOLN
2.0000 g | INTRAVENOUS | Status: AC
  Administered 2017-05-20: 2 g via INTRAVENOUS
  Filled 2017-05-20: qty 100

## 2017-05-20 MED ORDER — FERROUS SULFATE 325 (65 FE) MG PO TABS: 325.0000 mg | ORAL_TABLET | Freq: Three times a day (TID) | ORAL | 3 refills | Status: DC

## 2017-05-20 MED ORDER — DIPHENHYDRAMINE HCL 12.5 MG/5ML PO ELIX
12.5000 mg | ORAL_SOLUTION | ORAL | Status: DC | PRN
  Administered 2017-05-22: 25 mg via ORAL
  Filled 2017-05-20: qty 10

## 2017-05-20 MED ORDER — DEXAMETHASONE SODIUM PHOSPHATE 10 MG/ML IJ SOLN
10.0000 mg | Freq: Once | INTRAMUSCULAR | Status: AC
  Administered 2017-05-21: 10 mg via INTRAVENOUS
  Filled 2017-05-20: qty 1

## 2017-05-20 MED ORDER — ONDANSETRON HCL 4 MG PO TABS: 4.0000 mg | ORAL_TABLET | Freq: Three times a day (TID) | ORAL | Status: DC | PRN

## 2017-05-20 MED ORDER — RIVAROXABAN 10 MG PO TABS
10.0000 mg | ORAL_TABLET | ORAL | Status: DC
  Administered 2017-05-21 – 2017-05-23 (×3): 10 mg via ORAL
  Filled 2017-05-20 (×3): qty 1

## 2017-05-20 MED ORDER — MIDAZOLAM HCL 2 MG/2ML IJ SOLN
INTRAMUSCULAR | Status: AC
  Filled 2017-05-20: qty 2

## 2017-05-20 SURGICAL SUPPLY — 63 items
ADH SKN CLS APL DERMABOND .7 (GAUZE/BANDAGES/DRESSINGS) ×2
BAG SPEC THK2 15X12 ZIP CLS (MISCELLANEOUS)
BAG ZIPLOCK 12X15 (MISCELLANEOUS) IMPLANT
BANDAGE ACE 6X5 VEL STRL LF (GAUZE/BANDAGES/DRESSINGS) ×6 IMPLANT
BANDAGE ESMARK 6X9 LF (GAUZE/BANDAGES/DRESSINGS) ×1 IMPLANT
BLADE SAW SGTL 13.0X1.19X90.0M (BLADE) ×6 IMPLANT
BLADE SURG SZ10 CARB STEEL (BLADE) ×6 IMPLANT
BNDG CMPR 9X6 STRL LF SNTH (GAUZE/BANDAGES/DRESSINGS) ×1
BNDG COHESIVE 6X5 TAN STRL LF (GAUZE/BANDAGES/DRESSINGS) ×3 IMPLANT
BNDG ESMARK 6X9 LF (GAUZE/BANDAGES/DRESSINGS) ×3
BOWL SMART MIX CTS (DISPOSABLE) ×6 IMPLANT
CAPT KNEE TOTAL 3 ATTUNE ×4 IMPLANT
CEMENT HV SMART SET (Cement) ×8 IMPLANT
CLEANER TIP ELECTROSURG 2X2 (MISCELLANEOUS) ×3 IMPLANT
COVER SURGICAL LIGHT HANDLE (MISCELLANEOUS) ×3 IMPLANT
CUFF TOURN SGL QUICK 34 (TOURNIQUET CUFF) ×6
CUFF TRNQT CYL 34X4X40X1 (TOURNIQUET CUFF) ×2 IMPLANT
DECANTER SPIKE VIAL GLASS SM (MISCELLANEOUS) ×5 IMPLANT
DERMABOND ADVANCED (GAUZE/BANDAGES/DRESSINGS) ×4
DERMABOND ADVANCED .7 DNX12 (GAUZE/BANDAGES/DRESSINGS) ×2 IMPLANT
DRAPE EXTREMITY BILATERAL (DRAPES) ×3 IMPLANT
DRAPE INCISE IOBAN 66X45 STRL (DRAPES) ×3 IMPLANT
DRAPE SHEET LG 3/4 BI-LAMINATE (DRAPES) ×6 IMPLANT
DRAPE U-SHAPE 47X51 STRL (DRAPES) ×12 IMPLANT
DRESSING AQUACEL AG SP 3.5X10 (GAUZE/BANDAGES/DRESSINGS) ×2 IMPLANT
DRSG AQUACEL AG ADV 3.5X14 (GAUZE/BANDAGES/DRESSINGS) ×2 IMPLANT
DRSG AQUACEL AG SP 3.5X10 (GAUZE/BANDAGES/DRESSINGS) ×3
DURAPREP 26ML APPLICATOR (WOUND CARE) ×6 IMPLANT
ELECT CAUTERY BLADE 6.4 (BLADE) ×3 IMPLANT
ELECT CAUTERY BLADE TIP 2.5 (TIP) ×3
ELECT REM PT RETURN 15FT ADLT (MISCELLANEOUS) ×3 IMPLANT
ELECTRODE CAUTERY BLDE TIP 2.5 (TIP) ×1 IMPLANT
FACESHIELD WRAPAROUND (MASK) ×15 IMPLANT
FACESHIELD WRAPAROUND OR TEAM (MASK) ×3 IMPLANT
GLOVE BIO SURGEON STRL SZ7 (GLOVE) ×2 IMPLANT
GLOVE BIOGEL M 7.0 STRL (GLOVE) ×4 IMPLANT
GLOVE BIOGEL PI IND STRL 6.5 (GLOVE) IMPLANT
GLOVE BIOGEL PI IND STRL 7.5 (GLOVE) ×1 IMPLANT
GLOVE BIOGEL PI IND STRL 8.5 (GLOVE) ×1 IMPLANT
GLOVE BIOGEL PI INDICATOR 6.5 (GLOVE) ×2
GLOVE BIOGEL PI INDICATOR 7.5 (GLOVE) ×10
GLOVE BIOGEL PI INDICATOR 8.5 (GLOVE) ×2
GLOVE ECLIPSE 8.0 STRL XLNG CF (GLOVE) ×12 IMPLANT
GLOVE ORTHO TXT STRL SZ7.5 (GLOVE) ×9 IMPLANT
GOWN STRL REUS W/TWL LRG LVL3 (GOWN DISPOSABLE) ×9 IMPLANT
GOWN STRL REUS W/TWL XL LVL3 (GOWN DISPOSABLE) ×5 IMPLANT
HANDPIECE INTERPULSE COAX TIP (DISPOSABLE) ×3
MANIFOLD NEPTUNE II (INSTRUMENTS) ×3 IMPLANT
PACK TOTAL KNEE CUSTOM (KITS) ×3 IMPLANT
SET HNDPC FAN SPRY TIP SCT (DISPOSABLE) ×1 IMPLANT
SET PAD KNEE POSITIONER (MISCELLANEOUS) ×6 IMPLANT
SPONGE LAP 18X18 5 PK (GAUZE/BANDAGES/DRESSINGS) ×2 IMPLANT
SPONGE LAP 18X18 X RAY DECT (DISPOSABLE) ×2 IMPLANT
STOCKINETTE 8 INCH (MISCELLANEOUS) ×3 IMPLANT
SUT MNCRL AB 4-0 PS2 18 (SUTURE) ×6 IMPLANT
SUT STRATAFIX PDS+ 0 24IN (SUTURE) ×4 IMPLANT
SUT VIC AB 1 CT1 36 (SUTURE) ×6 IMPLANT
SUT VIC AB 2-0 CT1 27 (SUTURE) ×18
SUT VIC AB 2-0 CT1 TAPERPNT 27 (SUTURE) ×4 IMPLANT
SYRINGE 60CC LL (MISCELLANEOUS) ×3 IMPLANT
TRAY FOLEY W/METER SILVER 16FR (SET/KITS/TRAYS/PACK) ×3 IMPLANT
WRAP KNEE MAXI GEL POST OP (GAUZE/BANDAGES/DRESSINGS) ×6 IMPLANT
YANKAUER SUCT BULB TIP 10FT TU (MISCELLANEOUS) ×2 IMPLANT

## 2017-05-20 NOTE — Progress Notes (Signed)
Pt states that he will self administer CPAP when ready for bed.  RT to monitor and assess as needed.  

## 2017-05-20 NOTE — Plan of Care (Signed)
Plan of care reviewed with pt and spouse, both attentive and verbalized understanding. This RN specifically reviewed pain management, medication schedule during the night, and safety education.

## 2017-05-20 NOTE — Discharge Instructions (Addendum)

## 2017-05-20 NOTE — Anesthesia Preprocedure Evaluation (Signed)
Anesthesia Evaluation  Patient identified by MRN, date of birth, ID band Patient awake    Reviewed: Allergy & Precautions, NPO status , Patient's Chart, lab work & pertinent test results  Airway Mallampati: II  TM Distance: >3 FB Neck ROM: Full    Dental  (+) Chipped, Dental Advisory Given,    Pulmonary sleep apnea and Continuous Positive Airway Pressure Ventilation , Current Smoker,    breath sounds clear to auscultation       Cardiovascular hypertension, Pt. on medications + dysrhythmias Atrial Fibrillation  Rhythm:Regular Rate:Normal     Neuro/Psych PSYCHIATRIC DISORDERS Depression    GI/Hepatic Neg liver ROS, GERD  Medicated,  Endo/Other  negative endocrine ROS  Renal/GU negative Renal ROS     Musculoskeletal  (+) Arthritis , Osteoarthritis,    Abdominal (+) + obese,   Peds  Hematology negative hematology ROS (+)   Anesthesia Other Findings   Reproductive/Obstetrics negative OB ROS                             Lab Results  Component Value Date   WBC 9.5 05/15/2017   HGB 15.1 05/15/2017   HCT 45.8 05/15/2017   MCV 91.6 05/15/2017   PLT 229 05/15/2017   Lab Results  Component Value Date   CREATININE 0.94 05/15/2017   BUN 16 05/15/2017   NA 141 05/15/2017   K 3.8 05/15/2017   CL 106 05/15/2017   CO2 25 05/15/2017   No results found for: INR, PROTIME  EKG: normal sinus rhythm.   Anesthesia Physical Anesthesia Plan  ASA: II  Anesthesia Plan: General   Post-op Pain Management: GA combined w/ Regional for post-op pain   Induction: Intravenous  PONV Risk Score and Plan: 2 and Ondansetron, Dexamethasone and Midazolam  Airway Management Planned: LMA  Additional Equipment: None  Intra-op Plan:   Post-operative Plan: Extubation in OR  Informed Consent: I have reviewed the patients History and Physical, chart, labs and discussed the procedure including the risks,  benefits and alternatives for the proposed anesthesia with the patient or authorized representative who has indicated his/her understanding and acceptance.   Dental advisory given  Plan Discussed with: CRNA  Anesthesia Plan Comments: (Pt declined spinal. )        Anesthesia Quick Evaluation

## 2017-05-20 NOTE — Interval H&P Note (Signed)
History and Physical Interval Note:  05/20/2017 7:07 AM  Sean Morales  has presented today for surgery, with the diagnosis of Bilateral knee osteoarthritis  The various methods of treatment have been discussed with the patient and family. After consideration of risks, benefits and other options for treatment, the patient has consented to  Procedure(s) with comments: TOTAL KNEE BILATERAL (Bilateral) - 2.5 hrs as a surgical intervention .  The patient's history has been reviewed, patient examined, no change in status, stable for surgery.  I have reviewed the patient's chart and labs.  Questions were answered to the patient's satisfaction.     Mauri Pole

## 2017-05-20 NOTE — Evaluation (Signed)
Physical Therapy Evaluation Patient Details Name: Sean Morales MRN: [id number] DOB: [birth year] Today's Date: 05/20/2017   History of Present Illness  bil TKA  Clinical Impression  The patient stood and ambulated x 5' with RW and 2 assist.  Plans Dc home. Patient unsure of follow up PT after Dc Pt admitted with above diagnosis. Pt currently with functional limitations due to the deficits listed below (see PT Problem List).  Pt will benefit from skilled PT to increase their independence and safety with mobility to allow discharge to the venue listed below.       Follow Up Recommendations Follow surgeon's recommendation for DC plan and follow-up therapies    Equipment Recommendations  None recommended by PT    Recommendations for Other Services OT consult     Precautions / Restrictions Precautions Precautions: Knee;Fall      Mobility  Bed Mobility Overal bed mobility: Needs Assistance Bed Mobility: Supine to Sit;Sit to Supine     Supine to sit: Min assist Sit to supine: Mod assist   General bed mobility comments: assist with legs  and trunk,  Transfers Overall transfer level: Needs assistance Equipment used: Rolling walker (2 wheeled) Transfers: Sit to/from Stand Sit to Stand: +2 physical assistance;Mod assist;+2 safety/equipment;From elevated surface         General transfer comment: assist to rise, bed raised to assist standing  Ambulation/Gait Ambulation/Gait assistance: Mod assist;+2 physical assistance;+2 safety/equipment Ambulation Distance (Feet): 5 Feet Assistive device: Rolling walker (2 wheeled) Gait Pattern/deviations: Step-to pattern     General Gait Details: 5 ' forwards and backwards.   Stairs            Wheelchair Mobility    Modified Rankin (Stroke Patients Only)       Balance                                             Pertinent Vitals/Pain Pain Assessment: 0-10 Pain Location: r=1 , l=3 Pain Descriptors  / Indicators: Discomfort;Aching Pain Intervention(s): Limited activity within patient's tolerance;Premedicated before session;Ice applied    Home Living Family/patient expects to be discharged to:: Private residence Living Arrangements: Spouse/significant other Available Help at Discharge: Family Type of Home: House Home Access: Stairs to enter Entrance Stairs-Rails: None Entrance Stairs-Number of Steps: 4 Home Layout: Multi-level Home Equipment: Environmental consultant - 2 wheels;Cane - single point      Prior Function Level of Independence: Independent               Hand Dominance        Extremity/Trunk Assessment   Upper Extremity Assessment Upper Extremity Assessment: Overall WFL for tasks assessed    Lower Extremity Assessment Lower Extremity Assessment: RLE deficits/detail;LLE deficits/detail RLE Deficits / Details: able to perform SLR on r  with effort LLE Deficits / Details: same        Communication   Communication: No difficulties  Cognition Arousal/Alertness: Awake/alert Behavior During Therapy: WFL for tasks assessed/performed Overall Cognitive Status: Within Functional Limits for tasks assessed                                        General Comments      Exercises     Assessment/Plan    PT Assessment Patient needs continued PT services  PT  Problem List Decreased strength;Pain;Decreased range of motion;Decreased activity tolerance;Decreased knowledge of use of DME;Decreased safety awareness;Decreased mobility;Decreased knowledge of precautions       PT Treatment Interventions DME instruction;Therapeutic exercise;Gait training;Stair training;Functional mobility training;Therapeutic activities    PT Goals (Current goals can be found in the Care Plan section)  Acute Rehab PT Goals Patient Stated Goal: to walk go home PT Goal Formulation: With patient/family Time For Goal Achievement: 05/24/17 Potential to Achieve Goals: Good    Frequency  7X/week   Barriers to discharge        Co-evaluation               AM-PAC PT "6 Clicks" Daily Activity  Outcome Measure Difficulty turning over in bed (including adjusting bedclothes, sheets and blankets)?: Unable Difficulty moving from lying on back to sitting on the side of the bed? : Unable Difficulty sitting down on and standing up from a chair with arms (e.g., wheelchair, bedside commode, etc,.)?: Unable Help needed moving to and from a bed to chair (including a wheelchair)?: Total Help needed walking in hospital room?: Total Help needed climbing 3-5 steps with a railing? : Total 6 Click Score: 6    End of Session Equipment Utilized During Treatment: Gait belt Activity Tolerance: Patient tolerated treatment well Patient left: in bed;with call bell/phone within reach;with family/visitor present Nurse Communication: Mobility status PT Visit Diagnosis: Unsteadiness on feet (R26.81);Pain Pain - Right/Left: Left Pain - part of body: Knee    Time: 1600-1636 PT Time Calculation (min) (ACUTE ONLY): 36 min   Charges:   PT Evaluation $PT Eval Low Complexity: 1 Low PT Treatments $Gait Training: 8-22 mins   PT G Codes:        {Calissa Swenor PT [phone]   [name] 05/20/2017, 6:22 PM

## 2017-05-20 NOTE — Anesthesia Procedure Notes (Signed)
Anesthesia Regional Block: Adductor canal block   Pre-Anesthetic Checklist: ,, timeout performed, Correct Patient, Correct Site, Correct Laterality, Correct Procedure, Correct Position, site marked, Risks and benefits discussed,  Surgical consent,  Pre-op evaluation,  At surgeon's request and post-op pain management  Laterality: Right  Prep: chloraprep       Needles:  Injection technique: Single-shot  Needle Type: Echogenic Needle     Needle Length: 9cm  Needle Gauge: 21     Additional Needles:   Procedures:,,,, ultrasound used (permanent image in chart),,,,  Narrative:  Start time: 05/20/2017 7:20 AM End time: 05/20/2017 7:25 AM Injection made incrementally with aspirations every 5 mL.  Performed by: Personally  Anesthesiologist: Effie Berkshire, MD  Additional Notes: Patient tolerated the procedure well. Local anesthetic introduced in an incremental fashion under minimal resistance after negative aspirations. No paresthesias were elicited. After completion of the procedure, no acute issues were identified and patient continued to be monitored by RN.

## 2017-05-20 NOTE — Transfer of Care (Signed)
Immediate Anesthesia Transfer of Care Note  Patient: Sean Morales  Procedure(s) Performed: TOTAL KNEE BILATERAL (Bilateral )  Patient Location: PACU  Anesthesia Type:General  Level of Consciousness: awake, alert  and oriented  Airway & Oxygen Therapy: Patient Spontanous Breathing and Patient connected to face mask oxygen  Post-op Assessment: Report given to RN  Post vital signs: Reviewed and stable  Last Vitals:  Vitals:   05/20/17 0555  BP: (!) 150/94  Pulse: 60  Resp: 18  Temp: 37.3 C  SpO2: 100%    Last Pain:  Vitals:   05/20/17 0555  TempSrc: Oral         Complications: No apparent anesthesia complications

## 2017-05-20 NOTE — Anesthesia Procedure Notes (Signed)
Procedure Name: LMA Insertion Date/Time: 05/20/2017 7:37 AM Performed by: Lavina Hamman, CRNA Pre-anesthesia Checklist: Patient identified, Emergency Drugs available, Suction available and Patient being monitored Patient Re-evaluated:Patient Re-evaluated prior to induction Oxygen Delivery Method: Circle System Utilized Preoxygenation: Pre-oxygenation with 100% oxygen Induction Type: IV induction Ventilation: Mask ventilation without difficulty LMA: LMA inserted LMA Size: 4.0 Number of attempts: 1 Airway Equipment and Method: Bite block Placement Confirmation: positive ETCO2 Tube secured with: Tape Dental Injury: Teeth and Oropharynx as per pre-operative assessment

## 2017-05-20 NOTE — Op Note (Signed)
NAME:  Sean Morales                      MEDICAL RECORD NO.:  [id number]                             FACILITY:  [facility]      PHYSICIAN:  Pietro Cassis. Alvan Dame, M.D.  DATE OF BIRTH:  06-06-1960      DATE OF PROCEDURE:  05/20/2017                                     OPERATIVE REPORT  This procedure was a simultaneously performed bilateral total knee replacements.  We began on the left knee.  Below are templated operative notes detailing components used and generalized technique.  There were no specific complications.  Only thing to note is that he had a previous right knee incision when he was 57 years old for knee reconstruction.  This was utilized.  It was more medial than typical midline.     PREOPERATIVE DIAGNOSIS:  Left knee osteoarthritis.      POSTOPERATIVE DIAGNOSIS:  Left knee osteoarthritis.      FINDINGS:  The patient was noted to have complete loss of cartilage and   bone-on-bone arthritis with associated osteophytes in the medial and patellofemoral compartments of   the knee with a significant synovitis and associated effusion.      PROCEDURE:  Left total knee replacement.      COMPONENTS USED:  DePuy Attune rotating platform posterior stabilized knee   system, a size 8 femur, 8 tibia, size 7 mm PS AOX insert, and 38 anatomic patellar   button.      SURGEON:  Pietro Cassis. Alvan Dame, M.D.      ASSISTANT:  Danae Orleans, PA-C.      ANESTHESIA:  General and Regional.      SPECIMENS:  None.      COMPLICATION:  None.      DRAINS:  None.  EBL: <100      TOURNIQUET TIME:  27 min at 228mmHg     The patient was stable to the recovery room.      INDICATION FOR PROCEDURE:  Sean Morales is a 57 y.o. male patient of   mine.  The patient had been seen, evaluated, and treated conservatively in the   office with medication, activity modification, and injections.  The patient had   radiographic changes of bone-on-bone arthritis with endplate sclerosis and osteophytes noted,  bilaterally      The patient failed conservative measures including medication, injections, and activity modification, and at this point was ready for more definitive measures.   Based on the radiographic changes and failed conservative measures, the patient   decided to proceed with total knee replacement.  Based on the bilateral nature of his osteoarthritis and pain he wished to proceed with a simultaneously performed procedure versus staged.  This is also important for him for getting back to his normal activities and work.  We reviewed the specific risks, pros and cons of bilateral versus staged knee replacements.  Specific risks of infection,   DVT, component failure, need for revision surgery, postop course, and   expectations were all   discussed and reviewed.  Consent was obtained for benefit of pain   relief.      PROCEDURE IN  DETAIL:  The patient was brought to the operative theater.   Once adequate anesthesia, preoperative antibiotics, 2 gm of Ancef, 1 gm of Tranexamic Acid, and 10 mg of Decadron administered, the patient was positioned supine with both thigh tourniquets placed.  Both  lower extremities were prepped and draped in sterile fashion.  A time-   out was performed identifying the patient, planned procedure, and   extremity beginning first with the left knee.      The both lower extremities was placed in the [facility] leg holder.  The left leg was   exsanguinated, tourniquet elevated to 250 mmHg.  A midline incision was   made followed by median parapatellar arthrotomy.  Following initial   exposure, attention was first directed to the patella.  Precut   measurement was noted to be 25 mm.  I resected down to 14 mm and used a   38 anatomic patellar button to restore patellar height as well as cover the cut   surface.      The lug holes were drilled and a metal shim was placed to protect the   patella from retractors and saw blades.      At this point, attention was now  directed to the femur.  The femoral   canal was opened with a drill, irrigated to try to prevent fat emboli.  An   intramedullary rod was passed at 5 degrees valgus, 9 mm of bone was   resected off the distal femur.  Following this resection, the tibia was   subluxated anteriorly.  Using the extramedullary guide, 2 mm of bone was resected off   the proximal medial tibia.  We confirmed the gap would be   stable medially and laterally with a size 5 spacer block as well as confirmed   the cut was perpendicular in the coronal plane, checking with an alignment rod.      Once this was done, I sized the femur to be a size 8 in the anterior-   posterior dimension, chose a standard component based on medial and   lateral dimension.  The size 8 rotation block was then pinned in   position anterior referenced using the C-clamp to set rotation.  The   anterior, posterior, and  chamfer cuts were made without difficulty nor   notching making certain that I was along the anterior cortex to help   with flexion gap stability.      The final box cut was made off the lateral aspect of distal femur.      At this point, the tibia was sized to be a size 8, the size 8 tray was   then pinned in position through the medial third of the tubercle,   drilled, and keel punched.  Medial osteophytes were debrided off the tibia and sclerotic bone drilled for cement interdigitation. Trial reduction was now carried with a 8 femur,  8 tibia, a size 6 then 7 mm PS insert, and the 38 anatomic patella botton.  The knee was brought to   extension, full extension with good flexion stability with the patella   tracking through the trochlea without application of pressure.  Given   all these findings the femoral lug holes were drilled and then the trial components removed.  Final components were   opened and cement was mixed.  The knee was irrigated with normal saline   solution and pulse lavage.  The synovial lining was   then  injected with  one half of the combination of 30 cc of 0.25% Marcaine with epinephrine and 1 cc of Toradol plus 30 cc of NS for a total of 61 cc.      The knee was irrigated.  Final implants were then cemented onto clean and   dried cut surfaces of bone with the knee brought to extension with a size 7   mm PS trial insert.      Once the cement had fully cured, the excess cement was removed   throughout the knee.  I confirmed I was satisfied with the range of   motion and stability, and the final size 7 mm PS AOX insert was chosen.  It was   placed into the knee.      The tourniquet had been let down at 27 minutes.  No significant   hemostasis required.  The   extensor mechanism was then reapproximated using #1 Vicryl and #1 Stratafix sutures with the knee   in flexion.  The   remaining wound was closed with 2-0 Vicryl and running 4-0 Monocryl.   The knee was cleaned, dried, dressed sterilely using Dermabond and   Aquacel dressing.  The patient was then   brought to recovery room in stable condition, tolerating the procedure   well.   Please note that Physician Assistant, Danae Orleans, PA-C, was present for the entirety of the case, and was utilized for pre-operative positioning, peri-operative retractor management, general facilitation of the procedure.  He was also utilized for primary wound closure at the end of the case.  Upon closure of the left knee we began on the right knee            Pietro Cassis. Alvan Dame, M.D.    05/20/2017 7:07 AM  NAME:  Sean Morales                      MEDICAL RECORD NO.:  [id number]                             FACILITY:  [facility]      PHYSICIAN:  Pietro Cassis. Alvan Dame, M.D.  DATE OF BIRTH:  1960-07-20      DATE OF PROCEDURE:  05/20/2017                                     OPERATIVE REPORT         PREOPERATIVE DIAGNOSIS:  Right knee osteoarthritis.      POSTOPERATIVE DIAGNOSIS:  Right knee osteoarthritis.      FINDINGS:  The patient was noted to have  complete loss of cartilage and   bone-on-bone arthritis with associated osteophytes in the medial and patellofemoral compartments of   the knee with a significant synovitis and associated effusion.      PROCEDURE:  Right total knee replacement.      COMPONENTS USED:  DePuy Attune rotating platform posterior stabilized knee   system, a size 7 femur, 7 tibia, size 7 mm PS AOX insert, and 38 anatomic patellar   button.      SURGEON:  Pietro Cassis. Alvan Dame, M.D.      ASSISTANT:  Danae Orleans, PA-C.      ANESTHESIA:  General and Regional.      SPECIMENS:  None.      COMPLICATION:  None.      DRAINS:  None.  EBL: <150cc      TOURNIQUET TIME:  40 min at 250 mmHg      The patient was stable to the recovery room.      INDICATION FOR PROCEDURE:  Sean Morales is a 57 y.o. male patient of   mine.  See above     PROCEDURE IN DETAIL:    The right leg was exsanguinated, tourniquet elevated to 250 mmHg.  An old peri-midline incision was   made followed by median parapatellar arthrotomy.  Following initial   exposure, attention was first directed to the patella.  Precut   measurement was noted to be 26 mm.  I resected down to 15 mm and used a   38 anatomic patellar anatomic button to restore patellar height as well as cover the cut   surface.      The lug holes were drilled and a metal shim was placed to protect the   patella from retractors and saw blades.      At this point, attention was now directed to the femur.  The femoral   canal was opened with a drill, irrigated to try to prevent fat emboli.  An   intramedullary rod was passed at 5 degrees valgus, 9 mm of bone was   resected off the distal femur.  Following this resection, the tibia was   subluxated anteriorly.  Using the extramedullary guide, 2 mm of bone was resected off   the proximal medial tibia.  We confirmed the gap would be   stable medially and laterally with a size 5 spacer block as well as confirmed   the cut was  perpendicular in the coronal plane, checking with an alignment rod.      Once this was done, I sized the femur to be a size 7 in the anterior-   posterior dimension, chose a standard component based on medial and   lateral dimension.  The size 7 rotation block was then pinned in   position anterior referenced using the C-clamp to set rotation.  The   anterior, posterior, and  chamfer cuts were made without difficulty nor   notching making certain that I was along the anterior cortex to help   with flexion gap stability.      The final box cut was made off the lateral aspect of distal femur.      At this point, the tibia was sized to be a size 7, the size 7 tray was   then pinned in position through the medial third of the tubercle,   drilled, and keel punched.  Medial osteophytes were debrided and sclerotic bone drilled for cement interdigitation.  Trial reduction was now carried with a 7 femur,  7 tibia, a size 6 then 7 mm PS insert, and the 38 anatomic patella botton.  The knee was brought to   extension, full extension with good flexion stability with the patella   tracking through the trochlea without application of pressure.  Given   all these findings the femoral lug holes were drilled and then the trial components removed.  Final components were   opened and cement was mixed.  The knee was irrigated with normal saline   solution and pulse lavage.  The synovial lining was   then injected with one half of the combination of 30 cc of 0.25% Marcaine with epinephrine and 1 cc of Toradol plus 30 cc of NS for total of 61 cc.  The knee was irrigated.  Final implants were then cemented onto clean and   dried cut surfaces of bone with the knee brought to extension with a size 7   mm trial insert.      Once the cement had fully cured, the excess cement was removed   throughout the knee.  I confirmed I was satisfied with the range of   motion and stability, and the final size 7 mm insert  was chosen.  It was   placed into the knee.      The tourniquet had been let down at 40 minutes.  No significant   hemostasis required.  The   extensor mechanism was then reapproximated using #1 Vicryl and #1 Stratafix sutures with the knee   in flexion.  The   remaining wound was closed with 2-0 Vicryl and running 4-0 Monocryl.   The knee was cleaned, dried, dressed sterilely using Dermabond and   Aquacel dressing.  The patient was then   brought to recovery room in stable condition, tolerating the procedure   well.   Please note that Physician Assistant, Danae Orleans, PA-C, was present for the entirety of the case, and was utilized for pre-operative positioning, peri-operative retractor management, general facilitation of the procedure.  He was also utilized for primary wound closure at the end of the case.              Pietro Cassis Alvan Dame, M.D.    05/20/2017 7:07 AM

## 2017-05-20 NOTE — Anesthesia Procedure Notes (Signed)
Anesthesia Regional Block: Adductor canal block   Pre-Anesthetic Checklist: ,, timeout performed, Correct Patient, Correct Site, Correct Laterality, Correct Procedure, Correct Position, site marked, Risks and benefits discussed,  Surgical consent,  Pre-op evaluation,  At surgeon's request and post-op pain management  Laterality: Left  Prep: chloraprep       Needles:  Injection technique: Single-shot  Needle Type: Echogenic Needle     Needle Length: 9cm  Needle Gauge: 21     Additional Needles:   Procedures:,,,, ultrasound used (permanent image in chart),,,,  Narrative:  Start time: 05/20/2017 7:15 AM End time: 05/20/2017 7:20 AM Injection made incrementally with aspirations every 5 mL.  Performed by: Personally  Anesthesiologist: Effie Berkshire, MD  Additional Notes: Patient tolerated the procedure well. Local anesthetic introduced in an incremental fashion under minimal resistance after negative aspirations. No paresthesias were elicited. After completion of the procedure, no acute issues were identified and patient continued to be monitored by RN.

## 2017-05-20 NOTE — Plan of Care (Signed)
Plan of care discussed.   

## 2017-05-20 NOTE — Anesthesia Postprocedure Evaluation (Signed)
Anesthesia Post Note  Patient: Sean Morales  Procedure(s) Performed: TOTAL KNEE BILATERAL (Bilateral )     Patient location during evaluation: PACU Anesthesia Type: General and Regional Level of consciousness: awake and alert Pain management: pain level controlled Vital Signs Assessment: post-procedure vital signs reviewed and stable Respiratory status: spontaneous breathing, nonlabored ventilation, respiratory function stable and patient connected to nasal cannula oxygen Cardiovascular status: blood pressure returned to baseline and stable Postop Assessment: no apparent nausea or vomiting Anesthetic complications: no    Last Vitals:  Vitals:   05/20/17 1115 05/20/17 1212  BP: (!) 140/95 135/84  Pulse: 67 68  Resp: 14 20  Temp: 36.9 C   SpO2: 98% 98%    Last Pain:  Vitals:   05/20/17 1212  TempSrc:   PainSc: 3                  Effie Berkshire

## 2017-05-21 LAB — CBC
HCT: 34.3 % — ABNORMAL LOW (ref 39.0–52.0)
Hemoglobin: 11.2 g/dL — ABNORMAL LOW (ref 13.0–17.0)
MCH: 30.4 pg (ref 26.0–34.0)
MCHC: 32.7 g/dL (ref 30.0–36.0)
MCV: 93.2 fL (ref 78.0–100.0)
PLATELETS: 219 10*3/uL (ref 150–400)
RBC: 3.68 MIL/uL — ABNORMAL LOW (ref 4.22–5.81)
RDW: 14.1 % (ref 11.5–15.5)
WBC: 14.9 10*3/uL — ABNORMAL HIGH (ref 4.0–10.5)

## 2017-05-21 LAB — BASIC METABOLIC PANEL
Anion gap: 7 (ref 5–15)
BUN: 12 mg/dL (ref 6–20)
CO2: 24 mmol/L (ref 22–32)
CREATININE: 0.85 mg/dL (ref 0.61–1.24)
Calcium: 7.9 mg/dL — ABNORMAL LOW (ref 8.9–10.3)
Chloride: 107 mmol/L (ref 101–111)
Glucose, Bld: 161 mg/dL — ABNORMAL HIGH (ref 65–99)
Potassium: 3.9 mmol/L (ref 3.5–5.1)
SODIUM: 138 mmol/L (ref 135–145)

## 2017-05-21 MED ORDER — POTASSIUM CHLORIDE CRYS ER 20 MEQ PO TBCR
20.0000 meq | EXTENDED_RELEASE_TABLET | Freq: Once | ORAL | Status: AC
  Administered 2017-05-21: 13:00:00 20 meq via ORAL
  Filled 2017-05-21: qty 1

## 2017-05-21 NOTE — Progress Notes (Signed)
Physical Therapy Treatment Patient Details Name: Sean Morales MRN: [id number] DOB: Oct 31, 1960 Today's Date: 05/21/2017    History of Present Illness bil TKA    PT Comments    The patient tolerated ambulating to BR, then OT in to evaluate. Continue PT plans DC tomorrow.   Follow Up Recommendations  Home health PT     Equipment Recommendations  None recommended by PT    Recommendations for Other Services       Precautions / Restrictions Precautions Precautions: Knee;Fall Restrictions Weight Bearing Restrictions: No    Mobility  Bed Mobility Overal bed mobility: Needs Assistance Bed Mobility: Supine to Sit     Supine to sit: Min guard     General bed mobility comments: patient assisted self.  Transfers Overall transfer level: Needs assistance Equipment used: Rolling walker (2 wheeled)   Sit to Stand: Min assist         General transfer comment: from bed raised, cues to walk legs back  Ambulation/Gait Ambulation/Gait assistance: Min assist Ambulation Distance (Feet): 20 Feet Assistive device: Rolling walker (2 wheeled) Gait Pattern/deviations: Step-to pattern Gait velocity: decr   General Gait Details: extra time, slowly per patient as he is unsure of the knees .   Stairs            Wheelchair Mobility    Modified Rankin (Stroke Patients Only)       Balance                                            Cognition Arousal/Alertness: Awake/alert Behavior During Therapy: WFL for tasks assessed/performed Overall Cognitive Status: Within Functional Limits for tasks assessed                                        Exercises Total Joint Exercises Ankle Circles/Pumps: AROM;Both;10 reps Quad Sets: AROM;Both;10 reps Towel Squeeze: AROM;Both;10 reps Heel Slides: AAROM;Both;10 reps Hip ABduction/ADduction: AROM;Both;10 reps Straight Leg Raises: AAROM;Both;10 reps Goniometric ROM: right 1-=7-. left 10-  70    General Comments        Pertinent Vitals/Pain Pain Assessment: 0-10 Pain Score: 3  Pain Location: 3 left, 1 right Pain Descriptors / Indicators: Discomfort;Sore Pain Intervention(s): Premedicated before session;Monitored during session;Repositioned    Home Living Family/patient expects to be discharged to:: Private residence Living Arrangements: Spouse/significant other Available Help at Discharge: Family         Home Equipment: Gilford Rile - 2 wheels;Cane - single point      Prior Function Level of Independence: Independent          PT Goals (current goals can now be found in the care plan section) Acute Rehab PT Goals Patient Stated Goal: walk Progress towards PT goals: Progressing toward goals    Frequency    7X/week      PT Plan Current plan remains appropriate    Co-evaluation PT/OT/SLP Co-Evaluation/Treatment: Yes Reason for Co-Treatment: For patient/therapist safety PT goals addressed during session: Mobility/safety with mobility OT goals addressed during session: ADL's and self-care      AM-PAC PT "6 Clicks" Daily Activity  Outcome Measure  Difficulty turning over in bed (including adjusting bedclothes, sheets and blankets)?: A Little Difficulty moving from lying on back to sitting on the side of the bed? : A Little Difficulty  sitting down on and standing up from a chair with arms (e.g., wheelchair, bedside commode, etc,.)?: A Little Help needed moving to and from a bed to chair (including a wheelchair)?: A Little Help needed walking in hospital room?: A Little Help needed climbing 3-5 steps with a railing? : Total 6 Click Score: 16    End of Session Equipment Utilized During Treatment: Gait belt Activity Tolerance: Patient tolerated treatment well Patient left: (with OT) Nurse Communication: Mobility status PT Visit Diagnosis: Unsteadiness on feet (R26.81);Pain Pain - Right/Left: Left     Time: 3810-1751 PT Time Calculation (min)  (ACUTE ONLY): 43 min  Charges:  $Gait Training: 8-22 mins $Therapeutic Exercise: 8-22 mins                    G CodesTresa Endo PT [phone]    [name] 05/21/2017, 12:47 PM

## 2017-05-21 NOTE — Evaluation (Signed)
Occupational Therapy Evaluation Patient Details Name: Sean Morales MRN: [id number] DOB: February 08, 1961 Today's Date: 05/21/2017    History of Present Illness bil TKA   Clinical Impression   Pt was admitted for the above sx.  Will follow pt in acute setting with supervision to min A level goals.     Follow Up Recommendations  Supervision/Assistance - 24 hour    Equipment Recommendations  3 in 1 bedside commode    Recommendations for Other Services       Precautions / Restrictions Precautions Precautions: Knee;Fall Restrictions Weight Bearing Restrictions: No      Mobility Bed Mobility               General bed mobility comments: PT assisted pt  Transfers   Equipment used: Rolling walker (2 wheeled)   Sit to Stand: Min assist;Mod assist         General transfer comment: mod A from bed; +2 safety; min A from 3:1 commode    Balance                                           ADL either performed or assessed with clinical judgement   ADL Overall ADL's : Needs assistance/impaired Eating/Feeding: Independent   Grooming: Set up;Sitting   Upper Body Bathing: Set up;Sitting   Lower Body Bathing: Moderate assistance;Sit to/from stand   Upper Body Dressing : Set up;Sitting   Lower Body Dressing: Maximal assistance;Sit to/from stand   Toilet Transfer: Minimal assistance;Ambulation;BSC;RW   Toileting- Clothing Manipulation and Hygiene: Minimal assistance;Sit to/from stand         General ADL Comments: ambulated to bathroom and performed transfer onto 3:1.  He will need this at home. Discussed tub bench:  will show him this on next visit     Vision         Perception     Praxis      Pertinent Vitals/Pain Pain Assessment: 0-10 Pain Score: 3  Pain Location: bil knees Pain Descriptors / Indicators: Discomfort Pain Intervention(s): Limited activity within patient's tolerance;Monitored during session;Premedicated before  session;Repositioned;Ice applied     Hand Dominance     Extremity/Trunk Assessment Upper Extremity Assessment Upper Extremity Assessment: Overall WFL for tasks assessed           Communication Communication Communication: No difficulties   Cognition Arousal/Alertness: Awake/alert Behavior During Therapy: WFL for tasks assessed/performed Overall Cognitive Status: Within Functional Limits for tasks assessed                                     General Comments       Exercises     Shoulder Instructions      Home Living Family/patient expects to be discharged to:: Private residence Living Arrangements: Spouse/significant other Available Help at Discharge: Family               Bathroom Shower/Tub: Tub/shower unit;Curtain   Bathroom Toilet: Standard     Home Equipment: Environmental consultant - 2 wheels;Cane - single point          Prior Functioning/Environment Level of Independence: Independent                 OT Problem List: Decreased activity tolerance;Pain;Decreased knowledge of use of DME or AE      OT Treatment/Interventions:  Self-care/ADL training;DME and/or AE instruction;Patient/family education    OT Goals(Current goals can be found in the care plan section) Acute Rehab OT Goals Patient Stated Goal: walk OT Goal Formulation: With patient Time For Goal Achievement: 06/04/17 Potential to Achieve Goals: Good ADL Goals Pt Will Perform Grooming: with min guard assist;standing Pt Will Transfer to Toilet: with min guard assist;ambulating;bedside commode Pt Will Perform Tub/Shower Transfer: Tub transfer;tub bench;ambulating;with min assist(vs verbalize tub readiness)  OT Frequency: Min 2X/week   Barriers to D/C:            Co-evaluation PT/OT/SLP Co-Evaluation/Treatment: Yes Reason for Co-Treatment: For patient/therapist safety PT goals addressed during session: Mobility/safety with mobility OT goals addressed during session: ADL's and  self-care      AM-PAC PT "6 Clicks" Daily Activity     Outcome Measure Help from another person eating meals?: None Help from another person taking care of personal grooming?: A Little Help from another person toileting, which includes using toliet, bedpan, or urinal?: A Little Help from another person bathing (including washing, rinsing, drying)?: A Lot Help from another person to put on and taking off regular upper body clothing?: A Little Help from another person to put on and taking off regular lower body clothing?: A Lot 6 Click Score: 17   End of Session    Activity Tolerance: Patient tolerated treatment well Patient left: in chair;with call bell/phone within reach  OT Visit Diagnosis: Pain Pain - part of body: Knee(bil)                Time: 0929-1000 OT Time Calculation (min): 31 min Charges:  OT General Charges $OT Visit: 1 Visit OT Evaluation $OT Eval Low Complexity: 1 Low G-Codes:     Ute, OTR/L [phone] 05/21/2017  Nole Robey 05/21/2017, 10:25 AM

## 2017-05-21 NOTE — Progress Notes (Signed)
Patient ID: Sean Morales, male   DOB: 10/25/1960, 57 y.o.   MRN: [id number] Subjective: 1 Day Post-Op Procedure(s) (LRB): TOTAL KNEE BILATERAL (Bilateral)    Patient reports pain as moderate.  More early discomfort with his left knee but otherwise ok.  Up eating breakfast this am.  Ready to begin therapy. Few steps yesterday  Objective:   VITALS:   Vitals:   05/21/17 0141 05/21/17 0601  BP: 107/68 117/62  Pulse: (!) 47 63  Resp: 16 16  Temp: 98.3 F (36.8 C) 98.3 F (36.8 C)  SpO2: 98% 97%    Neurovascular intact Incision: dressing C/D/I - bilateral  LABS Recent Labs    05/21/17 0529  HGB 11.2*  HCT 34.3*  WBC 14.9*  PLT 219    Recent Labs    05/21/17 0529  NA 138  K 3.9  BUN 12  CREATININE 0.85  GLUCOSE 161*    No results for input(s): LABPT, INR in the last 72 hours.   Assessment/Plan: 1 Day Post-Op Procedure(s) (LRB): TOTAL KNEE BILATERAL (Bilateral)   Advance diet Up with therapy Plan for discharge tomorrow most likely Reviewed goals, to be reinforced with therapy

## 2017-05-21 NOTE — Progress Notes (Signed)
Physical Therapy Treatment Patient Details Name: EDUAR KUMPF MRN: [id number] DOB: [birth year] Today's Date: 05/21/2017    History of Present Illness bil TKA    PT Comments    The patient is progressing very well. Will practice steps next visit. Plans DC tomorrow,   Follow Up Recommendations  Home health PT     Equipment Recommendations  None recommended by PT    Recommendations for Other Services       Precautions / Restrictions Precautions Precautions: Knee;Fall    Mobility  Bed Mobility Overal bed mobility: Needs Assistance Bed Mobility: Sit to Supine     Supine to sit: Min guard     General bed mobility comments: patient assisted self using leg lifter  Transfers Overall transfer level: Needs assistance Equipment used: Rolling walker (2 wheeled) Transfers: Sit to/from Stand Sit to Stand: Min assist         General transfer comment: extra  effort with UE's to rise from recliner.  Ambulation/Gait Ambulation/Gait assistance: Min assist Ambulation Distance (Feet): 120 Feet Assistive device: Rolling walker (2 wheeled) Gait Pattern/deviations: Step-to pattern;Decreased step length - right;Decreased step length - left Gait velocity: decr   General Gait Details: cues for sequence, right knee slightly buckled x 1   Stairs            Wheelchair Mobility    Modified Rankin (Stroke Patients Only)       Balance                                            Cognition Arousal/Alertness: Awake/alert                                            Exercises Total Joint Exercises  Long Arc Quad: AROM;10 reps;Seated Knee Flexion: AROM;10 reps;Seated Goniometric ROM: right 1-=70-. left 10- 70    General Comments        Pertinent Vitals/Pain Pain Score: 3  Pain Location: 3 left, 1 right Pain Descriptors / Indicators: Discomfort;Sore Pain Intervention(s): Monitored during session;Premedicated before  session;Ice applied    Home Living                      Prior Function            PT Goals (current goals can now be found in the care plan section) Progress towards PT goals: Progressing toward goals    Frequency    7X/week      PT Plan Current plan remains appropriate    Co-evaluation PT/OT/SLP Co-Evaluation/Treatment: Yes Reason for Co-Treatment: For patient/therapist safety PT goals addressed during session: Mobility/safety with mobility OT goals addressed during session: ADL's and self-care      AM-PAC PT "6 Clicks" Daily Activity  Outcome Measure  Difficulty turning over in bed (including adjusting bedclothes, sheets and blankets)?: A Little Difficulty moving from lying on back to sitting on the side of the bed? : A Little Difficulty sitting down on and standing up from a chair with arms (e.g., wheelchair, bedside commode, etc,.)?: A Little Help needed moving to and from a bed to chair (including a wheelchair)?: A Little Help needed walking in hospital room?: A Little Help needed climbing 3-5 steps with a railing? : Total 6 Click Score:  16    End of Session Equipment Utilized During Treatment: Gait belt Activity Tolerance: Patient tolerated treatment well Patient left: in bed;with family/visitor present Nurse Communication: Mobility status PT Visit Diagnosis: Unsteadiness on feet (R26.81);Pain Pain - Right/Left: Left Pain - part of body: Knee     Time: 3016-0109 PT Time Calculation (min) (ACUTE ONLY): 37 min  Charges:  $Gait Training: 8-22 mins $Therapeutic Exercise: 8-22 mins                    G CodesTresa Endo PT [phone]    [name] 05/21/2017, 4:05 PM

## 2017-05-22 DIAGNOSIS — E669 Obesity, unspecified: Secondary | ICD-10-CM | POA: Diagnosis present

## 2017-05-22 LAB — BASIC METABOLIC PANEL
Anion gap: 9 (ref 5–15)
BUN: 16 mg/dL (ref 6–20)
CALCIUM: 8.4 mg/dL — AB (ref 8.9–10.3)
CO2: 28 mmol/L (ref 22–32)
Chloride: 103 mmol/L (ref 101–111)
Creatinine, Ser: 0.9 mg/dL (ref 0.61–1.24)
GFR calc Af Amer: >60 mL/min (ref >60)
GLUCOSE: 108 mg/dL — AB (ref 65–99)
Potassium: 4 mmol/L (ref 3.5–5.1)
Sodium: 140 mmol/L (ref 135–145)

## 2017-05-22 LAB — CBC
HCT: 36 % — ABNORMAL LOW (ref 39.0–52.0)
Hemoglobin: 11.5 g/dL — ABNORMAL LOW (ref 13.0–17.0)
MCH: 29.8 pg (ref 26.0–34.0)
MCHC: 31.9 g/dL (ref 30.0–36.0)
MCV: 93.3 fL (ref 78.0–100.0)
PLATELETS: 238 10*3/uL (ref 150–400)
RBC: 3.86 MIL/uL — ABNORMAL LOW (ref 4.22–5.81)
RDW: 14.2 % (ref 11.5–15.5)
WBC: 12.3 10*3/uL — ABNORMAL HIGH (ref 4.0–10.5)

## 2017-05-22 MED ORDER — OXYCODONE HCL 5 MG PO TABS
20.0000 mg | ORAL_TABLET | ORAL | Status: DC | PRN
  Administered 2017-05-22: 11:00:00 20 mg via ORAL
  Administered 2017-05-22: 08:00:00 15 mg via ORAL
  Administered 2017-05-22 – 2017-05-23 (×4): 20 mg via ORAL
  Filled 2017-05-22 (×6): qty 4

## 2017-05-22 MED ORDER — OXYCODONE HCL 5 MG PO TABS
10.0000 mg | ORAL_TABLET | ORAL | Status: DC | PRN
  Administered 2017-05-23 (×2): 10 mg via ORAL
  Filled 2017-05-22 (×2): qty 2

## 2017-05-22 NOTE — Progress Notes (Signed)
Occupational Therapy Treatment Patient Details Name: LUKASZ ROGUS MRN: [id number] DOB: 04-09-1960 Today's Date: 05/22/2017    History of present illness bil TKA   OT comments  Educated on tub bench, performed bathing and stood at sink for grooming  Follow Up Recommendations  Supervision/Assistance - 24 hour    Equipment Recommendations  3 in 1 bedside commode    Recommendations for Other Services      Precautions / Restrictions Precautions Precautions: Knee;Fall Restrictions Weight Bearing Restrictions: No       Mobility Bed Mobility            oob      Transfers   Equipment used: Rolling walker (2 wheeled)   Sit to Stand: Min assist         General transfer comment: extra  effort with UE's to rise from recliner and cues for sequence    Balance                                           ADL either performed or assessed with clinical judgement   ADL       Grooming: Wash/dry hands;Wash/dry face;Min guard;Standing                                 General ADL Comments: demonstrated tub transfer bench.  Pt washed from seated position with set up.  Pt had already cleaned peri area earlier when he used the commode.   Also ambulated to sink in bathroom     Vision       Perception     Praxis      Cognition Arousal/Alertness: Awake/alert Behavior During Therapy: WFL for tasks assessed/performed Overall Cognitive Status: Within Functional Limits for tasks assessed                                          Exercises     Shoulder Instructions       General Comments      Pertinent Vitals/ Pain       Pain Score: 4  Pain Location: bil knees Pain Descriptors / Indicators: Tightness Pain Intervention(s): Limited activity within patient's tolerance;Monitored during session;Repositioned  Home Living                                          Prior Functioning/Environment               Frequency           Progress Toward Goals  OT Goals(current goals can now be found in the care plan section)        Plan      Co-evaluation                 AM-PAC PT "6 Clicks" Daily Activity     Outcome Measure   Help from another person eating meals?: None Help from another person taking care of personal grooming?: A Little Help from another person toileting, which includes using toliet, bedpan, or urinal?: A Little Help from another person bathing (including washing, rinsing, drying)?: A Lot Help from another person to  put on and taking off regular upper body clothing?: A Little Help from another person to put on and taking off regular lower body clothing?: A Lot 6 Click Score: 17    End of Session    OT Visit Diagnosis: Pain Pain - part of body: Knee   Activity Tolerance Patient tolerated treatment well   Patient Left in chair;with call bell/phone within reach   Nurse Communication          Time: 4163-8453 OT Time Calculation (min): 31 min  Charges: OT General Charges $OT Visit: 1 Visit OT Treatments $Self Care/Home Management : 23-37 mins  Lesle Chris, OTR/L [phone] 05/22/2017   [place] 05/22/2017, 10:59 AM

## 2017-05-22 NOTE — Progress Notes (Signed)
Physical Therapy Treatment Patient Details Name: Sean Morales MRN: [id number] DOB: 20-Dec-1960 Today's Date: 05/22/2017    History of Present Illness bil TKA    PT Comments    POD # 2 pm session Assisted with amb a second time then back to bed to perform some heel slides using a long bed sheet to assist.  R knee ROM approx 65 degrees and L knee ROM approx 50 degrees after 10 reps.  Instructed on TE's to perform on own.  Applied ICE. Will practice 4 steps backward tomorrow.   Follow Up Recommendations  Home health PT     Equipment Recommendations  None recommended by PT    Recommendations for Other Services       Precautions / Restrictions Precautions Precautions: Knee;Fall Restrictions Weight Bearing Restrictions: No Other Position/Activity Restrictions: WBAT    Mobility  Bed Mobility Overal bed mobility: Needs Assistance Bed Mobility: Sit to Supine       Sit to supine: Mod assist   General bed mobility comments: assisted back to bed  Transfers Overall transfer level: Needs assistance Equipment used: Rolling walker (2 wheeled) Transfers: Sit to/from Stand Sit to Stand: Min assist         General transfer comment: slight posterior LOB    increased time   elevated surface sit to stand   Ambulation/Gait Ambulation/Gait assistance: Min assist;Min guard Ambulation Distance (Feet): 130 Feet Assistive device: Rolling walker (2 wheeled) Gait Pattern/deviations: Step-to pattern;Decreased step length - right;Decreased step length - left Gait velocity: decreased   General Gait Details: 25% VC's on proper walker to self distance and safety with turns pt present with posterior LOB   Stairs            Wheelchair Mobility    Modified Rankin (Stroke Patients Only)       Balance                                            Cognition Arousal/Alertness: Awake/alert Behavior During Therapy: WFL for tasks assessed/performed Overall  Cognitive Status: Within Functional Limits for tasks assessed                                        Exercises  10 reps HS R and L LE    General Comments        Pertinent Vitals/Pain Pain Score: 6  Pain Location: bil knees  L > R Pain Descriptors / Indicators: Tightness;Operative site guarding Pain Intervention(s): Repositioned;Monitored during session;Premedicated before session;Ice applied    Home Living                      Prior Function            PT Goals (current goals can now be found in the care plan section) Progress towards PT goals: Progressing toward goals    Frequency           PT Plan Current plan remains appropriate    Co-evaluation              AM-PAC PT "6 Clicks" Daily Activity  Outcome Measure  Difficulty turning over in bed (including adjusting bedclothes, sheets and blankets)?: A Little Difficulty moving from lying on back to sitting on the side of the bed? :  A Little Difficulty sitting down on and standing up from a chair with arms (e.g., wheelchair, bedside commode, etc,.)?: A Little Help needed moving to and from a bed to chair (including a wheelchair)?: A Little Help needed walking in hospital room?: A Little Help needed climbing 3-5 steps with a railing? : Total 6 Click Score: 16    End of Session Equipment Utilized During Treatment: Gait belt Activity Tolerance: Patient tolerated treatment well Patient left: in chair;with call bell/phone within reach Nurse Communication: Mobility status PT Visit Diagnosis: Unsteadiness on feet (R26.81);Pain Pain - Right/Left: Left Pain - part of body: Knee     Time: 1350-1420 PT Time Calculation (min) (ACUTE ONLY): 30 min  Charges:  $Gait Training: 8-22 mins $Therapeutic Exercise: 8-22 mins $Therapeutic Activity: 8-22 mins                    G Codes:       Rica Koyanagi  PTA [facility]  Acute  Rehab Pager      [phone]

## 2017-05-22 NOTE — Progress Notes (Signed)
Physical Therapy Treatment Patient Details Name: Sean Morales MRN: [id number] DOB: March 28, 1961 Today's Date: 05/22/2017    History of Present Illness bil TKA    PT Comments    POD # 2 am session Assisted OOB to amb an increased distance.  Performed some TKR TE's followed by ICE.  L knee is tighter and has more pain that R.    Follow Up Recommendations  Home health PT     Equipment Recommendations  None recommended by PT    Recommendations for Other Services       Precautions / Restrictions Precautions Precautions: Knee;Fall Restrictions Weight Bearing Restrictions: No Other Position/Activity Restrictions: WBAT    Mobility  Bed Mobility Overal bed mobility: Needs Assistance Bed Mobility: Supine to Sit       Sit to supine: Mod assist   General bed mobility comments: patient assisted self using leg lifter   increased time   Transfers Overall transfer level: Needs assistance Equipment used: Rolling walker (2 wheeled) Transfers: Sit to/from Stand Sit to Stand: Min assist         General transfer comment: slight posterior LOB    increased time   elevated surface sit to stand   Ambulation/Gait Ambulation/Gait assistance: Min assist;Min guard Ambulation Distance (Feet): 125 Feet Assistive device: Rolling walker (2 wheeled) Gait Pattern/deviations: Step-to pattern;Decreased step length - right;Decreased step length - left Gait velocity: decreased   General Gait Details: 25% VC's on proper walker to self distance and safety with turns pt present with posterior LOB   Stairs            Wheelchair Mobility    Modified Rankin (Stroke Patients Only)       Balance                                            Cognition Arousal/Alertness: Awake/alert Behavior During Therapy: WFL for tasks assessed/performed Overall Cognitive Status: Within Functional Limits for tasks assessed                                        Exercises      General Comments        Pertinent Vitals/Pain Pain Score: 6  Pain Location: bil knees  L > R Pain Descriptors / Indicators: Tightness;Operative site guarding Pain Intervention(s): Repositioned;Monitored during session;Premedicated before session;Ice applied    Home Living                      Prior Function            PT Goals (current goals can now be found in the care plan section) Progress towards PT goals: Progressing toward goals    Frequency           PT Plan Current plan remains appropriate    Co-evaluation              AM-PAC PT "6 Clicks" Daily Activity  Outcome Measure  Difficulty turning over in bed (including adjusting bedclothes, sheets and blankets)?: A Little Difficulty moving from lying on back to sitting on the side of the bed? : A Little Difficulty sitting down on and standing up from a chair with arms (e.g., wheelchair, bedside commode, etc,.)?: A Little Help needed moving to and from a  bed to chair (including a wheelchair)?: A Little Help needed walking in hospital room?: A Little Help needed climbing 3-5 steps with a railing? : Total 6 Click Score: 16    End of Session Equipment Utilized During Treatment: Gait belt Activity Tolerance: Patient tolerated treatment well Patient left: in chair;with call bell/phone within reach Nurse Communication: Mobility status PT Visit Diagnosis: Unsteadiness on feet (R26.81);Pain Pain - Right/Left: Left Pain - part of body: Knee     Time: 2229-7989 PT Time Calculation (min) (ACUTE ONLY): 40 min  Charges:  $Gait Training: 8-22 mins $Therapeutic Exercise: 8-22 mins $Therapeutic Activity: 8-22 mins                    G Codes:       Rica Koyanagi  PTA [facility]  Acute  Rehab Pager      [phone]

## 2017-05-22 NOTE — Progress Notes (Signed)
     Subjective: 2 Days Post-Op Procedure(s) (LRB): TOTAL KNEE BILATERAL (Bilateral)   Patient reports pain as moderate, pain controlled. No events throughout the night. Concerned a little about pain control.  Feels that he has done well with PT.   Objective:   VITALS:   Vitals:   05/21/17 2135 05/22/17 0507  BP:  130/79  Pulse:  62  Resp: 18 16  Temp:  97.7 F (36.5 C)  SpO2:  100%    Bilaterally: Dorsiflexion/Plantar flexion intact Incision: dressing C/D/I No cellulitis present Compartment soft  LABS Recent Labs    05/21/17 0529 05/22/17 0537  HGB 11.2* 11.5*  HCT 34.3* 36.0*  WBC 14.9* 12.3*  PLT 219 238    Recent Labs    05/21/17 0529 05/22/17 0537  NA 138 140  K 3.9 4.0  BUN 12 16  CREATININE 0.85 0.90  GLUCOSE 161* 108*     Assessment/Plan: 2 Days Post-Op Procedure(s) (LRB): TOTAL KNEE BILATERAL (Bilateral) Increase dosing of oxycodone Up with therapy Discharge home when ready, probably tomorrow  Obese (BMI 30-39.9) Estimated body mass index is 34.31 kg/m as calculated from the following:   Height as of this encounter: 6' (1.829 m).   Weight as of this encounter: 114.8 kg (253 lb). Patient also counseled that weight may inhibit the healing process Patient counseled that losing weight will help with future health issues      [place] Pugh. Wright Gravely   PAC  05/22/2017, 8:43 AM

## 2017-05-22 NOTE — Progress Notes (Addendum)
Discharge planning, spoke with patient at bedside. Have chosen [facility] for [facility] PT, evaluate and treat. Requesting Tressia Danas as therapist. [facility] for referral. Has a RW but needs a 3n1, contacted [facility] to deliver to room. Plans to purchase a shower chair. [phone]

## 2017-05-23 NOTE — Discharge Summary (Signed)
Physician Discharge Summary  Patient ID: [name] MRN: [id number] DOB/AGE: [birth year] 57 y.o.  Admit date: 05/20/2017 Discharge date:  05/23/2017  Procedures:  Procedure(s) (LRB): TOTAL KNEE BILATERAL (Bilateral)  Attending Physician:  Dr. [name]   Admission Diagnoses:   Bilateral knee primary OA / pain  Discharge Diagnoses:  Principal Problem:   S/P bilateral TKAs Active Problems:   Obese  Past Medical History:  Diagnosis Date  . Bilateral knee pain   . Depression    no current meds  . DJD (degenerative joint disease)   . First degree heart block   . GERD (gastroesophageal reflux disease)   . Hypertension   . OA (osteoarthritis) of knee    bilateral  . Obstructive sleep apnea   . Persistent atrial fibrillation ([place]) first dx 10/ 2015-- previous primary cardiologist, [name]   followed by atrial fib. clinic-- [name] NP    HPI:    Sean Morales, 57 y.o. male, has a history of pain and functional disability in bilateral knees due to arthritis and has failed non-surgical conservative treatments for greater than 12 weeks to include NSAID's and/or analgesics, corticosteriod injections, viscosupplementation injections, flexibility and strengthening excercises, weight reduction as appropriate and activity modification.  Onset of symptoms was gradual, starting >10 years ago with gradually worsening course since that time. The patient noted prior procedures on the knee to include  ORIF of the right knee.  Patient currently rates pain in the bilateral knees at 9 out of 10 with activity. Patient has night pain, worsening of pain with activity and weight bearing, pain that interferes with activities of daily living, pain with passive range of motion, crepitus and joint swelling.  Patient has evidence of periarticular osteophytes and joint space narrowing by imaging studies.  There is no active infection.Risks, benefits and expectations were discussed with the  patient.  Risks including but not limited to the risk of anesthesia, blood clots, nerve damage, blood vessel damage, failure of the prosthesis, infection and up to and including death.  Patient understand the risks, benefits and expectations and wishes to proceed with surgery.    PCP: Sharilyn Sites, MD   Discharged Condition: good  Hospital Course:  Patient underwent the above stated procedure on 05/20/2017. Patient tolerated the procedure well and brought to the recovery room in good condition and subsequently to the floor.  POD #1 BP: 117/62 ; Pulse: 63 ; Temp: 98.3 F (36.8 C) ; Resp: 16 Patient reports pain as moderate.  More early discomfort with his left knee but otherwise ok.  Up eating breakfast this am.  Ready to begin therapy. Few steps yesterday. Neurovascular intact and incision: dressing C/D/I  - bilateral.   LABS  Basename    HGB     11.2  HCT     34.3   POD #2  BP: 130/79 ; Pulse: 62 ; Temp: 97.7 F (36.5 C) ; Resp: 16 Patient reports pain as moderate, pain controlled. No events throughout the night. Concerned a little about pain control.  Feels that he has done well with PT.  Bilaterally:  Dorsiflexion/plantar flexion intact, incision: dressing C/D/I, no cellulitis present and compartment soft.   LABS  Basename    HGB     11.5  HCT     36.0   POD #3  BP: 131/72 ; Pulse: 77 ; Temp: 98.4 F (36.9 C) ; Resp: 17 Patient reports pain as moderate, just finished working with PT.  States it is  difficult with both knees being replaced.  Worked well with PT. Ready to be discharged home. Bilaterally:  Dorsiflexion/plantar flexion intact, incision: dressing C/D/I, no cellulitis present and compartment soft.   LABS   No new labs   Discharge Exam: General appearance: alert, cooperative and no distress Extremities: Homans sign is negative, no sign of DVT, no edema, redness or tenderness in the calves or thighs and no ulcers, gangrene or trophic changes  Disposition: Home  with follow up in 2 weeks   Follow-up Information    [name], MD. Schedule an appointment as soon as possible for a visit in 2 week(s).   Specialty:  Orthopedic Surgery Contact information: [address] [place] [address] [phone]        [facility] Follow up.   Specialty:  Home Health Services Why:  physical therapy Contact information: [address] [place] [address] [facility] Follow up.   Why:  3n1 Contact information: [address] [place] [address] [phone]           Discharge Instructions    Call MD / Call 911   Complete by:  As directed    If you experience chest pain or shortness of breath, CALL 911 and be transported to the hospital emergency room.  If you develope a fever above 101 F, pus (white drainage) or increased drainage or redness at the wound, or calf pain, call your surgeon's office.   Change dressing   Complete by:  As directed    Maintain surgical dressing until follow up in the clinic. If the edges start to pull up, may reinforce with tape. If the dressing is no longer working, may remove and cover with gauze and tape, but must keep the area dry and clean.  Call with any questions or concerns.   Constipation Prevention   Complete by:  As directed    Drink plenty of fluids.  Prune juice may be helpful.  You may use a stool softener, such as Colace (over the counter) 100 mg twice a day.  Use MiraLax (over the counter) for constipation as needed.   Diet - low sodium heart healthy   Complete by:  As directed    Discharge instructions   Complete by:  As directed    Maintain surgical dressing until follow up in the clinic. If the edges start to pull up, may reinforce with tape. If the dressing is no longer working, may remove and cover with gauze and tape, but must keep the area dry and clean.  Follow up in 2 weeks at [facility]. Call  with any questions or concerns.   Increase activity slowly as tolerated   Complete by:  As directed    Weight bearing as tolerated with assist device (walker, cane, etc) as directed, use it as long as suggested by your surgeon or therapist, typically at least 4-6 weeks.   TED hose   Complete by:  As directed    Use stockings (TED hose) for 2 weeks on both leg(s).  You may remove them at night for sleeping.      Allergies as of 05/23/2017      Reactions   Bee Venom Anaphylaxis   Japanese Hornet   Other    Copyronil per patient not sure   Penicillins Other (See Comments)   Unknown. Childhood allergy. Has patient had a PCN  reaction causing immediate rash, facial/tongue/throat swelling, SOB or lightheadedness with hypotension: Unknown Has patient had a PCN reaction causing severe rash involving mucus membranes or skin necrosis: Unknown Has patient had a PCN reaction that required hospitalization: Unknown Has patient had a PCN reaction occurring within the last 10 years: No If all of the above answers are "NO", then may proceed with Cephalosporin use.   Sulfa Antibiotics Other (See Comments)   Unknown. Childhood allergy.   Tetracyclines & Related Other (See Comments)   Unknown. Childhood allergy.      Medication List    TAKE these medications   amLODipine 10 MG tablet Commonly known as:  NORVASC Take 1 tablet (10 mg total) by mouth daily. What changed:  when to take this   docusate sodium 100 MG capsule Commonly known as:  COLACE Take 1 capsule (100 mg total) by mouth 2 (two) times daily.   dofetilide 500 MCG capsule Commonly known as:  TIKOSYN Take 1 capsule (500 mcg total) by mouth 2 (two) times daily.   ferrous sulfate 325 (65 FE) MG tablet Commonly known as:  FERROUSUL Take 1 tablet (325 mg total) by mouth 3 (three) times daily with meals.   losartan 100 MG tablet Commonly known as:  COZAAR Take 100 mg by mouth daily.   Magnesium 250 MG Tabs Take 250 mg by mouth  daily.   methocarbamol 500 MG tablet Commonly known as:  ROBAXIN Take 1 tablet (500 mg total) by mouth every 6 (six) hours as needed for muscle spasms.   metoprolol tartrate 25 MG tablet Commonly known as:  LOPRESSOR TAKE 1 TABLET (25 MG TOTAL) BY MOUTH 2 (TWO) TIMES DAILY.   omeprazole 40 MG capsule Commonly known as:  PRILOSEC Take 40 mg by mouth daily.   oxyCODONE 5 MG immediate release tablet Commonly known as:  Oxy IR/ROXICODONE Take 1-2 tablets (5-10 mg total) by mouth every 4 (four) hours as needed for moderate pain or severe pain.   polyethylene glycol packet Commonly known as:  MIRALAX / GLYCOLAX Take 17 g by mouth 2 (two) times daily.   rivaroxaban 20 MG Tabs tablet Commonly known as:  XARELTO TAKE 1 TABLET BY MOUTH EVERY DAY WITH DINNER What changed:    how much to take  how to take this  when to take this  additional instructions            Durable Medical Equipment  (From admission, onward)        Start     Ordered   05/22/17 1057  For home use only DME 3 n 1  Once     05/22/17 1056       Discharge Care Instructions  (From admission, onward)        Start     Ordered   05/22/17 0000  Change dressing    Comments:  Maintain surgical dressing until follow up in the clinic. If the edges start to pull up, may reinforce with tape. If the dressing is no longer working, may remove and cover with gauze and tape, but must keep the area dry and clean.  Call with any questions or concerns.   05/22/17 5784       Signed: West Pugh. Elizer Bostic   PA-C  05/23/2017, 12:39 PM

## 2017-05-23 NOTE — Progress Notes (Signed)
     Subjective: 3 Days Post-Op Procedure(s) (LRB): TOTAL KNEE BILATERAL (Bilateral)   Patient reports pain as moderate, just finished working with PT.  States it is difficult with both knees being replaced.  Worked well with PT. Ready to be discharged home.  Objective:   VITALS:   Vitals:   05/23/17 0538 05/23/17 0941  BP: 123/72 131/72  Pulse: 62 77  Resp: 17   Temp: 98.4 F (36.9 C)   SpO2: 100% 100%    Dorsiflexion/Plantar flexion intact Incision: dressing C/D/I No cellulitis present Compartment soft  LABS Recent Labs    05/21/17 0529 05/22/17 0537  HGB 11.2* 11.5*  HCT 34.3* 36.0*  WBC 14.9* 12.3*  PLT 219 238    Recent Labs    05/21/17 0529 05/22/17 0537  NA 138 140  K 3.9 4.0  BUN 12 16  CREATININE 0.85 0.90  GLUCOSE 161* 108*     Assessment/Plan: 3 Days Post-Op Procedure(s) (LRB): TOTAL KNEE BILATERAL (Bilateral) Up with therapy Discharge home with home health Follow up in 2 weeks at [facility]. Follow up with OLIN,Johnisha Louks D in 2 weeks.  Contact information:  [facility] [address] [place]   PAC  05/23/2017, 12:34 PM

## 2017-05-23 NOTE — Progress Notes (Signed)
Physical Therapy Treatment Patient Details Name: Sean Morales MRN: [id number] DOB: 25-Apr-1960 Today's Date: 05/23/2017    History of Present Illness bil TKA    PT Comments    POD # 3 Assisted with amb in hallway and practiced stairs with spouse.  Pt has 4 steps no rails to enter home.  Performed at Mod/Min assist with spouse "hands on" with difficulty as pt lack function knee flex.  Pt had to ABd each LE on step up and utilize walker for extra support to complete.  Pt c/o MAX fatigue after getting up 4 steps.  Assisted back to room in recliner.  Advised spouse they will need another person to assist for increased safety.  Pt plans to D/C later to home.   Follow Up Recommendations  Home health PT     Equipment Recommendations  None recommended by PT    Recommendations for Other Services       Precautions / Restrictions Precautions Precautions: Knee;Fall Restrictions Weight Bearing Restrictions: No Other Position/Activity Restrictions: WBAT    Mobility  Bed Mobility               General bed mobility comments: OOB in recliner  Transfers Overall transfer level: Needs assistance Equipment used: Rolling walker (2 wheeled) Transfers: Sit to/from Stand Sit to Stand: Supervision;Min guard         General transfer comment: increased time and VC's to "walk legs out" as pt present with limited knee flex  Ambulation/Gait Ambulation/Gait assistance: Supervision;Min guard Ambulation Distance (Feet): 22 Feet Assistive device: Rolling walker (2 wheeled) Gait Pattern/deviations: Step-to pattern;Decreased step length - right;Decreased step length - left Gait velocity: decreased   General Gait Details: decreased amb distance as Tx session was focused on stairs    Stairs Stairs: Yes   Stair Management: No rails;Step to pattern;Backwards;With walker Number of Stairs: 4 General stair comments: with spouse present, practiced up 4 steps backward with walker with  difficulty due to limited B knee flexion.    Wheelchair Mobility    Modified Rankin (Stroke Patients Only)       Balance                                            Cognition Arousal/Alertness: Awake/alert Behavior During Therapy: WFL for tasks assessed/performed Overall Cognitive Status: Within Functional Limits for tasks assessed                                        Exercises      General Comments        Pertinent Vitals/Pain Pain Assessment: 0-10 Pain Score: 7  Pain Location: bil knees  L > R Pain Descriptors / Indicators: Tightness;Operative site guarding Pain Intervention(s): Monitored during session;Repositioned;Premedicated before session;Ice applied    Home Living                      Prior Function            PT Goals (current goals can now be found in the care plan section)      Frequency    7X/week      PT Plan Current plan remains appropriate    Co-evaluation              AM-PAC  PT "6 Clicks" Daily Activity  Outcome Measure  Difficulty turning over in bed (including adjusting bedclothes, sheets and blankets)?: A Little Difficulty moving from lying on back to sitting on the side of the bed? : A Little Difficulty sitting down on and standing up from a chair with arms (e.g., wheelchair, bedside commode, etc,.)?: A Little Help needed moving to and from a bed to chair (including a wheelchair)?: A Little Help needed walking in hospital room?: A Little Help needed climbing 3-5 steps with a railing? : Total 6 Click Score: 16    End of Session Equipment Utilized During Treatment: Gait belt Activity Tolerance: Patient tolerated treatment well Patient left: in chair;with call bell/phone within reach Nurse Communication: Mobility status(pt ready for D/C to home) PT Visit Diagnosis: Unsteadiness on feet (R26.81);Pain Pain - Right/Left: Left Pain - part of body: Knee     Time: 1152-1225 PT  Time Calculation (min) (ACUTE ONLY): 33 min  Charges:  $Therapeutic Activity: 8-22 mins                    G Codes:       Rica Koyanagi  PTA [facility]  Acute  Rehab Pager      [phone]

## 2017-05-24 DIAGNOSIS — K219 Gastro-esophageal reflux disease without esophagitis: Secondary | ICD-10-CM | POA: Diagnosis not present

## 2017-05-24 DIAGNOSIS — Z96653 Presence of artificial knee joint, bilateral: Secondary | ICD-10-CM | POA: Diagnosis not present

## 2017-05-24 DIAGNOSIS — I481 Persistent atrial fibrillation: Secondary | ICD-10-CM | POA: Diagnosis not present

## 2017-05-24 DIAGNOSIS — Z79891 Long term (current) use of opiate analgesic: Secondary | ICD-10-CM | POA: Diagnosis not present

## 2017-05-24 DIAGNOSIS — F329 Major depressive disorder, single episode, unspecified: Secondary | ICD-10-CM | POA: Diagnosis not present

## 2017-05-24 DIAGNOSIS — Z471 Aftercare following joint replacement surgery: Secondary | ICD-10-CM | POA: Diagnosis not present

## 2017-05-24 DIAGNOSIS — G4733 Obstructive sleep apnea (adult) (pediatric): Secondary | ICD-10-CM | POA: Diagnosis not present

## 2017-05-24 DIAGNOSIS — I10 Essential (primary) hypertension: Secondary | ICD-10-CM | POA: Diagnosis not present

## 2017-05-24 DIAGNOSIS — Z7901 Long term (current) use of anticoagulants: Secondary | ICD-10-CM | POA: Diagnosis not present

## 2017-05-25 DIAGNOSIS — Z471 Aftercare following joint replacement surgery: Secondary | ICD-10-CM | POA: Diagnosis not present

## 2017-05-25 DIAGNOSIS — Z79891 Long term (current) use of opiate analgesic: Secondary | ICD-10-CM | POA: Diagnosis not present

## 2017-05-25 DIAGNOSIS — Z96653 Presence of artificial knee joint, bilateral: Secondary | ICD-10-CM | POA: Diagnosis not present

## 2017-05-25 DIAGNOSIS — I10 Essential (primary) hypertension: Secondary | ICD-10-CM | POA: Diagnosis not present

## 2017-05-25 DIAGNOSIS — F329 Major depressive disorder, single episode, unspecified: Secondary | ICD-10-CM | POA: Diagnosis not present

## 2017-05-25 DIAGNOSIS — Z7901 Long term (current) use of anticoagulants: Secondary | ICD-10-CM | POA: Diagnosis not present

## 2017-05-25 DIAGNOSIS — G4733 Obstructive sleep apnea (adult) (pediatric): Secondary | ICD-10-CM | POA: Diagnosis not present

## 2017-05-25 DIAGNOSIS — I481 Persistent atrial fibrillation: Secondary | ICD-10-CM | POA: Diagnosis not present

## 2017-05-25 DIAGNOSIS — K219 Gastro-esophageal reflux disease without esophagitis: Secondary | ICD-10-CM | POA: Diagnosis not present

## 2017-05-27 DIAGNOSIS — Z79891 Long term (current) use of opiate analgesic: Secondary | ICD-10-CM | POA: Diagnosis not present

## 2017-05-27 DIAGNOSIS — I481 Persistent atrial fibrillation: Secondary | ICD-10-CM | POA: Diagnosis not present

## 2017-05-27 DIAGNOSIS — Z7901 Long term (current) use of anticoagulants: Secondary | ICD-10-CM | POA: Diagnosis not present

## 2017-05-27 DIAGNOSIS — F329 Major depressive disorder, single episode, unspecified: Secondary | ICD-10-CM | POA: Diagnosis not present

## 2017-05-27 DIAGNOSIS — Z96653 Presence of artificial knee joint, bilateral: Secondary | ICD-10-CM | POA: Diagnosis not present

## 2017-05-27 DIAGNOSIS — G4733 Obstructive sleep apnea (adult) (pediatric): Secondary | ICD-10-CM | POA: Diagnosis not present

## 2017-05-27 DIAGNOSIS — I10 Essential (primary) hypertension: Secondary | ICD-10-CM | POA: Diagnosis not present

## 2017-05-27 DIAGNOSIS — Z471 Aftercare following joint replacement surgery: Secondary | ICD-10-CM | POA: Diagnosis not present

## 2017-05-27 DIAGNOSIS — K219 Gastro-esophageal reflux disease without esophagitis: Secondary | ICD-10-CM | POA: Diagnosis not present

## 2017-05-29 DIAGNOSIS — Z96653 Presence of artificial knee joint, bilateral: Secondary | ICD-10-CM | POA: Diagnosis not present

## 2017-05-29 DIAGNOSIS — I481 Persistent atrial fibrillation: Secondary | ICD-10-CM | POA: Diagnosis not present

## 2017-05-29 DIAGNOSIS — F329 Major depressive disorder, single episode, unspecified: Secondary | ICD-10-CM | POA: Diagnosis not present

## 2017-05-29 DIAGNOSIS — Z7901 Long term (current) use of anticoagulants: Secondary | ICD-10-CM | POA: Diagnosis not present

## 2017-05-29 DIAGNOSIS — Z471 Aftercare following joint replacement surgery: Secondary | ICD-10-CM | POA: Diagnosis not present

## 2017-05-29 DIAGNOSIS — K219 Gastro-esophageal reflux disease without esophagitis: Secondary | ICD-10-CM | POA: Diagnosis not present

## 2017-05-29 DIAGNOSIS — I10 Essential (primary) hypertension: Secondary | ICD-10-CM | POA: Diagnosis not present

## 2017-05-29 DIAGNOSIS — Z79891 Long term (current) use of opiate analgesic: Secondary | ICD-10-CM | POA: Diagnosis not present

## 2017-05-29 DIAGNOSIS — G4733 Obstructive sleep apnea (adult) (pediatric): Secondary | ICD-10-CM | POA: Diagnosis not present

## 2017-05-31 DIAGNOSIS — F329 Major depressive disorder, single episode, unspecified: Secondary | ICD-10-CM | POA: Diagnosis not present

## 2017-05-31 DIAGNOSIS — Z471 Aftercare following joint replacement surgery: Secondary | ICD-10-CM | POA: Diagnosis not present

## 2017-05-31 DIAGNOSIS — I481 Persistent atrial fibrillation: Secondary | ICD-10-CM | POA: Diagnosis not present

## 2017-05-31 DIAGNOSIS — G4733 Obstructive sleep apnea (adult) (pediatric): Secondary | ICD-10-CM | POA: Diagnosis not present

## 2017-05-31 DIAGNOSIS — K219 Gastro-esophageal reflux disease without esophagitis: Secondary | ICD-10-CM | POA: Diagnosis not present

## 2017-05-31 DIAGNOSIS — Z96653 Presence of artificial knee joint, bilateral: Secondary | ICD-10-CM | POA: Diagnosis not present

## 2017-05-31 DIAGNOSIS — Z7901 Long term (current) use of anticoagulants: Secondary | ICD-10-CM | POA: Diagnosis not present

## 2017-05-31 DIAGNOSIS — Z79891 Long term (current) use of opiate analgesic: Secondary | ICD-10-CM | POA: Diagnosis not present

## 2017-05-31 DIAGNOSIS — I10 Essential (primary) hypertension: Secondary | ICD-10-CM | POA: Diagnosis not present

## 2017-06-03 ENCOUNTER — Ambulatory Visit: Payer: BLUE CROSS/BLUE SHIELD | Admitting: Nurse Practitioner

## 2017-06-03 DIAGNOSIS — Z96653 Presence of artificial knee joint, bilateral: Secondary | ICD-10-CM | POA: Diagnosis not present

## 2017-06-03 DIAGNOSIS — Z79891 Long term (current) use of opiate analgesic: Secondary | ICD-10-CM | POA: Diagnosis not present

## 2017-06-03 DIAGNOSIS — F329 Major depressive disorder, single episode, unspecified: Secondary | ICD-10-CM | POA: Diagnosis not present

## 2017-06-03 DIAGNOSIS — Z7901 Long term (current) use of anticoagulants: Secondary | ICD-10-CM | POA: Diagnosis not present

## 2017-06-03 DIAGNOSIS — G4733 Obstructive sleep apnea (adult) (pediatric): Secondary | ICD-10-CM | POA: Diagnosis not present

## 2017-06-03 DIAGNOSIS — I10 Essential (primary) hypertension: Secondary | ICD-10-CM | POA: Diagnosis not present

## 2017-06-03 DIAGNOSIS — Z471 Aftercare following joint replacement surgery: Secondary | ICD-10-CM | POA: Diagnosis not present

## 2017-06-03 DIAGNOSIS — K219 Gastro-esophageal reflux disease without esophagitis: Secondary | ICD-10-CM | POA: Diagnosis not present

## 2017-06-03 DIAGNOSIS — I481 Persistent atrial fibrillation: Secondary | ICD-10-CM | POA: Diagnosis not present

## 2017-06-05 DIAGNOSIS — F329 Major depressive disorder, single episode, unspecified: Secondary | ICD-10-CM | POA: Diagnosis not present

## 2017-06-05 DIAGNOSIS — G4733 Obstructive sleep apnea (adult) (pediatric): Secondary | ICD-10-CM | POA: Diagnosis not present

## 2017-06-05 DIAGNOSIS — M1712 Unilateral primary osteoarthritis, left knee: Secondary | ICD-10-CM | POA: Diagnosis not present

## 2017-06-05 DIAGNOSIS — Z7901 Long term (current) use of anticoagulants: Secondary | ICD-10-CM | POA: Diagnosis not present

## 2017-06-05 DIAGNOSIS — Z79891 Long term (current) use of opiate analgesic: Secondary | ICD-10-CM | POA: Diagnosis not present

## 2017-06-05 DIAGNOSIS — Z471 Aftercare following joint replacement surgery: Secondary | ICD-10-CM | POA: Diagnosis not present

## 2017-06-05 DIAGNOSIS — Z96653 Presence of artificial knee joint, bilateral: Secondary | ICD-10-CM | POA: Diagnosis not present

## 2017-06-05 DIAGNOSIS — I481 Persistent atrial fibrillation: Secondary | ICD-10-CM | POA: Diagnosis not present

## 2017-06-05 DIAGNOSIS — I10 Essential (primary) hypertension: Secondary | ICD-10-CM | POA: Diagnosis not present

## 2017-06-05 DIAGNOSIS — K219 Gastro-esophageal reflux disease without esophagitis: Secondary | ICD-10-CM | POA: Diagnosis not present

## 2017-06-07 DIAGNOSIS — K219 Gastro-esophageal reflux disease without esophagitis: Secondary | ICD-10-CM | POA: Diagnosis not present

## 2017-06-07 DIAGNOSIS — I481 Persistent atrial fibrillation: Secondary | ICD-10-CM | POA: Diagnosis not present

## 2017-06-07 DIAGNOSIS — Z79891 Long term (current) use of opiate analgesic: Secondary | ICD-10-CM | POA: Diagnosis not present

## 2017-06-07 DIAGNOSIS — G4733 Obstructive sleep apnea (adult) (pediatric): Secondary | ICD-10-CM | POA: Diagnosis not present

## 2017-06-07 DIAGNOSIS — I10 Essential (primary) hypertension: Secondary | ICD-10-CM | POA: Diagnosis not present

## 2017-06-07 DIAGNOSIS — F329 Major depressive disorder, single episode, unspecified: Secondary | ICD-10-CM | POA: Diagnosis not present

## 2017-06-07 DIAGNOSIS — Z96653 Presence of artificial knee joint, bilateral: Secondary | ICD-10-CM | POA: Diagnosis not present

## 2017-06-07 DIAGNOSIS — Z471 Aftercare following joint replacement surgery: Secondary | ICD-10-CM | POA: Diagnosis not present

## 2017-06-07 DIAGNOSIS — Z7901 Long term (current) use of anticoagulants: Secondary | ICD-10-CM | POA: Diagnosis not present

## 2017-06-12 ENCOUNTER — Ambulatory Visit (HOSPITAL_COMMUNITY): Payer: BLUE CROSS/BLUE SHIELD | Admitting: Physical Therapy

## 2017-06-12 ENCOUNTER — Telehealth (HOSPITAL_COMMUNITY): Payer: Self-pay | Admitting: Family Medicine

## 2017-06-12 ENCOUNTER — Encounter (HOSPITAL_COMMUNITY): Payer: Self-pay

## 2017-06-12 NOTE — Telephone Encounter (Signed)
313/19  wife left a messge that he has been up all night sick

## 2017-06-14 ENCOUNTER — Ambulatory Visit (HOSPITAL_COMMUNITY): Payer: BLUE CROSS/BLUE SHIELD | Attending: Orthopedic Surgery | Admitting: Physical Therapy

## 2017-06-14 ENCOUNTER — Other Ambulatory Visit: Payer: Self-pay

## 2017-06-14 ENCOUNTER — Encounter (HOSPITAL_COMMUNITY): Payer: Self-pay | Admitting: Physical Therapy

## 2017-06-14 DIAGNOSIS — M545 Low back pain: Secondary | ICD-10-CM | POA: Diagnosis not present

## 2017-06-14 DIAGNOSIS — M25561 Pain in right knee: Secondary | ICD-10-CM | POA: Insufficient documentation

## 2017-06-14 DIAGNOSIS — M25562 Pain in left knee: Secondary | ICD-10-CM | POA: Diagnosis not present

## 2017-06-14 DIAGNOSIS — R262 Difficulty in walking, not elsewhere classified: Secondary | ICD-10-CM | POA: Insufficient documentation

## 2017-06-14 NOTE — Patient Instructions (Signed)
Knee Extension (Sitting)    Place _0___ pound weight on right  ankle and straighten knee fully, lower slowly. Repeat _10___ times per set. Do ___1_ sets per session. Do __2__ sessions per day.  http://orth.exer.us/732   Copyright  VHI. All rights reserved.  Heel Raise: Bilateral (Standing)    Rise on balls of feet. Repeat 10____ times per set. Do __1__ sets per session. Do _2___ sessions per day.  http://orth.exer.us/38   Copyright  VHI. All rights reserved.  Strengthening: Quadriceps Set    Tighten muscles on top of thighs by pushing knees down into surface. Hold __5__ seconds. Repeat _10___ times per set. Do ___1_ sets per session. Do __2__ sessions per day.  http://orth.exer.us/602   Copyright  VHI. All rights reserved.  Self-Mobilization: Heel Slide (Supine)    Slide left heel toward buttocks until a gentle stretch is felt. Hold _5___ seconds. Relax. Repeat with right Repeat _10___ times per set. Do _1___ sets per session. Do _2___ sessions per day.  http://orth.exer.us/710   Copyright  VHI. All rights reserved.

## 2017-06-14 NOTE — Therapy (Signed)
[facility] [address] [place], [place], [address] Phone: [phone]   Fax:  [phone]  Physical Therapy Evaluation  Patient Details  Name: Sean Morales MRN: [id number] Date of Birth: 10-30-1960 Referring Provider: [name]    Encounter Date: 06/14/2017  PT End of Session - 06/14/17 1209    Visit Number  1    Number of Visits  12    Date for PT Re-Evaluation  07/14/17 mini reassess at 3/29    PT Start Time  1120    PT Stop Time  1207    PT Time Calculation (min)  47 min    Equipment Utilized During Treatment  Gait belt    Activity Tolerance  Patient tolerated treatment well    Behavior During Therapy  [facility] for tasks assessed/performed       Past Medical History:  Diagnosis Date  . Bilateral knee pain   . Depression    no current meds  . DJD (degenerative joint disease)   . First degree heart block   . GERD (gastroesophageal reflux disease)   . Hypertension   . OA (osteoarthritis) of knee    bilateral  . Obstructive sleep apnea   . Persistent atrial fibrillation ([place]) first dx 10/ 2015-- previous primary cardiologist, [name]   followed by atrial fib. clinic-- [name] NP    Past Surgical History:  Procedure Laterality Date  . APPENDECTOMY  age 68  . CARDIOVASCULAR STRESS TEST  01-17-2015  [name]   Low risk nuclear study w/ no ischemia/  normal LV function and wal motion,  nuclear stress ef 60%  . CARDIOVERSION N/A 01/20/2015   Procedure: CARDIOVERSION;  Surgeon: [name], MD;  Location: [facility];  Service: Cardiovascular;  Laterality: N/A;  . colonscopy  age 9  . cysts removed from nipples  in high school   and neck  . ORIF RIGHT KNEE  age 58  . TONSILLECTOMY  age 74  . TOTAL KNEE ARTHROPLASTY Bilateral 05/20/2017   Procedure: TOTAL KNEE BILATERAL;  Surgeon: [name], MD;  Location: [facility];  Service: Orthopedics;  Laterality: Bilateral;  Bilateral Adductor Block  .  TRANSTHORACIC ECHOCARDIOGRAM  03/29/2017   ef 60-65%/  trivial AR, MR, and TR/  mild LAE/ moderate RAE/  PASP 65mmHg    There were no vitals filed for this visit.   Subjective Assessment - 06/14/17 1122    Subjective  [name] opted to have B TKR on 2/21 he was discharged to [facility] and is now being referred to skilled out patient services.  He states that his main goal is to walk with confidence without an assistive device.      Pertinent History  OA    How long can you sit comfortably?  30 minutes     How long can you stand comfortably?  15 minutes     How long can you walk comfortably?  uses a walker and a cane.   So far the greatest that he has walked has been five minutes at a time.     Patient Stated Goals  To walk with confidence     Currently in Pain?  Yes    Pain Score  1  greatest pain 5/10     Pain Location  Knee    Pain Orientation  Right    Pain Descriptors / Indicators  Tightness    Pain Type  Acute pain    Pain Onset  1  to 4 weeks ago    Pain Frequency  Intermittent    Aggravating Factors   activity     Pain Relieving Factors  ice and meds     Effect of Pain on Daily Activities  limits     Multiple Pain Sites  Yes    Pain Score  1 max is a 5/10     Pain Location  Knee    Pain Orientation  Left    Pain Descriptors / Indicators  Tightness    Pain Type  Acute pain    Pain Onset  1 to 4 weeks ago    Pain Frequency  Intermittent    Aggravating Factors   activity     Pain Relieving Factors  ice, medication     Effect of Pain on Daily Activities  limits          [facility] PT Assessment - 06/14/17 0001      Assessment   Medical Diagnosis  B TKR    Referring Provider  [name]     Onset Date/Surgical Date  05/20/17    Next MD Visit  07/03/2017    Prior Therapy  hh      Precautions   Precautions  None      Restrictions   Weight Bearing Restrictions  No      Balance Screen   Has the patient fallen in the past 6 months  No    Has the patient had a decrease in  activity level because of a fear of falling?   Yes    Is the patient reluctant to leave their home because of a fear of falling?   No      Prior Function   Level of Independence  Independent    Vocation  Full time employment    Music therapist:  sit with minimal walking     Leisure  get back to the gym       Cognition   Overall Cognitive Status  Within Functional Limits for tasks assessed      Observation/Other Assessments   Focus on Therapeutic Outcomes (FOTO)   34      Functional Tests   Functional tests  Single leg stance;Sit to Stand      Single Leg Stance   Comments  LT  24; RT 21      Sit to Stand   Comments  5x 35.92       ROM / Strength   AROM / PROM / Strength  AROM;Strength      AROM   AROM Assessment Site  Knee    Right/Left Knee  Right;Left    Right Knee Extension  7    Right Knee Flexion  117    Left Knee Extension  5    Left Knee Flexion  120      Strength   Strength Assessment Site  Hip;Knee;Ankle    Right/Left Hip  Right;Left    Right Hip Flexion  5/5    Right Hip Extension  4/5    Right Hip ABduction  5/5    Left Hip Flexion  5/5    Left Hip Extension  4/5    Left Hip ABduction  5/5    Right/Left Knee  Right;Left    Right Knee Flexion  4/5    Right Knee Extension  3+/5    Left Knee Flexion  4/5    Left Knee Extension  5/5    Right/Left Ankle  Right;Left    Right Ankle Dorsiflexion  5/5    Left Ankle Dorsiflexion  5/5      Ambulation/Gait   Ambulation Distance (Feet)  228 Feet    Assistive device  Straight cane             Objective measurements completed on examination: See above findings.      [facility] Adult PT Treatment/Exercise - 06/14/17 0001      Exercises   Exercises  Knee/Hip      Knee/Hip Exercises: Standing   Heel Raises  Both;10 reps    SLS  B x 20"      Knee/Hip Exercises: Seated   Long Arc Quad  Right;10 reps      Knee/Hip Exercises: Supine   Quad Sets  Both;10 reps    Heel Slides  Both;5  reps             PT Education - 06/14/17 1208    Education provided  Yes    Education Details  HEp and to increase walking     Person(s) Educated  Patient    Methods  Explanation    Comprehension  Verbalized understanding       PT Short Term Goals - 06/14/17 1217      PT SHORT TERM GOAL #1   Title  Pt knee extension in B knees to be 3 or less degrees to allow heel toe gait pattern.     Time  2    Period  Weeks    Status  New    Target Date  06/28/17      PT SHORT TERM GOAL #2   Title  PT to be walking inside his home without an assistive device     Time  2    Period  Weeks    Status  New      PT SHORT TERM GOAL #3   Title  PT knee flexion to be 125 B to allow pt to sit for an hour without increased pain for comfort when eating in a restaurant and traveling     Time  2    Period  Weeks    Status  New      PT SHORT TERM GOAL #4   Title  PT to be walking as an exercise at home and to have worked up to walking for 15 mintues without resting     Time  2    Period  Weeks        PT Long Term Goals - 06/14/17 1219      PT LONG TERM GOAL #1   Title  PT LE strength to be increased one grade to allowpt to go up and down steps in a reciprocal manner.     Time  4    Period  Weeks    Status  New    Target Date  07/12/17      PT LONG TERM GOAL #2   Title  Pt pain level to be no greater than 2/10 in either knee to allow pt to sleep for 6 hours without waking.     Time  4    Period  Weeks    Status  New      PT LONG TERM GOAL #3   Title  PT to be walking without any assistive device.     Time  4    Period  Weeks    Status  New      PT  LONG TERM GOAL #4   Title  PT to be able to walk 600 ft in a 3 minute time period for improved cadance for community ambulation.     Time  4    Period  Weeks    Status  New      PT LONG TERM GOAL #5   Title  Pt to have returned to the gym to work out.     Time  4    Period  Weeks    Status  New             Plan -  06/14/17 1211    Clinical Impression Statement  Mr. Kurtzman is a 57 yo male who had B TKR on 05/20/2017.  He has had home health and is now being referred to skilled out patient therapy to maximize his functional ability.  Evaluation demonstrates abnormal gait, decrased activity tolerance, decreased balance, decreased ROM, decreasee strength, difficulty in walking and increased pain.  Mr. Ybarra will benefit from skilled physical services to address these issues and return him to his prior level of functionig to include full time employment and returning to the gym..     Clinical Presentation  Stable    Clinical Decision Making  Moderate    Rehab Potential  Good    PT Frequency  3x / week    PT Duration  4 weeks    PT Treatment/Interventions  ADLs/Self Care Home Management;Therapeutic exercise;Balance training;Manual techniques;Patient/family education;Stair training;Gait training;Therapeutic activities    PT Next Visit Plan  Gait training to improve heel strike and velocity, gentle PROM to improve B knee extension, begin standing strengthening and balance activities to improve activity tolerance.     PT Home Exercise Plan  Eval:  Rt LAQ, heel raises, SLS, quad set and heelslides.     Consulted and Agree with Plan of Care  Patient;Family member/caregiver       Patient will benefit from skilled therapeutic intervention in order to improve the following deficits and impairments:  Abnormal gait, Decreased activity tolerance, Decreased balance, Decreased range of motion, Decreased mobility, Decreased strength, Difficulty walking, Increased edema, Pain  Visit Diagnosis: Acute pain of right knee  Acute pain of left knee  Difficulty in walking, not elsewhere classified     Problem List Patient Active Problem List   Diagnosis Date Noted  . Obese 05/22/2017  . S/P bilateral TKAs 05/20/2017  . Heart block AV second degree-nocturnal 06/11/2015  . Tobacco use 01/25/2014  . OSA on CPAP 01/25/2014   . Chest pain 01/09/2014  . Atrial fibrillation ([place]) 01/09/2014  . HTN (hypertension) 01/09/2014  . GERD (gastroesophageal reflux disease) 01/09/2014    Rayetta Humphrey, PT CLT [phone] 06/14/2017, 12:23 PM  [facility] [place], [place], [address] Phone: [phone]   Fax:  [phone]  Name: Sean Morales MRN: [id number] Date of Birth: [birth year]

## 2017-06-17 ENCOUNTER — Encounter (HOSPITAL_COMMUNITY): Payer: Self-pay | Admitting: Physical Therapy

## 2017-06-17 ENCOUNTER — Ambulatory Visit (HOSPITAL_COMMUNITY): Payer: BLUE CROSS/BLUE SHIELD | Admitting: Physical Therapy

## 2017-06-17 DIAGNOSIS — R262 Difficulty in walking, not elsewhere classified: Secondary | ICD-10-CM

## 2017-06-17 DIAGNOSIS — M25561 Pain in right knee: Secondary | ICD-10-CM

## 2017-06-17 DIAGNOSIS — M25562 Pain in left knee: Secondary | ICD-10-CM | POA: Diagnosis not present

## 2017-06-17 DIAGNOSIS — M545 Low back pain: Secondary | ICD-10-CM | POA: Diagnosis not present

## 2017-06-17 NOTE — Therapy (Signed)
[facility] [place], [place], [address] Phone: [phone]   Fax:  [phone]  Physical Therapy Treatment  Patient Details  Name: Sean Morales MRN: [id number] Date of Birth: 10/15/1960 Referring Provider: [name]    Encounter Date: 06/17/2017  PT End of Session - 06/17/17 0839    Visit Number  2    Number of Visits  12    Date for PT Re-Evaluation  07/14/17 mini reassess at 3/29    PT Start Time  0815    PT Stop Time  0900    PT Time Calculation (min)  45 min    Equipment Utilized During Treatment  Gait belt    Activity Tolerance  Patient tolerated treatment well    Behavior During Therapy  [facility] for tasks assessed/performed       Past Medical History:  Diagnosis Date  . Bilateral knee pain   . Depression    no current meds  . DJD (degenerative joint disease)   . First degree heart block   . GERD (gastroesophageal reflux disease)   . Hypertension   . OA (osteoarthritis) of knee    bilateral  . Obstructive sleep apnea   . Persistent atrial fibrillation ([place]) first dx 10/ 2015-- previous primary cardiologist, [name]   followed by atrial fib. clinic-- [name] NP    Past Surgical History:  Procedure Laterality Date  . APPENDECTOMY  age 54  . CARDIOVASCULAR STRESS TEST  01-17-2015  [name]   Low risk nuclear study w/ no ischemia/  normal LV function and wal motion,  nuclear stress ef 60%  . CARDIOVERSION N/A 01/20/2015   Procedure: CARDIOVERSION;  Surgeon: [name], MD;  Location: [facility];  Service: Cardiovascular;  Laterality: N/A;  . colonscopy  age 63  . cysts removed from nipples  in high school   and neck  . ORIF RIGHT KNEE  age 29  . TONSILLECTOMY  age 76  . TOTAL KNEE ARTHROPLASTY Bilateral 05/20/2017   Procedure: TOTAL KNEE BILATERAL;  Surgeon: [name], MD;  Location: [facility];  Service: Orthopedics;  Laterality: Bilateral;  Bilateral Adductor Block  .  TRANSTHORACIC ECHOCARDIOGRAM  03/29/2017   ef 60-65%/  trivial AR, MR, and TR/  mild LAE/ moderate RAE/  PASP 56mmHg    There were no vitals filed for this visit.  Subjective Assessment - 06/17/17 0816    Subjective  PT states that he was very active over the weekend, he even got on he riding lawn mower.     Pertinent History  OA    How long can you sit comfortably?  30 minutes     How long can you stand comfortably?  15 minutes     How long can you walk comfortably?  uses a walker and a cane.   So far the greatest that he has walked has been five minutes at a time.     Patient Stated Goals  To walk with confidence     Currently in Pain?  Yes    Pain Score  2     Pain Location  Knee    Pain Orientation  Right;Left    Pain Descriptors / Indicators  Aching    Pain Onset  1 to 4 weeks ago    Pain Onset  1 to 4 weeks ago  [facility] Adult PT Treatment/Exercise - 06/17/17 0001      Ambulation/Gait   Ambulation Distance (Feet)  226 Feet    Assistive device  Straight cane      Exercises   Exercises  Knee/Hip      Knee/Hip Exercises: Stretches   Knee: Self-Stretch to increase Flexion  Both;3 reps;30 seconds    Other Knee/Hip Stretches  slant board 3x 30"       Knee/Hip Exercises: Standing   Heel Raises  Both;10 reps    Knee Flexion  Both;2 sets;5 reps    Forward Step Up  Both;10 reps;Step Height: 4"    SLS  B x 30" x 3      Knee/Hip Exercises: Supine   Quad Sets  Both;10 reps    Heel Slides  --    Bridges  --    Other Supine Knee/Hip Exercises  PROM for extension                PT Short Term Goals - 06/17/17 0851      PT SHORT TERM GOAL #1   Title  Pt knee extension in B knees to be 3 or less degrees to allow heel toe gait pattern.     Time  2    Period  Weeks    Status  On-going      PT SHORT TERM GOAL #2   Title  PT to be walking inside his home without an assistive device     Time  2    Period  Weeks    Status  On-going       PT SHORT TERM GOAL #3   Title  PT knee flexion to be 125 B to allow pt to sit for an hour without increased pain for comfort when eating in a restaurant and traveling     Time  2    Period  Weeks    Status  On-going      PT SHORT TERM GOAL #4   Title  PT to be walking as an exercise at home and to have worked up to walking for 15 mintues without resting     Time  2    Period  Weeks    Status  On-going        PT Long Term Goals - 06/17/17 0851      PT LONG TERM GOAL #1   Title  PT LE strength to be increased one grade to allowpt to go up and down steps in a reciprocal manner.     Time  4    Period  Weeks    Status  On-going      PT LONG TERM GOAL #2   Title  Pt pain level to be no greater than 2/10 in either knee to allow pt to sleep for 6 hours without waking.     Time  4    Period  Weeks    Status  On-going      PT LONG TERM GOAL #3   Title  PT to be walking without any assistive device.     Time  4    Period  Weeks    Status  On-going      PT LONG TERM GOAL #4   Title  PT to be able to walk 600 ft in a 3 minute time period for improved cadance for community ambulation.     Time  4    Period  Weeks  Status  On-going      PT LONG TERM GOAL #5   Title  Pt to have returned to the gym to work out.     Time  4    Period  Weeks    Status  On-going            Plan - 06/17/17 5701    Clinical Impression Statement  Evaluation and goals reviewed with patient.   Added weightbearing exercises to pt program with minimal difficulty.  PT Rt LE is weaker than his left at this time.    Rehab Potential  Good    PT Frequency  3x / week    PT Duration  4 weeks    PT Treatment/Interventions  ADLs/Self Care Home Management;Therapeutic exercise;Balance training;Manual techniques;Patient/family education;Stair training;Gait training;Therapeutic activities    PT Next Visit Plan  Begin sit to stand and tandem stance on foam  activity     PT Home Exercise Plan  Eval:  Rt LAQ,  heel raises, SLS, quad set and heelslides.     Consulted and Agree with Plan of Care  Patient;Family member/caregiver       Patient will benefit from skilled therapeutic intervention in order to improve the following deficits and impairments:  Abnormal gait, Decreased activity tolerance, Decreased balance, Decreased range of motion, Decreased mobility, Decreased strength, Difficulty walking, Increased edema, Pain  Visit Diagnosis: Acute pain of right knee  Acute pain of left knee  Difficulty in walking, not elsewhere classified     Problem List Patient Active Problem List   Diagnosis Date Noted  . Obese 05/22/2017  . S/P bilateral TKAs 05/20/2017  . Heart block AV second degree-nocturnal 06/11/2015  . Tobacco use 01/25/2014  . OSA on CPAP 01/25/2014  . Chest pain 01/09/2014  . Atrial fibrillation ([place]) 01/09/2014  . HTN (hypertension) 01/09/2014  . GERD (gastroesophageal reflux disease) 01/09/2014    Rayetta Humphrey, PT CLT [phone] 06/17/2017, 9:05 AM  [facility] [place], [place], [address] Phone: [phone]   Fax:  [phone]  Name: Sean Morales MRN: [id number] Date of Birth: 1960/11/28

## 2017-06-19 ENCOUNTER — Encounter (HOSPITAL_COMMUNITY): Payer: Self-pay

## 2017-06-19 ENCOUNTER — Ambulatory Visit (HOSPITAL_COMMUNITY): Payer: BLUE CROSS/BLUE SHIELD

## 2017-06-19 DIAGNOSIS — M25561 Pain in right knee: Secondary | ICD-10-CM | POA: Diagnosis not present

## 2017-06-19 DIAGNOSIS — M545 Low back pain: Secondary | ICD-10-CM | POA: Diagnosis not present

## 2017-06-19 DIAGNOSIS — R262 Difficulty in walking, not elsewhere classified: Secondary | ICD-10-CM

## 2017-06-19 DIAGNOSIS — M25562 Pain in left knee: Secondary | ICD-10-CM | POA: Diagnosis not present

## 2017-06-19 NOTE — Therapy (Signed)
[place], [place], [address] Phone: [phone]   Fax:  [phone]  Physical Therapy Treatment  Patient Details  Name: BLAS RICHES MRN: [id number] Date of Birth: 13-Nov-1960 Referring Provider: [name]    Encounter Date: 06/19/2017  PT End of Session - 06/19/17 1611    Visit Number  3    Number of Visits  12    Date for PT Re-Evaluation  07/14/17 mini-reassess 3/29    Authorization Type  BCBS    Authorization Time Period  Cert 8/41-6/60/63    PT Start Time  1605    PT Stop Time  1646    PT Time Calculation (min)  41 min    Activity Tolerance  Patient tolerated treatment well    Behavior During Therapy  [facility] for tasks assessed/performed       Past Medical History:  Diagnosis Date  . Bilateral knee pain   . Depression    no current meds  . DJD (degenerative joint disease)   . First degree heart block   . GERD (gastroesophageal reflux disease)   . Hypertension   . OA (osteoarthritis) of knee    bilateral  . Obstructive sleep apnea   . Persistent atrial fibrillation ([place]) first dx 10/ 2015-- previous primary cardiologist, [name]   followed by atrial fib. clinic-- [name] NP    Past Surgical History:  Procedure Laterality Date  . APPENDECTOMY  age 7  . CARDIOVASCULAR STRESS TEST  01-17-2015  [name]   Low risk nuclear study w/ no ischemia/  normal LV function and wal motion,  nuclear stress ef 60%  . CARDIOVERSION N/A 01/20/2015   Procedure: CARDIOVERSION;  Surgeon: [name], MD;  Location: [facility];  Service: Cardiovascular;  Laterality: N/A;  . colonscopy  age 85  . cysts removed from nipples  in high school   and neck  . ORIF RIGHT KNEE  age 9  . TONSILLECTOMY  age 53  . TOTAL KNEE ARTHROPLASTY Bilateral 05/20/2017   Procedure: TOTAL KNEE BILATERAL;  Surgeon: [name], MD;  Location: [facility];  Service: Orthopedics;  Laterality: Bilateral;  Bilateral  Adductor Block  . TRANSTHORACIC ECHOCARDIOGRAM  03/29/2017   ef 60-65%/  trivial AR, MR, and TR/  mild LAE/ moderate RAE/  PASP 37mmHg    There were no vitals filed for this visit.  Subjective Assessment - 06/19/17 1606    Subjective  Pt stated he drove to [facility] for the first time yesterday and felt a strain in Lt side of lower back with intermittent sharp pains, current pain scale 6/10 for LBP.  Knees are feeling good today, minimal pain.      Patient Stated Goals  To walk with confidence     Currently in Pain?  Yes    Pain Score  6     Pain Location  Back    Pain Orientation  Lower;Left    Pain Descriptors / Indicators  -- feels he strained Lt LBP followng long drive then stairs    Pain Type  Acute pain    Pain Onset  1 to 4 weeks ago    Pain Frequency  Intermittent    Aggravating Factors   activity    Pain Relieving Factors  ice and meds    Effect of Pain on Daily Activities  limits    Pain Score  1    Pain Location  Knee    Pain Orientation  Right;Left    Pain Descriptors / Indicators  Tightness;Sore    Pain Type  Acute pain    Pain Onset  1 to 4 weeks ago    Pain Frequency  Intermittent    Aggravating Factors   activity    Pain Relieving Factors  ice, medication    Effect of Pain on Daily Activities  limits                      [facility] Adult PT Treatment/Exercise - 06/19/17 0001      Bed Mobility   Bed Mobility  Sit to Supine    Sit to Supine  5: Supervision    Sit to Supine - Details (indicate cue type and reason)  Instructed bed mobility for pain control      Knee/Hip Exercises: Stretches   Other Knee/Hip Stretches  slant board 3x 30"       Knee/Hip Exercises: Standing   Heel Raises  Both;15 reps;Limitations    Heel Raises Limitations  Toe raises on incline to improve form    Knee Flexion  Both;10 reps    Lateral Step Up  Both;10 reps;Hand Hold: 1;Step Height: 4"    Forward Step Up  Both;10 reps;Step Height: 4"    SLS  Rt 57", Lt 52" max of 2     Other Standing Knee Exercises  Tandem stance on foam 1' each direction then UE flexion in tandem stance on foam 15x 3# dowel rod      Knee/Hip Exercises: Seated   Sit to Sand  5 reps;without UE support foam on chair for LBP       Knee/Hip Exercises: Supine   Quad Sets  Both;10 reps    Heel Slides  Both;5 reps    Knee Extension  AROM;Limitations    Knee Extension Limitations  BLE 5 degrees    Other Supine Knee/Hip Exercises  PROM for extension                PT Short Term Goals - 06/17/17 0851      PT SHORT TERM GOAL #1   Title  Pt knee extension in B knees to be 3 or less degrees to allow heel toe gait pattern.     Time  2    Period  Weeks    Status  On-going      PT SHORT TERM GOAL #2   Title  PT to be walking inside his home without an assistive device     Time  2    Period  Weeks    Status  On-going      PT SHORT TERM GOAL #3   Title  PT knee flexion to be 125 B to allow pt to sit for an hour without increased pain for comfort when eating in a restaurant and traveling     Time  2    Period  Weeks    Status  On-going      PT SHORT TERM GOAL #4   Title  PT to be walking as an exercise at home and to have worked up to walking for 15 mintues without resting     Time  2    Period  Weeks    Status  On-going        PT Long Term Goals - 06/17/17 0851      PT LONG TERM GOAL #1   Title  PT LE strength to be increased one grade to allowpt  to go up and down steps in a reciprocal manner.     Time  4    Period  Weeks    Status  On-going      PT LONG TERM GOAL #2   Title  Pt pain level to be no greater than 2/10 in either knee to allow pt to sleep for 6 hours without waking.     Time  4    Period  Weeks    Status  On-going      PT LONG TERM GOAL #3   Title  PT to be walking without any assistive device.     Time  4    Period  Weeks    Status  On-going      PT LONG TERM GOAL #4   Title  PT to be able to walk 600 ft in a 3 minute time period for improved  cadance for community ambulation.     Time  4    Period  Weeks    Status  On-going      PT LONG TERM GOAL #5   Title  Pt to have returned to the gym to work out.     Time  4    Period  Weeks    Status  On-going            Plan - 06/19/17 2016    Clinical Impression Statement  Progressed CKC for functional strengthening with additional sit to stands and lateral step up training for quad strengthening.  Added tandem stance on foam to address balance deficits.  Pt with most difficulty with forward/lateral step ups due to weakness.  No reports of increased pain.  Improved Bil knee extension to 5 degrees lacking.  Educated on proper bed mobility to assist with back and knee pain.      Rehab Potential  Good    PT Frequency  3x / week    PT Duration  4 weeks    PT Treatment/Interventions  ADLs/Self Care Home Management;Therapeutic exercise;Balance training;Manual techniques;Patient/family education;Stair training;Gait training;Therapeutic activities    PT Next Visit Plan  Sit to stand with no additional height next session.  Progress to squats and continue step training.      PT Home Exercise Plan  Eval:  Rt LAQ, heel raises, SLS, quad set and heelslides.        Patient will benefit from skilled therapeutic intervention in order to improve the following deficits and impairments:  Abnormal gait, Decreased activity tolerance, Decreased balance, Decreased range of motion, Decreased mobility, Decreased strength, Difficulty walking, Increased edema, Pain  Visit Diagnosis: Acute pain of right knee  Acute pain of left knee  Difficulty in walking, not elsewhere classified     Problem List Patient Active Problem List   Diagnosis Date Noted  . Obese 05/22/2017  . S/P bilateral TKAs 05/20/2017  . Heart block AV second degree-nocturnal 06/11/2015  . Tobacco use 01/25/2014  . OSA on CPAP 01/25/2014  . Chest pain 01/09/2014  . Atrial fibrillation ([place]) 01/09/2014  . HTN (hypertension)  01/09/2014  . GERD (gastroesophageal reflux disease) 01/09/2014   Ihor Austin, [place]; [place]  Aldona Lento 06/19/2017, 8:20 PM  [place], [place], [address] Phone: [phone]   Fax:  [phone]  Name: JAYQUAN BRADSHER MRN: [id number] Date of Birth: 02-23-1961

## 2017-06-20 DIAGNOSIS — M545 Low back pain: Secondary | ICD-10-CM | POA: Diagnosis not present

## 2017-06-21 ENCOUNTER — Encounter (HOSPITAL_COMMUNITY): Payer: Self-pay | Admitting: Physical Therapy

## 2017-06-21 ENCOUNTER — Ambulatory Visit (HOSPITAL_COMMUNITY): Payer: BLUE CROSS/BLUE SHIELD | Admitting: Physical Therapy

## 2017-06-21 DIAGNOSIS — M545 Low back pain, unspecified: Secondary | ICD-10-CM

## 2017-06-21 DIAGNOSIS — M25561 Pain in right knee: Secondary | ICD-10-CM

## 2017-06-21 DIAGNOSIS — M25562 Pain in left knee: Secondary | ICD-10-CM

## 2017-06-21 DIAGNOSIS — R262 Difficulty in walking, not elsewhere classified: Secondary | ICD-10-CM

## 2017-06-21 NOTE — Therapy (Signed)
[facility] [address] [place], [place], [address] Phone: [phone]   Fax:  [phone]  Physical Therapy Treatment  Patient Details  Name: Sean Morales MRN: [id number] Date of Birth: 1960/10/28 Referring Provider: Gerrit Halls    Encounter Date: 06/21/2017  PT End of Session - 06/21/17 1006    Visit Number  4    Number of Visits  15    Date for PT Re-Evaluation  07/19/17 mini-reassess 3/29    Authorization Type  BCBS    Authorization Time Period  06/21/17-4/19    PT Start Time  0815    PT Stop Time  0900    PT Time Calculation (min)  45 min    Activity Tolerance  Patient tolerated treatment well    Behavior During Therapy  [facility] for tasks assessed/performed       Past Medical History:  Diagnosis Date  . Bilateral knee pain   . Depression    no current meds  . DJD (degenerative joint disease)   . First degree heart block   . GERD (gastroesophageal reflux disease)   . Hypertension   . OA (osteoarthritis) of knee    bilateral  . Obstructive sleep apnea   . Persistent atrial fibrillation ([place]) first dx 10/ 2015-- previous primary cardiologist, [name]   followed by atrial fib. clinic-- [name] NP    Past Surgical History:  Procedure Laterality Date  . APPENDECTOMY  age 81  . CARDIOVASCULAR STRESS TEST  01-17-2015  [name]   Low risk nuclear study w/ no ischemia/  normal LV function and wal motion,  nuclear stress ef 60%  . CARDIOVERSION N/A 01/20/2015   Procedure: CARDIOVERSION;  Surgeon: [name], MD;  Location: [facility];  Service: Cardiovascular;  Laterality: N/A;  . colonscopy  age 15  . cysts removed from nipples  in high school   and neck  . ORIF RIGHT KNEE  age 9  . TONSILLECTOMY  age 87  . TOTAL KNEE ARTHROPLASTY Bilateral 05/20/2017   Procedure: TOTAL KNEE BILATERAL;  Surgeon: [name], MD;  Location: [facility];  Service: Orthopedics;  Laterality: Bilateral;  Bilateral Adductor  Block  . TRANSTHORACIC ECHOCARDIOGRAM  03/29/2017   ef 60-65%/  trivial AR, MR, and TR/  mild LAE/ moderate RAE/  PASP 73mmHg    There were no vitals filed for this visit.  Subjective Assessment - 06/21/17 0815    Subjective  Pt comes to the department with a new order for his back as well.   Mr. Dowson states that he has had  back pain that has flared up every now and then  for a couple of years.  He has that he never actually has been seen medically for his back  because the pain  would always go away in a couple of days.  His current  back pain started  on 06/18/16 and has never  been so severe.  The pain is not allowing him to complete his knee exercises and is affecting his sleep therefore he called his MD who requested that he be seen.  He now has orders for both lumbar and  knee rehab.        Pertinent History  OA    How long can you sit comfortably?  30 minutes     How long can you stand comfortably?  15 minutes     How long can you walk comfortably?  uses a walker and a  cane.   So far the greatest that he has walked has been five minutes at a time.     Patient Stated Goals  To walk with confidence     Currently in Pain?  Yes    Pain Score  3  worst:  8/10; best 3/10    Pain Location  Back    Pain Orientation  Left    Pain Descriptors / Indicators  Stabbing    Pain Onset  In the past 7 days    Pain Frequency  Constant    Aggravating Factors   not sure     Pain Relieving Factors  ice     Effect of Pain on Daily Activities  limits    Multiple Pain Sites  Yes    Pain Score  2    Pain Location  Knee    Pain Orientation  Right;Left    Pain Descriptors / Indicators  Tightness    Pain Onset  1 to 4 weeks ago    Pain Frequency  Intermittent    Aggravating Factors   activity     Pain Relieving Factors  ice     Effect of Pain on Daily Activities  limits          [facility] PT Assessment - 06/21/17 0001      Assessment   Medical Diagnosis  B TKR/lumbar pain     Referring Provider   Gerrit Halls     Onset Date/Surgical Date  05/20/17    Next MD Visit  07/03/2017    Prior Therapy  hh      Precautions   Precautions  None      Restrictions   Weight Bearing Restrictions  No      Balance Screen   Has the patient fallen in the past 6 months  No    Has the patient had a decrease in activity level because of a fear of falling?   Yes    Is the patient reluctant to leave their home because of a fear of falling?   No      Prior Function   Level of Independence  Independent    Vocation  Full time employment    Music therapist:  sit with minimal walking     Leisure  get back to the gym       Cognition   Overall Cognitive Status  Within Functional Limits for tasks assessed      Observation/Other Assessments   Focus on Therapeutic Outcomes (FOTO)   --      Functional Tests   Functional tests  --      Single Leg Stance   Comments  --      Sit to Stand   Comments  --      Posture/Postural Control   Posture/Postural Control  Postural limitations    Postural Limitations  Rounded Shoulders;Increased thoracic kyphosis      ROM / Strength   AROM / PROM / Strength  AROM      AROM   AROM Assessment Site  Lumbar    Right Knee Extension  5    Right Knee Flexion  119    Left Knee Extension  5    Left Knee Flexion  120    Lumbar Flexion  fingers to knees  going down bothers pt more than coming up     Lumbar Extension  15 reps no change     Lumbar - Right Side Bend  20 no change     Lumbar - Left Side Bend  14 reps aggrevate       Strength   Right Hip Flexion  --    Right Hip Extension  --    Right Hip ABduction  --    Left Hip Flexion  --    Left Hip Extension  --    Left Hip ABduction  --    Right Knee Flexion  --    Right Knee Extension  --    Left Knee Flexion  --    Left Knee Extension  --    Right Ankle Dorsiflexion  --    Left Ankle Dorsiflexion  --      Flexibility   Soft Tissue Assessment /Muscle Length  yes    Hamstrings  Rt  130; Lt 150       Palpation   Palpation comment  moderate mm spasm noted in Lt quadratus lumborum       Bed Mobility   Bed Mobility  Sit to Supine    Sit to Supine  5: Supervision                  [facility] Adult PT Treatment/Exercise - 06/21/17 0001      Ambulation/Gait   Ambulation Distance (Feet)  --    Assistive device  --      Exercises   Exercises  Knee/Hip      Knee/Hip Exercises: Stretches   Active Hamstring Stretch  Both;2 reps;30 seconds    Other Knee/Hip Stretches  Back extension and SB x 5 each ; knee to chest B x 30" x 2; LTR x 5      Knee/Hip Exercises: Supine   Other Supine Knee/Hip Exercises  ab/ glut set x  5 reps each       Knee/Hip Exercises: Prone   Hamstring Curl  10 reps    Hamstring Curl Limitations  B    Prone Knee Hang  3 minutes      Manual Therapy   Manual Therapy  Soft tissue mobilization    Manual therapy comments  seperate from all other aspects of treatment     Soft tissue mobilization  deep soft tissue mob to Lt quadratus lumborum              PT Education - 06/21/17 1006    Education provided  Yes    Education Details  bed mobility, HEP for lumbar flexibility     Person(s) Educated  Patient    Methods  Explanation;Handout    Comprehension  Returned demonstration       PT Short Term Goals - 06/21/17 1017      PT SHORT TERM GOAL #1   Title  Pt knee extension in B knees to be 3 or less degrees to allow heel toe gait pattern.     Time  2    Period  Weeks    Status  On-going      PT SHORT TERM GOAL #2   Title  PT to be walking inside his home without an assistive device     Time  2    Period  Weeks    Status  On-going      PT SHORT TERM GOAL #3   Title  PT knee flexion to be 125 B to allow pt to sit for an hour without increased pain for comfort when eating in a restaurant and traveling     Time  2    Period  Weeks    Status  On-going      PT SHORT TERM GOAL #4   Title  PT to be walking as an exercise at home  and to have worked up to walking for 15 mintues without resting     Time  2    Period  Weeks    Status  On-going      PT SHORT TERM GOAL #5   Title  Pt to be completing back HEP to decrease pain to allow pt to focus on knee rehab     Time  2    Period  Weeks    Status  New    Target Date  07/05/17      Additional Short Term Goals   Additional Short Term Goals  Yes      PT SHORT TERM GOAL #6   Title  Pt Low back pain to be no greater than a 5/10 to allow pt to complete his knee exercises.     Time  2    Period  Weeks    Status  New    Target Date  07/05/17        PT Long Term Goals - 06/21/17 1019      PT LONG TERM GOAL #1   Title  PT LE strength to be increased one grade to allowpt to go up and down steps in a reciprocal manner.     Time  4    Period  Weeks    Status  On-going      PT LONG TERM GOAL #2   Title  Pt pain level to be no greater than 2/10 in either knee to allow pt to sleep for 6 hours without waking.     Time  4    Period  Weeks    Status  On-going      PT LONG TERM GOAL #3   Title  PT to be walking without any assistive device.     Time  4    Period  Weeks    Status  On-going      PT LONG TERM GOAL #4   Title  PT to be able to walk 600 ft in a 3 minute time period for improved cadance for community ambulation.     Time  4    Period  Weeks    Status  On-going      PT LONG TERM GOAL #5   Title  Pt to have returned to the gym to work out.     Time  4    Period  Weeks    Status  On-going      Additional Long Term Goals   Additional Long Term Goals  Yes      PT LONG TERM GOAL #6   Title  Pt low back pan to decrease to no greater than a 1/10 to allow pt to sleep throughout the night     Time  4    Period  Weeks    Status  New    Target Date  07/19/17      PT LONG TERM GOAL #7   Title  PT low back mobility to improve to allow pt to reach the floor to pick items up without increased discomfort     Time  4    Period  Weeks    Status  New       PT LONG TERM GOAL #  8   Title  PT to no longer have mm spasm in Left quadratus lumborum     Time  4    Period  Weeks    Status  New            Plan - 06/21/17 1008    Clinical Impression Statement  Pt to department with new order for evaluation of his lumbar as well as continuing to work on pt knees.  Therapist assessed lumbar area please see above for results.  Pt has significant decreaed ROM  in his lumbar spine and decreased flexibiilty in his hamstring musculature.  He has spasm in his left quadratus lumborum and fair body mechanics.  He continues to have increased  edema, pain and decreased ROM of both his knees due to B TKR . Mr. Zaremba will benefit from skilled physical theapy to address these issues and maximize his functional ability .     History and Personal Factors relevant to plan of care:  B TKR 2/21     Rehab Potential  Good    PT Frequency  3x / week    PT Duration  4 weeks    PT Treatment/Interventions  ADLs/Self Care Home Management;Therapeutic exercise;Balance training;Manual techniques;Patient/family education;Stair training;Gait training;Therapeutic activities    PT Next Visit Plan  Complete rehabilitation for both knees and lumbar area.  Sit to stand with no additional height next session if no back is not agrevated. Progress to prone hip extension and squats for back and knee;  Back stabilization to include bridging  Manual for lumbar and knee area as needed.    PT Home Exercise Plan  Eval:  Rt LAQ, heel raises, SLS, quad set and heelslides.        Patient will benefit from skilled therapeutic intervention in order to improve the following deficits and impairments:  Abnormal gait, Decreased activity tolerance, Decreased balance, Decreased range of motion, Decreased mobility, Decreased strength, Difficulty walking, Increased edema, Pain, Impaired flexibility, Improper body mechanics, Postural dysfunction  Visit Diagnosis: Acute pain of right knee - Plan: PT  plan of care cert/re-cert  Acute pain of left knee - Plan: PT plan of care cert/re-cert  Difficulty in walking, not elsewhere classified - Plan: PT plan of care cert/re-cert  Acute left-sided low back pain without sciatica - Plan: PT plan of care cert/re-cert     Problem List Patient Active Problem List   Diagnosis Date Noted  . Obese 05/22/2017  . S/P bilateral TKAs 05/20/2017  . Heart block AV second degree-nocturnal 06/11/2015  . Tobacco use 01/25/2014  . OSA on CPAP 01/25/2014  . Chest pain 01/09/2014  . Atrial fibrillation ([place]) 01/09/2014  . HTN (hypertension) 01/09/2014  . GERD (gastroesophageal reflux disease) 01/09/2014    Rayetta Humphrey, PT CLT [phone] 06/21/2017, 10:33 AM  [facility] [address] [place], [place], [address] Phone: [phone]   Fax:  [phone]  Name: Sean Morales MRN: [id number] Date of Birth: [birth year]

## 2017-06-21 NOTE — Patient Instructions (Addendum)
Backward Bend (Standing)    Arch backward to make hollow of back deeper. Hold ___5-10_ seconds. Repeat _10___ times per set. Do __1__ sets per session. Do _3___ sessions per day.  http://orth.exer.us/178   Copyright  VHI. All rights reserved.  Hamstring Stretch: Active    Support behind right knee. Starting with knee bent, attempt to straighten knee until a comfortable stretch is felt in back of thigh. Hold _30___ seconds. Repeat _3___ times per set. Do ____1 sets per session. Do __3__ sessions per day.  http://orth.exer.us/158   Copyright  VHI. All rights reserved.  Knee-to-Chest Stretch: Unilateral   May use a towel to assist With hand behind right knee, pull knee in to chest until a comfortable stretch is felt in lower back and buttocks. Keep back relaxed. Hold 30____ seconds.repeat to left  Repeat __3__ times per set. Do __1__ sets per session. Do __3__ sessions per day.  http://orth.exer.us/126   Copyright  VHI. All rights reserved.  Lumbar Rotation (Non-Weight Bearing)    Feet on floor, slowly rock knees from side to side in small, pain-free range of motion. Allow lower back to rotate slightly. Repeat _10___ times per set. Do _1___ sets per session. Do ___3_ sessions per day.  http://orth.exer.us/160   Copyright  VHI. All rights reserved.  Isometric Abdominal    Lying on back with knees bent, tighten stomach by pressing elbows down. Hold _3-5___ seconds. Repeat _10___ times per set. Do ____1 sets per session. Do __3__ sessions per day.  http://orth.exer.us/1086   Copyright  VHI. All rights reserved.  Isometric Gluteals    Tighten buttock muscles.  May do on stomach or sitting  Repeat _10___ times per set. Do __1__ sets per session. Do __3__ sessions per day.  http://orth.exer.us/1126   Copyright  VHI. All rights reserved.  On Elbows (Prone)    Rise up on elbows as high as possible, keeping hips on floor. Hold ___60_ seconds. Repeat ___1_  times per set. Do __1__ sets per session. Do _2___ sessions per day.  http://orth.exer.us/92   Copyright  VHI. All rights reserved.

## 2017-06-24 ENCOUNTER — Other Ambulatory Visit (HOSPITAL_COMMUNITY): Payer: Self-pay | Admitting: Nurse Practitioner

## 2017-06-24 ENCOUNTER — Encounter (HOSPITAL_COMMUNITY): Payer: Self-pay | Admitting: Physical Therapy

## 2017-06-24 ENCOUNTER — Ambulatory Visit (HOSPITAL_COMMUNITY): Payer: BLUE CROSS/BLUE SHIELD | Admitting: Physical Therapy

## 2017-06-24 DIAGNOSIS — M545 Low back pain, unspecified: Secondary | ICD-10-CM

## 2017-06-24 DIAGNOSIS — M25561 Pain in right knee: Secondary | ICD-10-CM | POA: Diagnosis not present

## 2017-06-24 DIAGNOSIS — M25562 Pain in left knee: Secondary | ICD-10-CM

## 2017-06-24 DIAGNOSIS — R262 Difficulty in walking, not elsewhere classified: Secondary | ICD-10-CM | POA: Diagnosis not present

## 2017-06-24 NOTE — Therapy (Signed)
[facility] [address] [place], [place], [address] Phone: [phone]   Fax:  [phone]  Physical Therapy Treatment  Patient Details  Name: Sean Morales MRN: [id number] Date of Birth: 12-02-1960 Referring Provider: Gerrit Halls    Encounter Date: 06/24/2017  PT End of Session - 06/24/17 0856    Visit Number  5    Number of Visits  15    Date for PT Re-Evaluation  07/19/17 mini-reassess 3/29    Authorization Type  BCBS    Authorization Time Period  06/21/17-4/19    PT Start Time  0815    PT Stop Time  0942    PT Time Calculation (min)  87 min    Activity Tolerance  Patient tolerated treatment well    Behavior During Therapy  [facility] for tasks assessed/performed       Past Medical History:  Diagnosis Date  . Bilateral knee pain   . Depression    no current meds  . DJD (degenerative joint disease)   . First degree heart block   . GERD (gastroesophageal reflux disease)   . Hypertension   . OA (osteoarthritis) of knee    bilateral  . Obstructive sleep apnea   . Persistent atrial fibrillation ([place]) first dx 10/ 2015-- previous primary cardiologist, [name]   followed by atrial fib. clinic-- [name] NP    Past Surgical History:  Procedure Laterality Date  . APPENDECTOMY  age 83  . CARDIOVASCULAR STRESS TEST  01-17-2015  [name]   Low risk nuclear study w/ no ischemia/  normal LV function and wal motion,  nuclear stress ef 60%  . CARDIOVERSION N/A 01/20/2015   Procedure: CARDIOVERSION;  Surgeon: [name], MD;  Location: [facility];  Service: Cardiovascular;  Laterality: N/A;  . colonscopy  age 32  . cysts removed from nipples  in high school   and neck  . ORIF RIGHT KNEE  age 40  . TONSILLECTOMY  age 39  . TOTAL KNEE ARTHROPLASTY Bilateral 05/20/2017   Procedure: TOTAL KNEE BILATERAL;  Surgeon: [name], MD;  Location: [facility];  Service: Orthopedics;  Laterality: Bilateral;  Bilateral Adductor  Block  . TRANSTHORACIC ECHOCARDIOGRAM  03/29/2017   ef 60-65%/  trivial AR, MR, and TR/  mild LAE/ moderate RAE/  PASP 19mmHg    There were no vitals filed for this visit.  Subjective Assessment - 06/24/17 0816    Subjective  Mr. Shallenberger states that his back is feeling slightly better but his knees are not loosening up like he would like therm tol      Pertinent History  OA    How long can you sit comfortably?  30 minutes     How long can you stand comfortably?  15 minutes     How long can you walk comfortably?  uses a walker and a cane.   So far the greatest that he has walked has been five minutes at a time.     Patient Stated Goals  To walk with confidence     Currently in Pain?  Yes    Pain Score  3     Pain Location  Back    Pain Orientation  Lower    Pain Descriptors / Indicators  Aching    Pain Onset  In the past 7 days    Pain Frequency  Intermittent    Multiple Pain Sites  Yes    Pain Score  2  Pain Location  Knee    Pain Orientation  Right;Left    Pain Descriptors / Indicators  Aching    Pain Onset  1 to 4 weeks ago    Pain Frequency  Intermittent    Aggravating Factors   activity     Pain Relieving Factors  ice     Effect of Pain on Daily Activities  limits                 No data recorded       [facility] Adult PT Treatment/Exercise - 06/24/17 0001      Ambulation/Gait   Gait Comments  Working on heel to toe gt with arm swing       Exercises   Exercises  Knee/Hip      Knee/Hip Exercises: Stretches   Active Hamstring Stretch  Both;3 reps;30 seconds    Quad Stretch  Both;3 reps;30 seconds    Other Knee/Hip Stretches  slant board 3x 30"     Other Knee/Hip Stretches  hip 3 D excursion x 3       Knee/Hip Exercises: Standing   Heel Raises  Both;15 reps    Lateral Step Up  Both;10 reps;Hand Hold: 1;Step Height: 4"    Forward Step Up  Both;10 reps;Step Height: 4"    Functional Squat  10 reps      Knee/Hip Exercises: Supine   Quad Sets  Both;10  reps    Heel Slides  Both;5 reps    Terminal Knee Extension  10 reps    Bridges  Both;10 reps    Knee Extension  AROM    Knee Extension Limitations  B 0    Knee Flexion  AROM    Knee Flexion Limitations  Lt 122; R 120      Knee/Hip Exercises: Prone   Hamstring Curl  10 reps    Hamstring Curl Limitations  B     Hip Extension  Strengthening;Both;10 reps    Other Prone Exercises  POE x 60" followed by press ups x 5     Other Prone Exercises  terminal knee extension x 10 B       Manual Therapy   Manual Therapy  Edema management;Soft tissue mobilization    Manual therapy comments  seperate from all other aspects of treatment     Edema Management  To B knee with more time spent on Rt knee retrograde massage to decrease edema    Soft tissue mobilization  deep soft tissue mob to Lt quadratus lumborum                PT Short Term Goals - 06/21/17 1017      PT SHORT TERM GOAL #1   Title  Pt knee extension in B knees to be 3 or less degrees to allow heel toe gait pattern.     Time  2    Period  Weeks    Status  On-going      PT SHORT TERM GOAL #2   Title  PT to be walking inside his home without an assistive device     Time  2    Period  Weeks    Status  On-going      PT SHORT TERM GOAL #3   Title  PT knee flexion to be 125 B to allow pt to sit for an hour without increased pain for comfort when eating in a restaurant and traveling     Time  2  Period  Weeks    Status  On-going      PT SHORT TERM GOAL #4   Title  PT to be walking as an exercise at home and to have worked up to walking for 15 mintues without resting     Time  2    Period  Weeks    Status  On-going      PT SHORT TERM GOAL #5   Title  Pt to be completing back HEP to decrease pain to allow pt to focus on knee rehab     Time  2    Period  Weeks    Status  New    Target Date  07/05/17      Additional Short Term Goals   Additional Short Term Goals  Yes      PT SHORT TERM GOAL #6   Title  Pt Low  back pain to be no greater than a 5/10 to allow pt to complete his knee exercises.     Time  2    Period  Weeks    Status  New    Target Date  07/05/17        PT Long Term Goals - 06/21/17 1019      PT LONG TERM GOAL #1   Title  PT LE strength to be increased one grade to allowpt to go up and down steps in a reciprocal manner.     Time  4    Period  Weeks    Status  On-going      PT LONG TERM GOAL #2   Title  Pt pain level to be no greater than 2/10 in either knee to allow pt to sleep for 6 hours without waking.     Time  4    Period  Weeks    Status  On-going      PT LONG TERM GOAL #3   Title  PT to be walking without any assistive device.     Time  4    Period  Weeks    Status  On-going      PT LONG TERM GOAL #4   Title  PT to be able to walk 600 ft in a 3 minute time period for improved cadance for community ambulation.     Time  4    Period  Weeks    Status  On-going      PT LONG TERM GOAL #5   Title  Pt to have returned to the gym to work out.     Time  4    Period  Weeks    Status  On-going      Additional Long Term Goals   Additional Long Term Goals  Yes      PT LONG TERM GOAL #6   Title  Pt low back pan to decrease to no greater than a 1/10 to allow pt to sleep throughout the night     Time  4    Period  Weeks    Status  New    Target Date  07/19/17      PT LONG TERM GOAL #7   Title  PT low back mobility to improve to allow pt to reach the floor to pick items up without increased discomfort     Time  4    Period  Weeks    Status  New      PT LONG TERM GOAL #8   Title  PT to no longer have mm spasm in Left quadratus lumborum     Time  4    Period  Weeks    Status  New            Plan - 06/24/17 0857    Clinical Impression Statement  Today's treatment focused both on lumbar stability and Knee ROM and strength with pt improving in all aspects.  Added squats, bridging and prone knee extension with manual to decrease knee edema and back  spasms.      Rehab Potential  Good    PT Frequency  3x / week    PT Duration  4 weeks    PT Treatment/Interventions  ADLs/Self Care Home Management;Therapeutic exercise;Balance training;Manual techniques;Patient/family education;Stair training;Gait training;Therapeutic activities    PT Next Visit Plan  Begin sit to stand as pt was to fatigued as well as stabilizing lunging to both knee mobility and back stabilization     PT Home Exercise Plan  Eval:  Rt LAQ, heel raises, SLS, quad set and heelslides.        Patient will benefit from skilled therapeutic intervention in order to improve the following deficits and impairments:  Abnormal gait, Decreased activity tolerance, Decreased balance, Decreased range of motion, Decreased mobility, Decreased strength, Difficulty walking, Increased edema, Pain, Impaired flexibility, Improper body mechanics, Postural dysfunction  Visit Diagnosis: Acute pain of right knee  Acute pain of left knee  Difficulty in walking, not elsewhere classified  Acute left-sided low back pain without sciatica     Problem List Patient Active Problem List   Diagnosis Date Noted  . Obese 05/22/2017  . S/P bilateral TKAs 05/20/2017  . Heart block AV second degree-nocturnal 06/11/2015  . Tobacco use 01/25/2014  . OSA on CPAP 01/25/2014  . Chest pain 01/09/2014  . Atrial fibrillation ([place]) 01/09/2014  . HTN (hypertension) 01/09/2014  . GERD (gastroesophageal reflux disease) 01/09/2014    Rayetta Humphrey, PT CLT [phone] 06/24/2017, 9:46 AM  [facility] [address] [place], [place], [address] Phone: [phone]   Fax:  [phone]  Name: Sean Morales MRN: [id number] Date of Birth: 07-17-1960

## 2017-06-26 ENCOUNTER — Ambulatory Visit (HOSPITAL_COMMUNITY): Payer: BLUE CROSS/BLUE SHIELD | Admitting: Physical Therapy

## 2017-06-26 DIAGNOSIS — R262 Difficulty in walking, not elsewhere classified: Secondary | ICD-10-CM

## 2017-06-26 DIAGNOSIS — M25562 Pain in left knee: Secondary | ICD-10-CM | POA: Diagnosis not present

## 2017-06-26 DIAGNOSIS — M25561 Pain in right knee: Secondary | ICD-10-CM | POA: Diagnosis not present

## 2017-06-26 DIAGNOSIS — M545 Low back pain, unspecified: Secondary | ICD-10-CM

## 2017-06-26 NOTE — Therapy (Signed)
[facility] [place], [place], [address] Phone: [phone]   Fax:  [phone]  Physical Therapy Treatment  Patient Details  Name: Sean Morales MRN: [id number] Date of Birth: 27-Dec-1960 Referring Provider: Gerrit Halls    Encounter Date: 06/26/2017  PT End of Session - 06/26/17 1819    Visit Number  6    Number of Visits  15    Date for PT Re-Evaluation  07/19/17 mini-reassess 3/29    Authorization Type  BCBS    Authorization Time Period  06/21/17-4/19    PT Start Time  1303    PT Stop Time  1435    PT Time Calculation (min)  92 min    Activity Tolerance  Patient tolerated treatment well    Behavior During Therapy  [facility] for tasks assessed/performed       Past Medical History:  Diagnosis Date  . Bilateral knee pain   . Depression    no current meds  . DJD (degenerative joint disease)   . First degree heart block   . GERD (gastroesophageal reflux disease)   . Hypertension   . OA (osteoarthritis) of knee    bilateral  . Obstructive sleep apnea   . Persistent atrial fibrillation ([place]) first dx 10/ 2015-- previous primary cardiologist, [name]   followed by atrial fib. clinic-- [name] NP    Past Surgical History:  Procedure Laterality Date  . APPENDECTOMY  age 26  . CARDIOVASCULAR STRESS TEST  01-17-2015  [name]   Low risk nuclear study w/ no ischemia/  normal LV function and wal motion,  nuclear stress ef 60%  . CARDIOVERSION N/A 01/20/2015   Procedure: CARDIOVERSION;  Surgeon: [name], MD;  Location: [facility];  Service: Cardiovascular;  Laterality: N/A;  . colonscopy  age 20  . cysts removed from nipples  in high school   and neck  . ORIF RIGHT KNEE  age 10  . TONSILLECTOMY  age 12  . TOTAL KNEE ARTHROPLASTY Bilateral 05/20/2017   Procedure: TOTAL KNEE BILATERAL;  Surgeon: [name], MD;  Location: [facility];  Service: Orthopedics;  Laterality: Bilateral;  Bilateral Adductor  Block  . TRANSTHORACIC ECHOCARDIOGRAM  03/29/2017   ef 60-65%/  trivial AR, MR, and TR/  mild LAE/ moderate RAE/  PASP 62mmHg    There were no vitals filed for this visit.  Subjective Assessment - 06/26/17 1308    Subjective  Pt states his Rt knee is feeling rough today.  States he was walking and his Rt knee "gave way" and he nearly fell.  States he put alot of pressure on his Lt LE and wrenched his back when it happened.      Currently in Pain?  Yes    Pain Score  2     Pain Location  Back    Pain Orientation  Lower    Pain Descriptors / Indicators  Sore;Nagging    Pain Score  4    Pain Location  Knee    Pain Orientation  Right    Pain Descriptors / Indicators  Aching;Discomfort    Pain Score  2    Pain Location  Knee    Pain Orientation  Left    Pain Descriptors / Indicators  Aching         [facility] PT Assessment - 06/26/17 0001      Assessment   Medical Diagnosis  B TKR/lumbar pain  Circumferential Edema   Circumferential - Right  47 cm mid patellar    Circumferential - Left   46 cm mid patellar            No data recorded       [facility] Adult PT Treatment/Exercise - 06/26/17 0001      Knee/Hip Exercises: Stretches   Active Hamstring Stretch  Both;3 reps;30 seconds;Limitations    Active Hamstring Stretch Limitations  standing 12" step    Quad Stretch  Both;3 reps;30 seconds    Other Knee/Hip Stretches  slant board 3x 30"       Knee/Hip Exercises: Standing   Heel Raises  Both;15 reps    Forward Lunges  Both;10 reps;Limitations    Forward Lunges Limitations  onto 4" step    Lateral Step Up  Both;10 reps;Hand Hold: 1;Step Height: 4"    Forward Step Up  Both;10 reps;Step Height: 4"    Step Down  Both;10 reps;Hand Hold: 1;Step Height: 4"    SLS  bilaterally 1 minute    Other Standing Knee Exercises  Tandem stance on foam 1' each direction then UE flexion in tandem stance on foam 15x 3# dowel rod      Knee/Hip Exercises: Seated   Sit to Sand  5  reps;without UE support      Knee/Hip Exercises: Supine   Short Arc Quad Sets  Both;15 reps;Limitations    Short Arc Quad Sets Limitations  3" holds    Bridges  15 reps    Knee Extension  AROM    Knee Extension Limitations  Bilateraly 0    Knee Flexion  AROM    Knee Flexion Limitations  Lt 122; Rt 120      Knee/Hip Exercises: Prone   Hamstring Curl  15 reps    Hamstring Curl Limitations  B     Hip Extension  Strengthening;Both;10 reps    Other Prone Exercises  POE x 60" followed by press ups x 5       Manual Therapy   Manual Therapy  Edema management;Soft tissue mobilization    Manual therapy comments  seperate from all other aspects of treatment     Edema Management  To B knee with more time spent on Rt knee retrograde massage to decrease edema    Soft tissue mobilization  deep soft tissue mob to Lt quadratus lumborum                PT Short Term Goals - 06/21/17 1017      PT SHORT TERM GOAL #1   Title  Pt knee extension in B knees to be 3 or less degrees to allow heel toe gait pattern.     Time  2    Period  Weeks    Status  On-going      PT SHORT TERM GOAL #2   Title  PT to be walking inside his home without an assistive device     Time  2    Period  Weeks    Status  On-going      PT SHORT TERM GOAL #3   Title  PT knee flexion to be 125 B to allow pt to sit for an hour without increased pain for comfort when eating in a restaurant and traveling     Time  2    Period  Weeks    Status  On-going      PT SHORT TERM GOAL #4   Title  PT  to be walking as an exercise at home and to have worked up to walking for 15 mintues without resting     Time  2    Period  Weeks    Status  On-going      PT SHORT TERM GOAL #5   Title  Pt to be completing back HEP to decrease pain to allow pt to focus on knee rehab     Time  2    Period  Weeks    Status  New    Target Date  07/05/17      Additional Short Term Goals   Additional Short Term Goals  Yes      PT SHORT TERM  GOAL #6   Title  Pt Low back pain to be no greater than a 5/10 to allow pt to complete his knee exercises.     Time  2    Period  Weeks    Status  New    Target Date  07/05/17        PT Long Term Goals - 06/21/17 1019      PT LONG TERM GOAL #1   Title  PT LE strength to be increased one grade to allowpt to go up and down steps in a reciprocal manner.     Time  4    Period  Weeks    Status  On-going      PT LONG TERM GOAL #2   Title  Pt pain level to be no greater than 2/10 in either knee to allow pt to sleep for 6 hours without waking.     Time  4    Period  Weeks    Status  On-going      PT LONG TERM GOAL #3   Title  PT to be walking without any assistive device.     Time  4    Period  Weeks    Status  On-going      PT LONG TERM GOAL #4   Title  PT to be able to walk 600 ft in a 3 minute time period for improved cadance for community ambulation.     Time  4    Period  Weeks    Status  On-going      PT LONG TERM GOAL #5   Title  Pt to have returned to the gym to work out.     Time  4    Period  Weeks    Status  On-going      Additional Long Term Goals   Additional Long Term Goals  Yes      PT LONG TERM GOAL #6   Title  Pt low back pan to decrease to no greater than a 1/10 to allow pt to sleep throughout the night     Time  4    Period  Weeks    Status  New    Target Date  07/19/17      PT LONG TERM GOAL #7   Title  PT low back mobility to improve to allow pt to reach the floor to pick items up without increased discomfort     Time  4    Period  Weeks    Status  New      PT LONG TERM GOAL #8   Title  PT to no longer have mm spasm in Left quadratus lumborum     Time  4    Period  Weeks  Status  New            Plan - 06/26/17 1820    Clinical Impression Statement  Continued with treatment for lumbar and bilateral TKA's.  PT with most irritation in Rt knee today with noted edema, heat and redness as compared to LT LE.  circumferential measurements  taken reveal a 1 cm difference between bilateral knees.  Able to add forward step downs, sit to stands, lunges and prone hip extension to POC today.  Rt LE with noted weakness as comparted to LT.  Resumed tandem exercise on foam with UE flexion to challenge stability with min assist needed.  PT reported overall improvement at EOS following manual to Rt LE and lumbar musculauture.       Rehab Potential  Good    PT Frequency  3x / week    PT Duration  4 weeks    PT Treatment/Interventions  ADLs/Self Care Home Management;Therapeutic exercise;Balance training;Manual techniques;Patient/family education;Stair training;Gait training;Therapeutic activities    PT Next Visit Plan  Continue to progress LE and core stability as focus remains on bilateral TKA and lumbar weakness.      PT Home Exercise Plan  Eval:  Rt LAQ, heel raises, SLS, quad set and heelslides.        Patient will benefit from skilled therapeutic intervention in order to improve the following deficits and impairments:  Abnormal gait, Decreased activity tolerance, Decreased balance, Decreased range of motion, Decreased mobility, Decreased strength, Difficulty walking, Increased edema, Pain, Impaired flexibility, Improper body mechanics, Postural dysfunction  Visit Diagnosis: Acute pain of right knee  Acute pain of left knee  Difficulty in walking, not elsewhere classified  Acute left-sided low back pain without sciatica     Problem List Patient Active Problem List   Diagnosis Date Noted  . Obese 05/22/2017  . S/P bilateral TKAs 05/20/2017  . Heart block AV second degree-nocturnal 06/11/2015  . Tobacco use 01/25/2014  . OSA on CPAP 01/25/2014  . Chest pain 01/09/2014  . Atrial fibrillation ([place]) 01/09/2014  . HTN (hypertension) 01/09/2014  . GERD (gastroesophageal reflux disease) 01/09/2014   Teena Irani, PTA/CLT [phone]  Teena Irani 06/26/2017, 6:30 PM  [facility] [address] [place], [place], [address] Phone: [phone]   Fax:  [phone]  Name: Sean Morales MRN: [id number] Date of Birth: 28-Mar-1961

## 2017-06-28 ENCOUNTER — Ambulatory Visit (HOSPITAL_COMMUNITY): Payer: BLUE CROSS/BLUE SHIELD

## 2017-06-28 DIAGNOSIS — M545 Low back pain, unspecified: Secondary | ICD-10-CM

## 2017-06-28 DIAGNOSIS — M25562 Pain in left knee: Secondary | ICD-10-CM

## 2017-06-28 DIAGNOSIS — M25561 Pain in right knee: Secondary | ICD-10-CM | POA: Diagnosis not present

## 2017-06-28 DIAGNOSIS — R262 Difficulty in walking, not elsewhere classified: Secondary | ICD-10-CM

## 2017-06-28 NOTE — Therapy (Signed)
[place], [place], [address] Phone: [phone]   Fax:  [phone]  Physical Therapy Treatment  Patient Details  Name: Sean Morales MRN: [id number] Date of Birth: 02-23-1961 Referring Provider: Gerrit Halls    Encounter Date: 06/28/2017  PT End of Session - 06/28/17 1432    Visit Number  7    Number of Visits  15    Date for PT Re-Evaluation  07/19/17    Authorization Type  BCBS    Authorization Time Period  06/21/17-4/19 (reassessment 3/29)     PT Start Time  1351    PT Stop Time  1512    PT Time Calculation (min)  81 min    Activity Tolerance  Patient tolerated treatment well;No increased pain    Behavior During Therapy  WFL for tasks assessed/performed       Past Medical History:  Diagnosis Date  . Bilateral knee pain   . Depression    no current meds  . DJD (degenerative joint disease)   . First degree heart block   . GERD (gastroesophageal reflux disease)   . Hypertension   . OA (osteoarthritis) of knee    bilateral  . Obstructive sleep apnea   . Persistent atrial fibrillation ([place]) first dx 10/ 2015-- previous primary cardiologist, [name]   followed by atrial fib. clinic-- [name] NP    Past Surgical History:  Procedure Laterality Date  . APPENDECTOMY  age 28  . CARDIOVASCULAR STRESS TEST  01-17-2015  [name]   Low risk nuclear study w/ no ischemia/  normal LV function and wal motion,  nuclear stress ef 60%  . CARDIOVERSION N/A 01/20/2015   Procedure: CARDIOVERSION;  Surgeon: [name], MD;  Location: [facility];  Service: Cardiovascular;  Laterality: N/A;  . colonscopy  age 41  . cysts removed from nipples  in high school   and neck  . ORIF RIGHT KNEE  age 81  . TONSILLECTOMY  age 62  . TOTAL KNEE ARTHROPLASTY Bilateral 05/20/2017   Procedure: TOTAL KNEE BILATERAL;  Surgeon: [name], MD;  Location: [facility];  Service: Orthopedics;  Laterality: Bilateral;   Bilateral Adductor Block  . TRANSTHORACIC ECHOCARDIOGRAM  03/29/2017   ef 60-65%/  trivial AR, MR, and TR/  mild LAE/ moderate RAE/  PASP 6mmHg    There were no vitals filed for this visit.  Subjective Assessment - 06/28/17 1357    Subjective  Pt reports he's doing well today . He thinks his back is alittle bit more aggravated than typical. HisHEP is going well. He got in 12,000 steps last 3 days now that he is wearing his apple watch. Right knee has felt better since last PT session.     Currently in Pain?  Yes    Pain Score  3     Pain Location  Back    Pain Orientation  Lower    Pain Descriptors / Indicators  Sore    Pain Type  Acute pain    Pain Score  2    Pain Location  Knee    Pain Orientation  Right    Pain Score  1    Pain Location  Knee    Pain Orientation  Left         [facility] PT Assessment - 06/28/17 0001      ROM / Strength   AROM / PROM / Strength  AROM      AROM  Right Knee Extension  12    Right Knee Flexion  119    Left Knee Extension  5    Left Knee Flexion  118            No data recorded       [facility] Adult PT Treatment/Exercise - 06/28/17 0001      Ambulation/Gait   Ambulation Distance (Feet)  450 Feet    Assistive device  Straight cane in LUE, moved to RUE d/t right buckling    Gait velocity  0.10m/s    Pre-Gait Activities  cane training for RUe use ~26feet      Exercises   Exercises  Knee/Hip      Knee/Hip Exercises: Stretches   Active Hamstring Stretch  Both 10x5sec supine LAQ     Quad Stretch  Both;30 seconds;2 reps      Knee/Hip Exercises: Aerobic   Nustep  AA/ROM spine/bilat knees: 10 minutes  Seat 10; arms, 13; level 2       Knee/Hip Exercises: Standing   Other Standing Knee Exercises  STS: 2x10 from chair, arms at 90 degrees flexion  education on hip hinge, straight back       Knee/Hip Exercises: Supine   Short Arc Quad Sets  Both;Limitations;10 reps;3 sets basketball underthigh, 2lb weight    Bridges  Both;1 set;10  reps wide stance to avoid painful lateral quad/ITB compression    Other Supine Knee/Hip Exercises  Reverse curl-up: 5x10sec hold DC after Left intermittent sciatic zinging/irritation      Manual Therapy   Manual Therapy  Myofascial release    Myofascial Release  Leftposterio superior glutels along the iliaccrest 15 minutes       Trigger Point Dry Needling - 06/28/17 1516    Consent Given?  Yes    Education Handout Provided  Yes    Muscles Treated Lower Body  Gluteus maximus    Gluteus Maximus Response  Twitch response elicited;Palpable increased muscle length resolution of pain fully, with soem new soreness.           PT Education - 06/28/17 1419    Education provided  Yes    Education Details  daily scar mobilization, neutral lotion: proceed with Lt, hodl on right until eschar is gone    Person(s) Educated  Patient    Methods  Explanation;Demonstration    Comprehension  Need further instruction       PT Short Term Goals - 06/21/17 1017      PT SHORT TERM GOAL #1   Title  Pt knee extension in B knees to be 3 or less degrees to allow heel toe gait pattern.     Time  2    Period  Weeks    Status  On-going      PT SHORT TERM GOAL #2   Title  PT to be walking inside his home without an assistive device     Time  2    Period  Weeks    Status  On-going      PT SHORT TERM GOAL #3   Title  PT knee flexion to be 125 B to allow pt to sit for an hour without increased pain for comfort when eating in a restaurant and traveling     Time  2    Period  Weeks    Status  On-going      PT SHORT TERM GOAL #4   Title  PT to be walking as an exercise at home  and to have worked up to walking for 15 mintues without resting     Time  2    Period  Weeks    Status  On-going      PT SHORT TERM GOAL #5   Title  Pt to be completing back HEP to decrease pain to allow pt to focus on knee rehab     Time  2    Period  Weeks    Status  New    Target Date  07/05/17      Additional Short  Term Goals   Additional Short Term Goals  Yes      PT SHORT TERM GOAL #6   Title  Pt Low back pain to be no greater than a 5/10 to allow pt to complete his knee exercises.     Time  2    Period  Weeks    Status  New    Target Date  07/05/17        PT Long Term Goals - 06/21/17 1019      PT LONG TERM GOAL #1   Title  PT LE strength to be increased one grade to allowpt to go up and down steps in a reciprocal manner.     Time  4    Period  Weeks    Status  On-going      PT LONG TERM GOAL #2   Title  Pt pain level to be no greater than 2/10 in either knee to allow pt to sleep for 6 hours without waking.     Time  4    Period  Weeks    Status  On-going      PT LONG TERM GOAL #3   Title  PT to be walking without any assistive device.     Time  4    Period  Weeks    Status  On-going      PT LONG TERM GOAL #4   Title  PT to be able to walk 600 ft in a 3 minute time period for improved cadance for community ambulation.     Time  4    Period  Weeks    Status  On-going      PT LONG TERM GOAL #5   Title  Pt to have returned to the gym to work out.     Time  4    Period  Weeks    Status  On-going      Additional Long Term Goals   Additional Long Term Goals  Yes      PT LONG TERM GOAL #6   Title  Pt low back pan to decrease to no greater than a 1/10 to allow pt to sleep throughout the night     Time  4    Period  Weeks    Status  New    Target Date  07/19/17      PT LONG TERM GOAL #7   Title  PT low back mobility to improve to allow pt to reach the floor to pick items up without increased discomfort     Time  4    Period  Weeks    Status  New      PT LONG TERM GOAL #8   Title  PT to no longer have mm spasm in Left quadratus lumborum     Time  4    Period  Weeks    Status  New  Plan - 06/28/17 1433    Clinical Impression Statement  Continues to make progress toward goals overall. Isolated quads activation but quads lag in exercise and gait. Pt  continues to have Rt knee buckling, hence worked on [employer] use for enhanced safety and falls recovery. Low back is more flared this session, but continues to improve with manual therapy and stretching.      Rehab Potential  Good    PT Frequency  3x / week    PT Duration  4 weeks    PT Treatment/Interventions  ADLs/Self Care Home Management;Therapeutic exercise;Balance training;Manual techniques;Patient/family education;Stair training;Gait training;Therapeutic activities    PT Next Visit Plan  Continue to progress LE and core stability as focus remains on bilateral TKA and lumbar weakness.      PT Home Exercise Plan  Eval:  Rt LAQ, heel raises, SLS, quad set and heelslides.     Consulted and Agree with Plan of Care  Patient;Family member/caregiver       Patient will benefit from skilled therapeutic intervention in order to improve the following deficits and impairments:  Abnormal gait, Decreased activity tolerance, Decreased balance, Decreased range of motion, Decreased mobility, Decreased strength, Difficulty walking, Increased edema, Pain, Impaired flexibility, Improper body mechanics, Postural dysfunction  Visit Diagnosis: Acute pain of right knee - Plan: PT plan of care cert/re-cert  Acute pain of left knee - Plan: PT plan of care cert/re-cert  Difficulty in walking, not elsewhere classified - Plan: PT plan of care cert/re-cert  Acute left-sided low back pain without sciatica - Plan: PT plan of care cert/re-cert     Problem List Patient Active Problem List   Diagnosis Date Noted  . Obese 05/22/2017  . S/P bilateral TKAs 05/20/2017  . Heart block AV second degree-nocturnal 06/11/2015  . Tobacco use 01/25/2014  . OSA on CPAP 01/25/2014  . Chest pain 01/09/2014  . Atrial fibrillation ([place]) 01/09/2014  . HTN (hypertension) 01/09/2014  . GERD (gastroesophageal reflux disease) 01/09/2014   3:21 PM, 06/28/17 Etta Grandchild, PT, DPT Physical Therapist at [facility] [phone] (office)      Etta Grandchild 06/28/2017, 3:21 PM  [facility] [address] [place], [place], [address] Phone: [phone]   Fax:  [phone]  Name: Sean Morales MRN: [id number] Date of Birth: 12-12-1960

## 2017-07-01 ENCOUNTER — Ambulatory Visit (HOSPITAL_COMMUNITY): Payer: BLUE CROSS/BLUE SHIELD | Attending: Orthopedic Surgery | Admitting: Physical Therapy

## 2017-07-01 ENCOUNTER — Encounter (HOSPITAL_COMMUNITY): Payer: Self-pay | Admitting: Physical Therapy

## 2017-07-01 DIAGNOSIS — M545 Low back pain, unspecified: Secondary | ICD-10-CM

## 2017-07-01 DIAGNOSIS — M25562 Pain in left knee: Secondary | ICD-10-CM | POA: Diagnosis not present

## 2017-07-01 DIAGNOSIS — M25561 Pain in right knee: Secondary | ICD-10-CM | POA: Insufficient documentation

## 2017-07-01 DIAGNOSIS — R262 Difficulty in walking, not elsewhere classified: Secondary | ICD-10-CM | POA: Diagnosis not present

## 2017-07-01 NOTE — Patient Instructions (Addendum)
Forward Lunge    Standing with feet shoulder width apart and stomach tight, step forward with left leg. Repeat _10___ times per set. Do 1____ sets per session. Do _2___ sessions per day.  http://orth.exer.us/1146   Copyright  VHI. All rights reserved.  Functional Quadriceps: Sit to Stand    Sit on edge of chair, feet flat on floor. Stand upright, extending knees fully. Repeat _10___ times per set. Do __1__ sets per session. Do __2__ sessions per day.  http://orth.exer.us/734   Copyright  VHI. All rights reserved.  Hip Extension (Prone)    Lift left leg _4___ inches from floor, keeping knee locked. Repeat _15___ times per set. Do ___1_ sets per session. Do ____ 2sessions per day.  http://orth.exer.us/98   Copyright  VHI. All rights reserved.

## 2017-07-01 NOTE — Therapy (Signed)
[facility] [address] [place], [place], [address] Phone: [phone]   Fax:  [phone]  Physical Therapy Treatment  Patient Details  Name: Sean Morales MRN: [id number] Date of Birth: Jul 03, 1960 Referring Provider: [name]    Encounter Date: 07/01/2017  PT End of Session - 07/01/17 1258    Visit Number  8    Number of Visits  15    Date for PT Re-Evaluation  07/19/17    Authorization Type  BCBS    Authorization Time Period  06/21/17-4/19 (reassessment 3/29)     PT Start Time  1300    PT Stop Time  1425    PT Time Calculation (min)  85 min    Activity Tolerance  Patient tolerated treatment well;No increased pain    Behavior During Therapy  WFL for tasks assessed/performed       Past Medical History:  Diagnosis Date  . Bilateral knee pain   . Depression    no current meds  . DJD (degenerative joint disease)   . First degree heart block   . GERD (gastroesophageal reflux disease)   . Hypertension   . OA (osteoarthritis) of knee    bilateral  . Obstructive sleep apnea   . Persistent atrial fibrillation ([place]) first dx 10/ 2015-- previous primary cardiologist, [name]   followed by atrial fib. clinic-- [name] NP    Past Surgical History:  Procedure Laterality Date  . APPENDECTOMY  age 85  . CARDIOVASCULAR STRESS TEST  01-17-2015  [name]   Low risk nuclear study w/ no ischemia/  normal LV function and wal motion,  nuclear stress ef 60%  . CARDIOVERSION N/A 01/20/2015   Procedure: CARDIOVERSION;  Surgeon: [name], MD;  Location: [facility];  Service: Cardiovascular;  Laterality: N/A;  . colonscopy  age 71  . cysts removed from nipples  in high school   and neck  . ORIF RIGHT KNEE  age 73  . TONSILLECTOMY  age 21  . TOTAL KNEE ARTHROPLASTY Bilateral 05/20/2017   Procedure: TOTAL KNEE BILATERAL;  Surgeon: [name], MD;  Location: [facility];  Service: Orthopedics;  Laterality: Bilateral;   Bilateral Adductor Block  . TRANSTHORACIC ECHOCARDIOGRAM  03/29/2017   ef 60-65%/  trivial AR, MR, and TR/  mild LAE/ moderate RAE/  PASP 10mHg    There were no vitals filed for this visit.  Subjective Assessment - 07/01/17 1300    Subjective  PT states that he was very sore Saturday in both his hamstrings and his quadriceps; able to make a revolution on the bike now     Pertinent History  OA, B TKR; chronic back pain    How long can you sit comfortably?  45-60 minuttes was 30 minutes     How long can you stand comfortably?   20 minutes 15 minutes     How long can you walk comfortably?  uses a cane now was  walker or a cane.   So far the greatest that he has walked has been ten minuts now was minutes at a time.     Patient Stated Goals  To walk with confidence     Currently in Pain?  Yes    Pain Score  2     Pain Location  Knee    Pain Orientation  Right;Left    Pain Descriptors / Indicators  Aching    Pain Onset  1 to 4 weeks ago  Pain Score  2    Pain Location  Back    Pain Orientation  Left;Lower    Pain Descriptors / Indicators  Aching    Pain Type  Acute pain    Pain Onset  1 to 4 weeks ago         [facility] PT Assessment - 07/01/17 0001      Assessment   Medical Diagnosis  B TKR    Referring Provider  [name]     Onset Date/Surgical Date  05/20/17    Next MD Visit  07/03/2017    Prior Therapy  hh      Precautions   Precautions  None      Restrictions   Weight Bearing Restrictions  No      Prior Function   Level of Independence  Independent    Vocation  Full time employment    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     Leisure  get back to the gym       Cognition   Overall Cognitive Status  Within Functional Limits for tasks assessed      Observation/Other Assessments   Focus on Therapeutic Outcomes (FOTO)   50 was 34      Functional Tests   Functional tests  Single leg stance;Sit to Stand      Single Leg Stance   Comments  LT   60 was  24; RT  60 was 21      Sit to Stand   Comments  5x 35.92       AROM   Right Knee Extension  2 was 12    Right Knee Flexion  122 was 117     Left Knee Extension  0 was 5     Left Knee Flexion  128 was 120       Strength   Right Hip Flexion  5/5    Right Hip Extension  4/5 was 4/5    Right Hip ABduction  5/5    Left Hip Flexion  5/5    Left Hip Extension  4/5 was 4/5     Left Hip ABduction  5/5    Right Knee Flexion  4/5 remains 4/5     Right Knee Extension  3+/5    Left Knee Flexion  5/5 was 4/5     Left Knee Extension  5/5    Right Ankle Dorsiflexion  5/5    Left Ankle Dorsiflexion  5/5      Ambulation/Gait   Ambulation Distance (Feet)  228 Feet    Assistive device  Straight cane                   [facility] Adult PT Treatment/Exercise - 07/01/17 0001      Exercises   Exercises  Lumbar;Knee/Hip      Lumbar Exercises: Stretches   Active Hamstring Stretch  3 reps;30 seconds    Single Knee to Chest Stretch  3 reps;30 seconds    Lower Trunk Rotation  5 reps      Lumbar Exercises: Standing   Other Standing Lumbar Exercises  Tandem stance x 2 with both Rt and LT in front       Lumbar Exercises: Sidelying   Clam  Both;10 reps    Hip Abduction  Both;10 reps      Knee/Hip Exercises: Stretches   Other Knee/Hip Stretches  slant board 3x 30"     Other Knee/Hip Stretches  hip  3 D excursion x 3       Knee/Hip Exercises: Standing   Heel Raises  Both;15 reps    Knee Flexion  Both;15 reps    Forward Lunges  Both;15 reps    Terminal Knee Extension  Both;15 reps    Lateral Step Up  Both;10 reps;Hand Hold: 1;Step Height: 6"    Forward Step Up  Both;10 reps;Step Height: 6"    Step Down  Both;15 reps;Hand Hold: 1    Functional Squat  15 reps    Other Standing Knee Exercises  Tandem stance on foam 1' each direction then UE flexion in tandem stance on foam 15x 3# dowel rod      Knee/Hip Exercises: Seated   Sit to Sand  15 reps;without UE support                PT Short Term Goals - 07/01/17 1418      PT SHORT TERM GOAL #1   Title  Pt knee extension in B knees to be 3 or less degrees to allow heel toe gait pattern.     Time  2    Period  Weeks    Status  Achieved      PT SHORT TERM GOAL #2   Title  PT to be walking inside his home without an assistive device     Time  2    Period  Weeks    Status  Partially Met      PT SHORT TERM GOAL #3   Title  PT knee flexion to be 125 B to allow pt to sit for an hour without increased pain for comfort when eating in a restaurant and traveling     Baseline  LT knee 128; RT 122 tested 07/01/2017    Time  2    Period  Weeks    Status  Partially Met      PT SHORT TERM GOAL #4   Title  PT to be walking as an exercise at home and to have worked up to walking for 15 mintues without resting     Time  2    Period  Weeks    Status  Not Met      PT SHORT TERM GOAL #5   Title  Pt to be completing back HEP to decrease pain to allow pt to focus on knee rehab     Time  2    Period  Weeks    Status  Achieved      PT SHORT TERM GOAL #6   Title  Pt Low back pain to be no greater than a 5/10 to allow pt to complete his knee exercises.     Time  2    Period  Weeks    Status  Achieved        PT Long Term Goals - 07/01/17 1421      PT LONG TERM GOAL #1   Title  PT LE strength to be increased one grade to allowpt to go up and down steps in a reciprocal manner.     Time  4    Period  Weeks    Status  Achieved      PT LONG TERM GOAL #2   Title  Pt pain level to be no greater than 2/10 in either knee to allow pt to sleep for 6 hours without waking.     Time  4    Period  Weeks  Status  Partially Met      PT LONG TERM GOAL #3   Title  PT to be walking without any assistive device.     Time  4    Period  Weeks    Status  On-going      PT LONG TERM GOAL #4   Title  PT to be able to walk 600 ft in a 3 minute time period for improved cadance for community ambulation.     Baseline   412 in 3'     Time  4    Period  Weeks    Status  On-going      PT LONG TERM GOAL #5   Title  Pt to have returned to the gym to work out.     Time  4    Period  Weeks    Status  On-going      PT LONG TERM GOAL #6   Title  Pt low back pan to decrease to no greater than a 1/10 to allow pt to sleep throughout the night     Time  4    Period  Weeks    Status  On-going      PT LONG TERM GOAL #7   Title  PT low back mobility to improve to allow pt to reach the floor to pick items up without increased discomfort     Time  4    Period  Weeks    Status  On-going      PT LONG TERM GOAL #8   Title  PT to no longer have mm spasm in Left quadratus lumborum     Time  4    Period  Weeks    Status  On-going            Plan - 07/01/17 1424    Clinical Impression Statement  PT to MD on Wed.  Mini reassessment completed.  PT has improved in all aspects but continues to have decreased mm strength and functional activity.  Pt will continue to benefit from skilled physical therapy to maximize his independence.     Rehab Potential  Good    PT Frequency  3x / week    PT Duration  4 weeks    PT Treatment/Interventions  ADLs/Self Care Home Management;Therapeutic exercise;Balance training;Manual techniques;Patient/family education;Stair training;Gait training;Therapeutic activities    PT Next Visit Plan  Continue to progress LE and core stability as focus remains on bilateral TKA and lumbar weakness.      PT Home Exercise Plan  Eval:  Rt LAQ, heel raises, SLS, quad set and heelslides.     Consulted and Agree with Plan of Care  Patient;Family member/caregiver       Patient will benefit from skilled therapeutic intervention in order to improve the following deficits and impairments:  Abnormal gait, Decreased activity tolerance, Decreased balance, Decreased range of motion, Decreased mobility, Decreased strength, Difficulty walking, Increased edema, Pain, Impaired flexibility, Improper body  mechanics, Postural dysfunction  Visit Diagnosis: Acute pain of right knee  Acute pain of left knee  Difficulty in walking, not elsewhere classified  Acute left-sided low back pain without sciatica     Problem List Patient Active Problem List   Diagnosis Date Noted  . Obese 05/22/2017  . S/P bilateral TKAs 05/20/2017  . Heart block AV second degree-nocturnal 06/11/2015  . Tobacco use 01/25/2014  . OSA on CPAP 01/25/2014  . Chest pain 01/09/2014  . Atrial fibrillation ([place]) 01/09/2014  .  HTN (hypertension) 01/09/2014  . GERD (gastroesophageal reflux disease) 01/09/2014    Rayetta Humphrey, PT CLT [phone] 07/01/2017, 2:38 PM  [facility] [place], [place], [address] Phone: [phone]   Fax:  [phone]  Name: Sean Morales MRN: [id number] Date of Birth: Mar 29, 1961

## 2017-07-03 ENCOUNTER — Ambulatory Visit (HOSPITAL_COMMUNITY): Payer: BLUE CROSS/BLUE SHIELD | Admitting: Physical Therapy

## 2017-07-03 ENCOUNTER — Encounter (HOSPITAL_COMMUNITY): Payer: Self-pay | Admitting: Physical Therapy

## 2017-07-03 DIAGNOSIS — M545 Low back pain, unspecified: Secondary | ICD-10-CM

## 2017-07-03 DIAGNOSIS — Z471 Aftercare following joint replacement surgery: Secondary | ICD-10-CM | POA: Diagnosis not present

## 2017-07-03 DIAGNOSIS — M25561 Pain in right knee: Secondary | ICD-10-CM

## 2017-07-03 DIAGNOSIS — M25562 Pain in left knee: Secondary | ICD-10-CM

## 2017-07-03 DIAGNOSIS — R262 Difficulty in walking, not elsewhere classified: Secondary | ICD-10-CM | POA: Diagnosis not present

## 2017-07-03 DIAGNOSIS — Z96653 Presence of artificial knee joint, bilateral: Secondary | ICD-10-CM | POA: Diagnosis not present

## 2017-07-03 NOTE — Therapy (Signed)
[place], [place], [address] Phone: [phone]   Fax:  [phone]  Physical Therapy Treatment  Patient Details  Name: Sean Morales MRN: [id number] Date of Birth: 1960-12-31 Referring Provider: [name]    Encounter Date: 07/03/2017  PT End of Session - 07/03/17 1349    Visit Number  9    Number of Visits  15    Date for PT Re-Evaluation  07/19/17    Authorization Type  BCBS    Authorization Time Period  06/21/17-4/19 (reassessment 3/29)     PT Start Time  1300    PT Stop Time  1410    PT Time Calculation (min)  70 min    Activity Tolerance  Patient tolerated treatment well;No increased pain    Behavior During Therapy  WFL for tasks assessed/performed       Past Medical History:  Diagnosis Date  . Bilateral knee pain   . Depression    no current meds  . DJD (degenerative joint disease)   . First degree heart block   . GERD (gastroesophageal reflux disease)   . Hypertension   . OA (osteoarthritis) of knee    bilateral  . Obstructive sleep apnea   . Persistent atrial fibrillation ([place]) first dx 10/ 2015-- previous primary cardiologist, [name]   followed by atrial fib. clinic-- [name] NP    Past Surgical History:  Procedure Laterality Date  . APPENDECTOMY  age 79  . CARDIOVASCULAR STRESS TEST  01-17-2015  [name]   Low risk nuclear study w/ no ischemia/  normal LV function and wal motion,  nuclear stress ef 60%  . CARDIOVERSION N/A 01/20/2015   Procedure: CARDIOVERSION;  Surgeon: [name], MD;  Location: [facility];  Service: Cardiovascular;  Laterality: N/A;  . colonscopy  age 54  . cysts removed from nipples  in high school   and neck  . ORIF RIGHT KNEE  age 61  . TONSILLECTOMY  age 44  . TOTAL KNEE ARTHROPLASTY Bilateral 05/20/2017   Procedure: TOTAL KNEE BILATERAL;  Surgeon: [name], MD;  Location: [facility];  Service: Orthopedics;  Laterality: Bilateral;   Bilateral Adductor Block  . TRANSTHORACIC ECHOCARDIOGRAM  03/29/2017   ef 60-65%/  trivial AR, MR, and TR/  mild LAE/ moderate RAE/  PASP 20mHg    There were no vitals filed for this visit.  Subjective Assessment - 07/03/17 1302    Subjective  PT states that he MD was pleased.  He went back to the gym for the first time yesterday.    Pertinent History  OA, B TKR; chronic back pain    How long can you sit comfortably?  45-60 minuttes was 30 minutes     How long can you stand comfortably?   20 minutes 15 minutes     How long can you walk comfortably?  uses a cane now was  walker or a cane.   So far the greatest that he has walked has been ten minuts now was minutes at a time.     Patient Stated Goals  To walk with confidence     Pain Score  0-No pain    Pain Onset  1 to 4 weeks ago    Pain Onset  1 to 4 weeks ago               [facility] - 07/03/17 0001      Ambulation/Gait  Ambulation Distance (Feet)  456 Feet    Assistive device  None      Lumbar Exercises: Stretches   Active Hamstring Stretch  3 reps;30 seconds    Single Knee to Chest Stretch  3 reps;30 seconds    Lower Trunk Rotation  5 reps    Prone on Elbows Stretch  3 reps;30 seconds    Quad Stretch  Right;Left;3 reps;30 seconds      Lumbar Exercises: Standing   Other Standing Lumbar Exercises  Tandem stance x 2 on foam with both Rt and LT in front  push out with green t-band       Lumbar Exercises: Supine   Bridge  15 reps      Lumbar Exercises: Sidelying   Clam  15 reps    Hip Abduction  15 reps      Lumbar Exercises: Prone   Opposite Arm/Leg Raise  Right arm/Left leg;Left arm/Right leg;10 reps      Knee/Hip Exercises: Stretches   Knee: Self-Stretch to increase Flexion  Both;5 reps    Other Knee/Hip Stretches  slant board 3x 30"     Other Knee/Hip Stretches  hip 3 D excursion x 3       Knee/Hip Exercises: Standing   Forward Lunges  Both;15 reps    Lateral Step Up  Both;15  reps;Hand Hold: 0;Step Height: 6"    Forward Step Up  Both;15 reps;Hand Hold: 0;Step Height: 6"    Step Down  Both;15 reps;Hand Hold: 2;Step Height: 6"    Functional Squat  15 reps    Rocker Board  2 minutes    Other Standing Knee Exercises  lifting empty box  x 10       Knee/Hip Exercises: Seated   Sit to Sand  15 reps;without UE support      Knee/Hip Exercises: Supine   Other Supine Knee/Hip Exercises  toe taps x 10                PT Short Term Goals - 07/01/17 1418      PT SHORT TERM GOAL #1   Title  Pt knee extension in B knees to be 3 or less degrees to allow heel toe gait pattern.     Time  2    Period  Weeks    Status  Achieved      PT SHORT TERM GOAL #2   Title  PT to be walking inside his home without an assistive device     Time  2    Period  Weeks    Status  Partially Met      PT SHORT TERM GOAL #3   Title  PT knee flexion to be 125 B to allow pt to sit for an hour without increased pain for comfort when eating in a restaurant and traveling     Baseline  LT knee 128; RT 122 tested 07/01/2017    Time  2    Period  Weeks    Status  Partially Met      PT SHORT TERM GOAL #4   Title  PT to be walking as an exercise at home and to have worked up to walking for 15 mintues without resting     Time  2    Period  Weeks    Status  Not Met      PT SHORT TERM GOAL #5   Title  Pt to be completing back HEP to decrease pain to allow pt  to focus on knee rehab     Time  2    Period  Weeks    Status  Achieved      PT SHORT TERM GOAL #6   Title  Pt Low back pain to be no greater than a 5/10 to allow pt to complete his knee exercises.     Time  2    Period  Weeks    Status  Achieved        PT Long Term Goals - 07/01/17 1421      PT LONG TERM GOAL #1   Title  PT LE strength to be increased one grade to allowpt to go up and down steps in a reciprocal manner.     Time  4    Period  Weeks    Status  Achieved      PT LONG TERM GOAL #2   Title  Pt pain level to  be no greater than 2/10 in either knee to allow pt to sleep for 6 hours without waking.     Time  4    Period  Weeks    Status  Partially Met      PT LONG TERM GOAL #3   Title  PT to be walking without any assistive device.     Time  4    Period  Weeks    Status  On-going      PT LONG TERM GOAL #4   Title  PT to be able to walk 600 ft in a 3 minute time period for improved cadance for community ambulation.     Baseline  412 in 3'     Time  4    Period  Weeks    Status  On-going      PT LONG TERM GOAL #5   Title  Pt to have returned to the gym to work out.     Time  4    Period  Weeks    Status  On-going      PT LONG TERM GOAL #6   Title  Pt low back pan to decrease to no greater than a 1/10 to allow pt to sleep throughout the night     Time  4    Period  Weeks    Status  On-going      PT LONG TERM GOAL #7   Title  PT low back mobility to improve to allow pt to reach the floor to pick items up without increased discomfort     Time  4    Period  Weeks    Status  On-going      PT LONG TERM GOAL #8   Title  PT to no longer have mm spasm in Left quadratus lumborum     Time  4    Period  Weeks    Status  On-going            Plan - 07/03/17 1351    Clinical Impression Statement  Began  treatment walking without asssitive device.  Completed step up and lateral without UE assist.  Added prone quad stretch and prone opposite arm/leg raise.  Started lifting with empty box using proper body mechanics     Rehab Potential  Good    PT Frequency  3x / week    PT Duration  4 weeks    PT Treatment/Interventions  ADLs/Self Care Home Management;Therapeutic exercise;Balance training;Manual techniques;Patient/family education;Stair training;Gait training;Therapeutic activities    PT  Next Visit Plan  begin warrior I and II poses continue to encourage pt to walk without cane.     PT Home Exercise Plan  Eval:  Rt LAQ, heel raises, SLS, quad set and heelslides.     Consulted and Agree  with Plan of Care  Patient;Family member/caregiver       Patient will benefit from skilled therapeutic intervention in order to improve the following deficits and impairments:  Abnormal gait, Decreased activity tolerance, Decreased balance, Decreased range of motion, Decreased mobility, Decreased strength, Difficulty walking, Increased edema, Pain, Impaired flexibility, Improper body mechanics, Postural dysfunction  Visit Diagnosis: Acute pain of right knee  Acute pain of left knee  Difficulty in walking, not elsewhere classified  Acute left-sided low back pain without sciatica     Problem List Patient Active Problem List   Diagnosis Date Noted  . Obese 05/22/2017  . S/P bilateral TKAs 05/20/2017  . Heart block AV second degree-nocturnal 06/11/2015  . Tobacco use 01/25/2014  . OSA on CPAP 01/25/2014  . Chest pain 01/09/2014  . Atrial fibrillation ([place]) 01/09/2014  . HTN (hypertension) 01/09/2014  . GERD (gastroesophageal reflux disease) 01/09/2014    Rayetta Humphrey, PT CLT [phone] 07/03/2017, 2:17 PM  [place] [address] [place], [place], [address] Phone: [phone]   Fax:  [phone]  Name: NICOLAOS MITRANO MRN: [id number] Date of Birth: 02/17/1961

## 2017-07-05 ENCOUNTER — Ambulatory Visit (HOSPITAL_COMMUNITY): Payer: BLUE CROSS/BLUE SHIELD | Admitting: Physical Therapy

## 2017-07-05 ENCOUNTER — Encounter (HOSPITAL_COMMUNITY): Payer: Self-pay | Admitting: Physical Therapy

## 2017-07-05 ENCOUNTER — Other Ambulatory Visit: Payer: Self-pay | Admitting: Cardiovascular Disease

## 2017-07-05 DIAGNOSIS — R262 Difficulty in walking, not elsewhere classified: Secondary | ICD-10-CM | POA: Diagnosis not present

## 2017-07-05 DIAGNOSIS — M545 Low back pain, unspecified: Secondary | ICD-10-CM

## 2017-07-05 DIAGNOSIS — M25561 Pain in right knee: Secondary | ICD-10-CM | POA: Diagnosis not present

## 2017-07-05 DIAGNOSIS — M25562 Pain in left knee: Secondary | ICD-10-CM

## 2017-07-05 NOTE — Therapy (Signed)
[place], [place], [address] Phone: [phone]   Fax:  [phone]  Physical Therapy Treatment  Patient Details  Name: Sean Morales MRN: [id number] Date of Birth: 10-31-1960 Referring Provider: [name]    Encounter Date: 07/05/2017  PT End of Session - 07/05/17 1404    Visit Number  10    Number of Visits  15    Date for PT Re-Evaluation  07/19/17    Authorization Type  BCBS    Authorization Time Period  06/21/17-4/19 (reassessment 3/29)     PT Start Time  1300    PT Stop Time  1423    PT Time Calculation (min)  83 min    Activity Tolerance  Patient tolerated treatment well;No increased pain    Behavior During Therapy  WFL for tasks assessed/performed       Past Medical History:  Diagnosis Date  . Bilateral knee pain   . Depression    no current meds  . DJD (degenerative joint disease)   . First degree heart block   . GERD (gastroesophageal reflux disease)   . Hypertension   . OA (osteoarthritis) of knee    bilateral  . Obstructive sleep apnea   . Persistent atrial fibrillation ([place]) first dx 10/ 2015-- previous primary cardiologist, [name]   followed by atrial fib. clinic-- [name] NP    Past Surgical History:  Procedure Laterality Date  . APPENDECTOMY  age 72  . CARDIOVASCULAR STRESS TEST  01-17-2015  [name]   Low risk nuclear study w/ no ischemia/  normal LV function and wal motion,  nuclear stress ef 60%  . CARDIOVERSION N/A 01/20/2015   Procedure: CARDIOVERSION;  Surgeon: [name], MD;  Location: [facility];  Service: Cardiovascular;  Laterality: N/A;  . colonscopy  age 15  . cysts removed from nipples  in high school   and neck  . ORIF RIGHT KNEE  age 59  . TONSILLECTOMY  age 55  . TOTAL KNEE ARTHROPLASTY Bilateral 05/20/2017   Procedure: TOTAL KNEE BILATERAL;  Surgeon: [name], MD;  Location: [facility];  Service: Orthopedics;  Laterality: Bilateral;   Bilateral Adductor Block  . TRANSTHORACIC ECHOCARDIOGRAM  03/29/2017   ef 60-65%/  trivial AR, MR, and TR/  mild LAE/ moderate RAE/  PASP 58mHg    There were no vitals filed for this visit.  Subjective Assessment - 07/05/17 1300    Subjective  Pt states that he went to a yoga class Wednesday and did well.      Pertinent History  OA, B TKR; chronic back pain    How long can you sit comfortably?  45-60 minuttes was 30 minutes     How long can you stand comfortably?   20 minutes 15 minutes     How long can you walk comfortably?  uses a cane now was  walker or a cane.   So far the greatest that he has walked has been ten minuts now was minutes at a time.     Patient Stated Goals  To walk with confidence     Currently in Pain?  No/denies    Pain Onset  1 to 4 weeks ago    Pain Onset  1 to 4 weeks ago                       [facility] - [facility] 0001  Ambulation/Gait   Ambulation Distance (Feet)  456 Feet    Assistive device  None      Exercises   Exercises  Lumbar;Knee/Hip      Lumbar Exercises: Stretches   Active Hamstring Stretch  3 reps;30 seconds    Single Knee to Chest Stretch  3 reps;30 seconds    Lower Trunk Rotation  5 reps    Prone on Elbows Stretch  3 reps;30 seconds    Press Ups  10 reps    Quad Stretch  Right;Left;3 reps;30 seconds    Other Lumbar Stretch Exercise  downward dog x 30" x4      Lumbar Exercises: Standing   Other Standing Lumbar Exercises  Tandem stance x 2 on foam with both Rt and LT in front  push out with green t-band     Other Standing Lumbar Exercises  Warrior I and II x 60" each       Lumbar Exercises: Supine   Bridge  --    Other Supine Lumbar Exercises  side step with blue tband x 2 RT       Lumbar Exercises: Sidelying   Clam  --    Hip Abduction  --      Lumbar Exercises: Prone   Opposite Arm/Leg Raise  Right arm/Left leg;Left arm/Right leg;15 reps      Knee/Hip Exercises: Stretches   Knee:  Self-Stretch to increase Flexion  Both;5 reps    Other Knee/Hip Stretches  slant board 3x 30"     Other Knee/Hip Stretches  hip 3 D excursion x 3       Knee/Hip Exercises: Standing   Heel Raises  Both;15 reps    Forward Lunges  Both x12     Forward Lunges Limitations  onto bosu     Lateral Step Up  Both;15 reps;Hand Hold: 0;Step Height: 6"    Forward Step Up  Both;15 reps;Hand Hold: 0;Step Height: 6"    Step Down  Both;15 reps;Hand Hold: 2;Step Height: 6"    Functional Squat  15 reps    Rocker Board  2 minutes    SLS with Vectors  15" x 3     Other Standing Knee Exercises  lifting empty box  x 10       Knee/Hip Exercises: Seated   Sit to Sand  15 reps;without UE support      Knee/Hip Exercises: Supine   Other Supine Knee/Hip Exercises  toe taps x 10       Knee/Hip Exercises: Prone   Other Prone Exercises  terminal knee extension x 10 B                PT Short Term Goals - 07/01/17 1418      PT SHORT TERM GOAL #1   Title  Pt knee extension in B knees to be 3 or less degrees to allow heel toe gait pattern.     Time  2    Period  Weeks    Status  Achieved      PT SHORT TERM GOAL #2   Title  PT to be walking inside his home without an assistive device     Time  2    Period  Weeks    Status  Partially Met      PT SHORT TERM GOAL #3   Title  PT knee flexion to be 125 B to allow pt to sit for an hour without increased pain for comfort when eating in a  restaurant and traveling     Baseline  LT knee 128; RT 122 tested 07/01/2017    Time  2    Period  Weeks    Status  Partially Met      PT SHORT TERM GOAL #4   Title  PT to be walking as an exercise at home and to have worked up to walking for 15 mintues without resting     Time  2    Period  Weeks    Status  Not Met      PT SHORT TERM GOAL #5   Title  Pt to be completing back HEP to decrease pain to allow pt to focus on knee rehab     Time  2    Period  Weeks    Status  Achieved      PT SHORT TERM GOAL #6    Title  Pt Low back pain to be no greater than a 5/10 to allow pt to complete his knee exercises.     Time  2    Period  Weeks    Status  Achieved        PT Long Term Goals - 07/01/17 1421      PT LONG TERM GOAL #1   Title  PT LE strength to be increased one grade to allowpt to go up and down steps in a reciprocal manner.     Time  4    Period  Weeks    Status  Achieved      PT LONG TERM GOAL #2   Title  Pt pain level to be no greater than 2/10 in either knee to allow pt to sleep for 6 hours without waking.     Time  4    Period  Weeks    Status  Partially Met      PT LONG TERM GOAL #3   Title  PT to be walking without any assistive device.     Time  4    Period  Weeks    Status  On-going      PT LONG TERM GOAL #4   Title  PT to be able to walk 600 ft in a 3 minute time period for improved cadance for community ambulation.     Baseline  412 in 3'     Time  4    Period  Weeks    Status  On-going      PT LONG TERM GOAL #5   Title  Pt to have returned to the gym to work out.     Time  4    Period  Weeks    Status  On-going      PT LONG TERM GOAL #6   Title  Pt low back pan to decrease to no greater than a 1/10 to allow pt to sleep throughout the night     Time  4    Period  Weeks    Status  On-going      PT LONG TERM GOAL #7   Title  PT low back mobility to improve to allow pt to reach the floor to pick items up without increased discomfort     Time  4    Period  Weeks    Status  On-going      PT LONG TERM GOAL #8   Title  PT to no longer have mm spasm in Left quadratus lumborum     Time  4  Period  Weeks    Status  On-going            Plan - 07/05/17 1404    Clinical Impression Statement  Added warrior I,II, downward dog and vector stances to program with minimal verbal cuing needed.  Pt is now going to the [facility] on a regular basis.  He is to return to work on 4/22.    Rehab Potential  Good    PT Frequency  3x / week    PT Duration  4 weeks    PT  Treatment/Interventions  ADLs/Self Care Home Management;Therapeutic exercise;Balance training;Manual techniques;Patient/family education;Stair training;Gait training;Therapeutic activities    PT Next Visit Plan  See for three more visits and discharge.      PT Home Exercise Plan  Eval:  Rt LAQ, heel raises, SLS, quad set and heelslides.     Consulted and Agree with Plan of Care  Patient;Family member/caregiver       Patient will benefit from skilled therapeutic intervention in order to improve the following deficits and impairments:  Abnormal gait, Decreased activity tolerance, Decreased balance, Decreased range of motion, Decreased mobility, Decreased strength, Difficulty walking, Increased edema, Pain, Impaired flexibility, Improper body mechanics, Postural dysfunction  Visit Diagnosis: Acute pain of right knee  Acute pain of left knee  Difficulty in walking, not elsewhere classified  Acute left-sided low back pain without sciatica     Problem List Patient Active Problem List   Diagnosis Date Noted  . Obese 05/22/2017  . S/P bilateral TKAs 05/20/2017  . Heart block AV second degree-nocturnal 06/11/2015  . Tobacco use 01/25/2014  . OSA on CPAP 01/25/2014  . Chest pain 01/09/2014  . Atrial fibrillation ([place]) 01/09/2014  . HTN (hypertension) 01/09/2014  . GERD (gastroesophageal reflux disease) 01/09/2014    Rayetta Humphrey, PT CLT [phone] 07/05/2017, 2:33 PM  [place] [address] [place], [place], [address] Phone: [phone]   Fax:  [phone]  Name: Sean Morales MRN: [id number] Date of Birth: 26-Jul-1960

## 2017-07-08 ENCOUNTER — Ambulatory Visit (HOSPITAL_COMMUNITY): Payer: BLUE CROSS/BLUE SHIELD

## 2017-07-08 ENCOUNTER — Encounter (HOSPITAL_COMMUNITY): Payer: Self-pay

## 2017-07-08 DIAGNOSIS — R262 Difficulty in walking, not elsewhere classified: Secondary | ICD-10-CM | POA: Diagnosis not present

## 2017-07-08 DIAGNOSIS — M25562 Pain in left knee: Secondary | ICD-10-CM | POA: Diagnosis not present

## 2017-07-08 DIAGNOSIS — M25561 Pain in right knee: Secondary | ICD-10-CM | POA: Diagnosis not present

## 2017-07-08 DIAGNOSIS — M545 Low back pain, unspecified: Secondary | ICD-10-CM

## 2017-07-08 NOTE — Therapy (Signed)
[place], [place], [address] Phone: [phone]   Fax:  [phone]  Physical Therapy Treatment  Patient Details  Name: Sean Morales MRN: [id number] Date of Birth: October 11, 1960 Referring Provider: [name]    Encounter Date: 07/08/2017  PT End of Session - 07/08/17 1324    Visit Number  11    Number of Visits  15    Date for PT Re-Evaluation  07/19/17    Authorization Type  BCBS    Authorization Time Period  06/21/17-4/19 (reassessment 3/29)     PT Start Time  1304    PT Stop Time  1419    PT Time Calculation (min)  75 min    Activity Tolerance  Patient tolerated treatment well;No increased pain    Behavior During Therapy  WFL for tasks assessed/performed       Past Medical History:  Diagnosis Date  . Bilateral knee pain   . Depression    no current meds  . DJD (degenerative joint disease)   . First degree heart block   . GERD (gastroesophageal reflux disease)   . Hypertension   . OA (osteoarthritis) of knee    bilateral  . Obstructive sleep apnea   . Persistent atrial fibrillation ([place]) first dx 10/ 2015-- previous primary cardiologist, [name]   followed by atrial fib. clinic-- [name] NP    Past Surgical History:  Procedure Laterality Date  . APPENDECTOMY  age 76  . CARDIOVASCULAR STRESS TEST  01-17-2015  [name]   Low risk nuclear study w/ no ischemia/  normal LV function and wal motion,  nuclear stress ef 60%  . CARDIOVERSION N/A 01/20/2015   Procedure: CARDIOVERSION;  Surgeon: [name], MD;  Location: [facility];  Service: Cardiovascular;  Laterality: N/A;  . colonscopy  age 18  . cysts removed from nipples  in high school   and neck  . ORIF RIGHT KNEE  age 64  . TONSILLECTOMY  age 67  . TOTAL KNEE ARTHROPLASTY Bilateral 05/20/2017   Procedure: TOTAL KNEE BILATERAL;  Surgeon: [name], MD;  Location: [facility];  Service: Orthopedics;  Laterality: Bilateral;   Bilateral Adductor Block  . TRANSTHORACIC ECHOCARDIOGRAM  03/29/2017   ef 60-65%/  trivial AR, MR, and TR/  mild LAE/ moderate RAE/  PASP 3mHg    There were no vitals filed for this visit.  Subjective Assessment - 07/08/17 1315    Subjective  Pt doing great. HEP great. AMB great. Back pain good now, stabbing 1DA. Knees stiff.     Pertinent History  OA, B TKR; chronic back pain    Currently in Pain?  No/denies         [facility]      AROM   Right Knee Extension  8    Right Knee Flexion  130    Left Knee Extension  4    Left Knee Flexion  131                   [facility] Adult PT Treatment/Exercise - 07/08/17 0001      Ambulation/Gait   Ambulation Distance (Feet)  1125 Feet    Assistive device  None    Gait velocity  0.74m    Gait Comments  RLE vaulting, mild Lt compensated trendelenburg      Lumbar Exercises: Supine   Other Supine Lumbar Exercises  side step [id number] ankle weights  Knee/Hip Exercises: Public affairs consultant  Both;Other (comment) prone stretch with strap: 10x10sec bilat    Other Knee/Hip Stretches  slant board 2x30"       Knee/Hip Exercises: Standing   Heel Raises  Both;15 reps;2 sets    Lateral Step Up  Both;Hand Hold: 0;10 reps;1 set;Step Height: 4"    Forward Step Up  Both;Hand Hold: 0;10 reps;Step Height: 6";1 set    Step Down  Both;Hand Hold: 0;Step Height: 2";1 set;10 reps    Functional Squat  2 sets;10 reps arms in UE flexion    SLS  10x5sec bilat      Knee/Hip Exercises: Supine   Short Arc Quad Sets  Both;Limitations;10 reps;3 sets basketball underthigh, 5lb weight    Bridges  Both;10 reps;3 sets wide stance to avoid painful lateral quad/ITB compression    Other Supine Knee/Hip Exercises  toe taps x 10                PT Short Term Goals - 07/01/17 1418      PT SHORT TERM GOAL #1   Title  Pt knee extension in B knees to be 3 or less degrees to allow heel toe gait pattern.     Time  2     Period  Weeks    Status  Achieved      PT SHORT TERM GOAL #2   Title  PT to be walking inside his home without an assistive device     Time  2    Period  Weeks    Status  Partially Met      PT SHORT TERM GOAL #3   Title  PT knee flexion to be 125 B to allow pt to sit for an hour without increased pain for comfort when eating in a restaurant and traveling     Baseline  LT knee 128; RT 122 tested 07/01/2017    Time  2    Period  Weeks    Status  Partially Met      PT SHORT TERM GOAL #4   Title  PT to be walking as an exercise at home and to have worked up to walking for 15 mintues without resting     Time  2    Period  Weeks    Status  Not Met      PT SHORT TERM GOAL #5   Title  Pt to be completing back HEP to decrease pain to allow pt to focus on knee rehab     Time  2    Period  Weeks    Status  Achieved      PT SHORT TERM GOAL #6   Title  Pt Low back pain to be no greater than a 5/10 to allow pt to complete his knee exercises.     Time  2    Period  Weeks    Status  Achieved        PT Long Term Goals - 07/01/17 1421      PT LONG TERM GOAL #1   Title  PT LE strength to be increased one grade to allowpt to go up and down steps in a reciprocal manner.     Time  4    Period  Weeks    Status  Achieved      PT LONG TERM GOAL #2   Title  Pt pain level to be no greater than 2/10 in either knee to allow pt to  sleep for 6 hours without waking.     Time  4    Period  Weeks    Status  Partially Met      PT LONG TERM GOAL #3   Title  PT to be walking without any assistive device.     Time  4    Period  Weeks    Status  On-going      PT LONG TERM GOAL #4   Title  PT to be able to walk 600 ft in a 3 minute time period for improved cadance for community ambulation.     Baseline  412 in 3'     Time  4    Period  Weeks    Status  On-going      PT LONG TERM GOAL #5   Title  Pt to have returned to the gym to work out.     Time  4    Period  Weeks    Status  On-going       PT LONG TERM GOAL #6   Title  Pt low back pan to decrease to no greater than a 1/10 to allow pt to sleep throughout the night     Time  4    Period  Weeks    Status  On-going      PT LONG TERM GOAL #7   Title  PT low back mobility to improve to allow pt to reach the floor to pick items up without increased discomfort     Time  4    Period  Weeks    Status  On-going      PT LONG TERM GOAL #8   Title  PT to no longer have mm spasm in Left quadratus lumborum     Time  4    Period  Weeks    Status  On-going            Plan - 07/08/17 1325    Clinical Impression Statement  Comtinued to progress funcitonal strength and activitty tolerance. ROM continues to be at or near goal. AMB remains slower than what is associated with community distances and appears mildly unstable at the hip adn pelvis, with reluctant knee flexion during the gait cycle. Progressed step ups to hands free, bu tpatien tclearly struggles in his state of fatigue mafterother exercises. SLS balance is looking improved, better ankle/hip strategy.     Rehab Potential  Good    PT Frequency  3x / week    PT Duration  4 weeks    PT Treatment/Interventions  ADLs/Self Care Home Management;Therapeutic exercise;Balance training;Manual techniques;Patient/family education;Stair training;Gait training;Therapeutic activities    PT Next Visit Plan  See for two more visits and discharge.      PT Home Exercise Plan  Eval:  Rt LAQ, heel raises, SLS, quad set and heelslides.     Consulted and Agree with Plan of Care  Patient;Family member/caregiver       Patient will benefit from skilled therapeutic intervention in order to improve the following deficits and impairments:  Abnormal gait, Decreased activity tolerance, Decreased balance, Decreased range of motion, Decreased mobility, Decreased strength, Difficulty walking, Increased edema, Pain, Impaired flexibility, Improper body mechanics, Postural dysfunction  Visit  Diagnosis: Acute pain of right knee  Difficulty in walking, not elsewhere classified  Acute pain of left knee  Acute left-sided low back pain without sciatica     Problem List Patient Active Problem List   Diagnosis Date Noted  .  Obese 05/22/2017  . S/P bilateral TKAs 05/20/2017  . Heart block AV second degree-nocturnal 06/11/2015  . Tobacco use 01/25/2014  . OSA on CPAP 01/25/2014  . Chest pain 01/09/2014  . Atrial fibrillation ([place]) 01/09/2014  . HTN (hypertension) 01/09/2014  . GERD (gastroesophageal reflux disease) 01/09/2014   2:13 PM, 07/08/17 Etta Grandchild, PT, DPT Physical Therapist at [facility] [phone] (office)      Etta Grandchild 07/08/2017, 2:12 PM  [place] [address] [place], [place], [address] Phone: [phone]   Fax:  [phone]  Name: Sean Morales MRN: [id number] Date of Birth: 12-Feb-1961

## 2017-07-10 ENCOUNTER — Ambulatory Visit (HOSPITAL_COMMUNITY): Payer: BLUE CROSS/BLUE SHIELD | Admitting: Physical Therapy

## 2017-07-10 DIAGNOSIS — M25561 Pain in right knee: Secondary | ICD-10-CM | POA: Diagnosis not present

## 2017-07-10 DIAGNOSIS — R262 Difficulty in walking, not elsewhere classified: Secondary | ICD-10-CM | POA: Diagnosis not present

## 2017-07-10 DIAGNOSIS — J309 Allergic rhinitis, unspecified: Secondary | ICD-10-CM | POA: Diagnosis not present

## 2017-07-10 DIAGNOSIS — M25562 Pain in left knee: Secondary | ICD-10-CM | POA: Diagnosis not present

## 2017-07-10 DIAGNOSIS — J329 Chronic sinusitis, unspecified: Secondary | ICD-10-CM | POA: Diagnosis not present

## 2017-07-10 DIAGNOSIS — E6609 Other obesity due to excess calories: Secondary | ICD-10-CM | POA: Diagnosis not present

## 2017-07-10 DIAGNOSIS — M545 Low back pain, unspecified: Secondary | ICD-10-CM

## 2017-07-10 DIAGNOSIS — Z6836 Body mass index (BMI) 36.0-36.9, adult: Secondary | ICD-10-CM | POA: Diagnosis not present

## 2017-07-10 DIAGNOSIS — M1991 Primary osteoarthritis, unspecified site: Secondary | ICD-10-CM | POA: Diagnosis not present

## 2017-07-10 NOTE — Therapy (Signed)
[place], [place], [address] Phone: [phone]   Fax:  [phone]  Physical Therapy Treatment  Patient Details  Name: Sean Morales MRN: [id number] Date of Birth: 01-Jul-1960 Referring Provider: [name]    Encounter Date: 07/10/2017  PT End of Session - 07/10/17 0920    Visit Number  12    Number of Visits  15    Date for PT Re-Evaluation  07/19/17    Authorization Type  BCBS    Authorization Time Period  06/21/17-4/19 (reassessment 3/29)     PT Start Time  0820    PT Stop Time  0930    PT Time Calculation (min)  70 min    Activity Tolerance  Patient tolerated treatment well;No increased pain    Behavior During Therapy  WFL for tasks assessed/performed       Past Medical History:  Diagnosis Date  . Bilateral knee pain   . Depression    no current meds  . DJD (degenerative joint disease)   . First degree heart block   . GERD (gastroesophageal reflux disease)   . Hypertension   . OA (osteoarthritis) of knee    bilateral  . Obstructive sleep apnea   . Persistent atrial fibrillation ([place]) first dx 10/ 2015-- previous primary cardiologist, [name]   followed by atrial fib. clinic-- [name] NP    Past Surgical History:  Procedure Laterality Date  . APPENDECTOMY  age 57  . CARDIOVASCULAR STRESS TEST  01-17-2015  [name]   Low risk nuclear study w/ no ischemia/  normal LV function and wal motion,  nuclear stress ef 60%  . CARDIOVERSION N/A 01/20/2015   Procedure: CARDIOVERSION;  Surgeon: [name], MD;  Location: [facility];  Service: Cardiovascular;  Laterality: N/A;  . colonscopy  age 57  . cysts removed from nipples  in high school   and neck  . ORIF RIGHT KNEE  age 57  . TONSILLECTOMY  age 57  . TOTAL KNEE ARTHROPLASTY Bilateral 05/20/2017   Procedure: TOTAL KNEE BILATERAL;  Surgeon: [name], MD;  Location: [facility];  Service: Orthopedics;  Laterality: Bilateral;   Bilateral Adductor Block  . TRANSTHORACIC ECHOCARDIOGRAM  03/29/2017   ef 60-65%/  trivial AR, MR, and TR/  mild LAE/ moderate RAE/  PASP 38mHg    There were no vitals filed for this visit.  Subjective Assessment - 07/10/17 0826    Subjective  Pt states he is doing well.  Reports he went to the gym yesterday and walked the track for 10 minutes.  Returns to work April 22    Currently in Pain?  No/denies         [facility] - 07[facility]      AROM   Right Knee Extension  0    Right Knee Flexion  122    Left Knee Extension  0    Left Knee Flexion  132                   [facility] Adult PT Treatment/Exercise - 07/10/17 0001      Knee/Hip Exercises: Stretches   Active Hamstring Stretch  Both;3 reps;30 seconds    Other Knee/Hip Stretches  slant board 3x30"       Knee/Hip Exercises: Standing   Forward Lunges  Both;15 reps    Lateral Step Up  Both;Hand Hold: 0;10 reps;1 set;Step Height: 6"    Forward Step Up  Both;Hand Hold: 0;10 reps;Step Height: 6";1 set    Step Down  Both;Hand Hold: 0;1 set;10 reps;Step Height: 6"    Functional Squat  15 reps    Stairs  5 RT reciprocally with 1 HR    SLS  10x5sec bilat    Gait Training  amb without AD 452 feet      Knee/Hip Exercises: Seated   Sit to Sand  15 reps;without UE support      Knee/Hip Exercises: Supine   Bridges  15 reps    Straight Leg Raises  Both    Straight Leg Raise with External Rotation  10 reps    Other Supine Knee/Hip Exercises  toe taps x 15     Other Supine Knee/Hip Exercises  abdominal physioball:  rectus and obliques 10 reps each               PT Short Term Goals - 07/01/17 1418      PT SHORT TERM GOAL #1   Title  Pt knee extension in B knees to be 3 or less degrees to allow heel toe gait pattern.     Time  2    Period  Weeks    Status  Achieved      PT SHORT TERM GOAL #2   Title  PT to be walking inside his home without an assistive device     Time  2    Period  Weeks     Status  Partially Met      PT SHORT TERM GOAL #3   Title  PT knee flexion to be 125 B to allow pt to sit for an hour without increased pain for comfort when eating in a restaurant and traveling     Baseline  LT knee 128; RT 122 tested 07/01/2017    Time  2    Period  Weeks    Status  Partially Met      PT SHORT TERM GOAL #4   Title  PT to be walking as an exercise at home and to have worked up to walking for 15 mintues without resting     Time  2    Period  Weeks    Status  Not Met      PT SHORT TERM GOAL #5   Title  Pt to be completing back HEP to decrease pain to allow pt to focus on knee rehab     Time  2    Period  Weeks    Status  Achieved      PT SHORT TERM GOAL #6   Title  Pt Low back pain to be no greater than a 5/10 to allow pt to complete his knee exercises.     Time  2    Period  Weeks    Status  Achieved        PT Long Term Goals - 07/01/17 1421      PT LONG TERM GOAL #1   Title  PT LE strength to be increased one grade to allowpt to go up and down steps in a reciprocal manner.     Time  4    Period  Weeks    Status  Achieved      PT LONG TERM GOAL #2   Title  Pt pain level to be no greater than 2/10 in either knee to allow pt to sleep for 6 hours without waking.     Time  4  Period  Weeks    Status  Partially Met      PT LONG TERM GOAL #3   Title  PT to be walking without any assistive device.     Time  4    Period  Weeks    Status  On-going      PT LONG TERM GOAL #4   Title  PT to be able to walk 600 ft in a 3 minute time period for improved cadance for community ambulation.     Baseline  412 in 3'     Time  4    Period  Weeks    Status  On-going      PT LONG TERM GOAL #5   Title  Pt to have returned to the gym to work out.     Time  4    Period  Weeks    Status  On-going      PT LONG TERM GOAL #6   Title  Pt low back pan to decrease to no greater than a 1/10 to allow pt to sleep throughout the night     Time  4    Period  Weeks     Status  On-going      PT LONG TERM GOAL #7   Title  PT low back mobility to improve to allow pt to reach the floor to pick items up without increased discomfort     Time  4    Period  Weeks    Status  On-going      PT LONG TERM GOAL #8   Title  PT to no longer have mm spasm in Left quadratus lumborum     Time  4    Period  Weeks    Status  On-going            Plan - 07/10/17 0941    Clinical Impression Statement  Pt has progressed well with no extreme limitations at this time.  Began stair negotiation with abiltity to complete 7" step height reciprocally with use of 1 HR.  discussed other funtional limitations with patient and unable to present any limiting activity at this time.  AROM Lt 0132 and Rt 0-121 today.  Pt could not go any further than 121 on Rt today due to recurring hamstring cramp at that range.  Pt believes may be due to increasing actvitiy.  Pt to return one more appt for discharge.      Rehab Potential  Good    PT Frequency  3x / week    PT Duration  4 weeks    PT Treatment/Interventions  ADLs/Self Care Home Management;Therapeutic exercise;Balance training;Manual techniques;Patient/family education;Stair training;Gait training;Therapeutic activities    PT Next Visit Plan  Re-evaluate for discharge next session.     PT Home Exercise Plan  Eval:  Rt LAQ, heel raises, SLS, quad set and heelslides.     Consulted and Agree with Plan of Care  Patient;Family member/caregiver       Patient will benefit from skilled therapeutic intervention in order to improve the following deficits and impairments:  Abnormal gait, Decreased activity tolerance, Decreased balance, Decreased range of motion, Decreased mobility, Decreased strength, Difficulty walking, Increased edema, Pain, Impaired flexibility, Improper body mechanics, Postural dysfunction  Visit Diagnosis: Acute pain of right knee  Difficulty in walking, not elsewhere classified  Acute pain of left knee  Acute  left-sided low back pain without sciatica     Problem List Patient Active Problem List  Diagnosis Date Noted  . Obese 05/22/2017  . S/P bilateral TKAs 05/20/2017  . Heart block AV second degree-nocturnal 06/11/2015  . Tobacco use 01/25/2014  . OSA on CPAP 01/25/2014  . Chest pain 01/09/2014  . Atrial fibrillation ([place]) 01/09/2014  . HTN (hypertension) 01/09/2014  . GERD (gastroesophageal reflux disease) 01/09/2014   Teena Irani, PTA/CLT [phone]  Teena Irani 07/10/2017, 9:48 AM  [place] [address] [place], [place], [address] Phone: [phone]   Fax:  [phone]  Name: ACIE CUSTIS MRN: [id number] Date of Birth: 09-18-1960

## 2017-07-12 ENCOUNTER — Ambulatory Visit (HOSPITAL_COMMUNITY): Payer: BLUE CROSS/BLUE SHIELD

## 2017-07-12 DIAGNOSIS — M545 Low back pain, unspecified: Secondary | ICD-10-CM

## 2017-07-12 DIAGNOSIS — M25561 Pain in right knee: Secondary | ICD-10-CM | POA: Diagnosis not present

## 2017-07-12 DIAGNOSIS — R262 Difficulty in walking, not elsewhere classified: Secondary | ICD-10-CM | POA: Diagnosis not present

## 2017-07-12 DIAGNOSIS — M25562 Pain in left knee: Secondary | ICD-10-CM

## 2017-07-12 NOTE — Therapy (Signed)
PHYSICAL THERAPY DISCHARGE SUMMARY  Visits from Start of Care: 13  Current functional level related to goals / functional outcomes: *see below   Remaining deficits: *see below   Education / Equipment: *see below Plan: Patient agrees to discharge.  Patient goals were met. Patient is being discharged due to meeting the stated rehab goals.  ?????         10:12 AM, 07/12/17 Etta Grandchild, PT, DPT Physical Therapist - [place] [phone] (Office)           [facility] [address] [place], [place], [address] Phone: [phone]   Fax:  [phone]  Physical Therapy Treatment  Patient Details  Name: Sean Morales MRN: [id number] Date of Birth: Apr 01, 1961 Referring Provider: [name]   Encounter Date: 07/12/2017  PT End of Session - 07/12/17 1003    Visit Number  13    Number of Visits  13    Authorization Type  BCBS    Authorization Time Period  06/21/17-4/19 (reassessment 3/29)     PT Start Time  0901    PT Stop Time  1009    PT Time Calculation (min)  68 min    Activity Tolerance  Patient tolerated treatment well;No increased pain    Behavior During Therapy  WFL for tasks assessed/performed       Past Medical History:  Diagnosis Date  . Bilateral knee pain   . Depression    no current meds  . DJD (degenerative joint disease)   . First degree heart block   . GERD (gastroesophageal reflux disease)   . Hypertension   . OA (osteoarthritis) of knee    bilateral  . Obstructive sleep apnea   . Persistent atrial fibrillation ([place]) first dx 10/ 2015-- previous primary cardiologist, [name]   followed by atrial fib. clinic-- [name] NP    Past Surgical History:  Procedure Laterality Date  . APPENDECTOMY  age 61  . CARDIOVASCULAR STRESS TEST  01-17-2015  [name]   Low risk nuclear study w/ no ischemia/  normal LV function and wal motion,  nuclear stress ef  60%  . CARDIOVERSION N/A 01/20/2015   Procedure: CARDIOVERSION;  Surgeon: [name], MD;  Location: [facility];  Service: Cardiovascular;  Laterality: N/A;  . colonscopy  age 36  . cysts removed from nipples  in high school   and neck  . ORIF RIGHT KNEE  age 42  . TONSILLECTOMY  age 62  . TOTAL KNEE ARTHROPLASTY Bilateral 05/20/2017   Procedure: TOTAL KNEE BILATERAL;  Surgeon: [name], MD;  Location: [facility];  Service: Orthopedics;  Laterality: Bilateral;  Bilateral Adductor Block  . TRANSTHORACIC ECHOCARDIOGRAM  03/29/2017   ef 60-65%/  trivial AR, MR, and TR/  mild LAE/ moderate RAE/  PASP 69mHg    There were no vitals filed for this visit.  Subjective Assessment - 07/12/17 0910    Subjective  Pt says he is doign well overall. [facility]. He says knees remain stiff but improve throughout the day.  He goes back to work on 07/22/17. He reports to feel ready for DC at this time.     Pertinent History  OA, B TKR; chronic back pain    How long can you sit comfortably?  45-60 minuttes was 30 minutes (1WA)     How long can you stand comfortably?  20 minutes 15 minutes (1WA)     Currently in Pain?  No/denies stiffness         [facility] PT Assessment - 07/12/17 0001      Assessment   Medical Diagnosis  B TKR    Referring Provider  [name]    Onset Date/Surgical Date  05/20/17    Next MD Visit  07/03/2017      Precautions   Precautions  None      Restrictions   Weight Bearing Restrictions  No      Prior Function   Level of Independence  Independent    Vocation  Full time employment      Observation/Other Assessments   Focus on Therapeutic Outcomes (FOTO)   FOTO: 28, previously 8, 34      Sit to Stand   Comments  5xSTS: 10.08sec 5x-35.92sec      Transfers   Five time sit to stand comments   10.08sec, arms at chest, standard chair      Ambulation/Gait   Gait velocity  0.59ms 0.734m on 4/8, 0.57 on 3/29           [facility] - 07[facility]17 0001      Ambulation/Gait   Ambulation Distance (Feet)  675 Feet      Knee/Hip Exercises: Stretches   Other Knee/Hip Stretches  AA/ROM, Nustep 10 minutes, level 3, seat 10, arms 13      Knee/Hip Exercises: Machines for Strengthening   Cybex Knee Extension  1x10 bilat with 2 plates      Knee/Hip Exercises: Standing   Other Standing Knee Exercises  deadlift with loaded box: 30lbs  1x30    Other Standing Knee Exercises  lateral band walks: green side step: 1x2580filat      Knee/Hip Exercises: Supine   Other Supine Knee/Hip Exercises  reverse curl up: 10x3secH    Other Supine Knee/Hip Exercises  hamstrings bridge isometrics: 10x3secH         PT Short Term Goals - 07/12/17 0924      PT SHORT TERM GOAL #1   Title  Pt knee extension in B knees to be 3 or less degrees to allow heel toe gait pattern.     Time  2    Period  Weeks    Status  Achieved      PT SHORT TERM GOAL #2   Title  PT to be walking inside his home without an assistive device     Time  2    Period  Weeks    Status  Achieved      PT SHORT TERM GOAL #3   Title  PT knee flexion to be 125 B to allow pt to sit for an hour without increased pain for comfort when eating in a restaurant and traveling     Baseline  LT knee 128; RT 122 tested 07/01/2017    Time  2    Period  Weeks    Status  Achieved      PT SHORT TERM GOAL #4   Title  PT to be walking as an exercise at home and to have worked up to walking for 15 mintues without resting     Baseline  as of DC walking 10 minutes at a time.      Time  2    Period  Weeks    Status  On-going      PT SHORT TERM GOAL #5   Title  Pt  to be completing back HEP to decrease pain to allow pt to focus on knee rehab     Time  2    Period  Weeks    Status  Achieved      PT SHORT TERM GOAL #6   Title  Pt Low back pain to be no greater than a 5/10 to allow pt to complete his knee exercises.     Time  2    Period  Weeks     Status  Achieved        PT Long Term Goals - 07/12/17 0926      PT LONG TERM GOAL #1   Title  PT LE strength to be increased one grade to allowpt to go up and down steps in a reciprocal manner.     Time  4    Period  Weeks    Status  Achieved      PT LONG TERM GOAL #2   Title  Pt pain level to be no greater than 2/10 in either knee to allow pt to sleep for 6 hours without waking.     Time  4    Period  Weeks    Status  Achieved      PT LONG TERM GOAL #3   Title  PT to be walking without any assistive device.     Time  4    Period  Weeks    Status  Achieved      PT LONG TERM GOAL #4   Title  PT to be able to walk 600 ft in a 3 minute time period for improved cadance for community ambulation.     Baseline  improvinmg but not at goal     Time  4    Period  Weeks    Status  On-going      PT LONG TERM GOAL #5   Title  Pt to have returned to the gym to work out.     Time  4    Period  Weeks    Status  Achieved      PT LONG TERM GOAL #6   Title  Pt low back pan to decrease to no greater than a 1/10 to allow pt to sleep throughout the night     Time  4    Period  Weeks    Status  Achieved      PT LONG TERM GOAL #7   Title  PT low back mobility to improve to allow pt to reach the floor to pick items up without increased discomfort.    Time  4    Period  Weeks    Status  Achieved      PT LONG TERM GOAL #8   Title  PT to no longer have mm spasm in Left quadratus lumborum     Time  4    Period  Weeks    Status  Achieved            Plan - 07/12/17 1004    Clinical Impression Statement  TeEvaluation completed this date. All goals adequately complete for DC. Pt is educated on independent performance of new HEP for next 6 months. All fucntional mobility continues to progress, now safe adn independent. Pt is ready for DC and agreeable. Today is his last session.     Clinical Presentation  Stable    Clinical Decision Making  Moderate    Rehab Potential  Good      PT  Frequency  3x / week    PT Duration  4 weeks    PT Treatment/Interventions  ADLs/Self Care Home Management;Therapeutic exercise;Balance training;Manual techniques;Patient/family education;Stair training;Gait training;Therapeutic activities    PT Next Visit Plan  Pt no Discharged    Consulted and Agree with Plan of Care  Patient       Patient will benefit from skilled therapeutic intervention in order to improve the following deficits and impairments:  Abnormal gait, Decreased activity tolerance, Decreased balance, Decreased range of motion, Decreased mobility, Decreased strength, Difficulty walking, Increased edema, Pain, Impaired flexibility, Improper body mechanics, Postural dysfunction  Visit Diagnosis: Acute pain of right knee  Difficulty in walking, not elsewhere classified  Acute pain of left knee  Acute left-sided low back pain without sciatica     Problem List Patient Active Problem List   Diagnosis Date Noted  . Obese 05/22/2017  . S/P bilateral TKAs 05/20/2017  . Heart block AV second degree-nocturnal 06/11/2015  . Tobacco use 01/25/2014  . OSA on CPAP 01/25/2014  . Chest pain 01/09/2014  . Atrial fibrillation (H[facility]) 01/09/2014  . HTN (hypertension) 01/09/2014  . GERD (gastroesophageal reflux disease) 01/09/2014   10:12 AM, 07/12/17 Allan C Buccola, PT, DPT Physical Therapist - [place] [phone] (ASCOM)  [phone] (Office)    Buccola,Allan C 07/12/2017, 10:11 AM  [facility] [address] [place], [place], [address] Phone: [phone]   Fax:  [phone]  Name: Daley E Stallone MRN: [id number] Date of Birth: 09/09/1960   

## 2017-07-16 DIAGNOSIS — M545 Low back pain: Secondary | ICD-10-CM | POA: Diagnosis not present

## 2017-07-17 ENCOUNTER — Encounter: Payer: Self-pay | Admitting: Nurse Practitioner

## 2017-07-17 ENCOUNTER — Ambulatory Visit (INDEPENDENT_AMBULATORY_CARE_PROVIDER_SITE_OTHER): Payer: BLUE CROSS/BLUE SHIELD | Admitting: Nurse Practitioner

## 2017-07-17 ENCOUNTER — Telehealth: Payer: Self-pay

## 2017-07-17 DIAGNOSIS — Z7901 Long term (current) use of anticoagulants: Secondary | ICD-10-CM | POA: Diagnosis not present

## 2017-07-17 DIAGNOSIS — R197 Diarrhea, unspecified: Secondary | ICD-10-CM

## 2017-07-17 DIAGNOSIS — Z79899 Other long term (current) drug therapy: Secondary | ICD-10-CM | POA: Insufficient documentation

## 2017-07-17 NOTE — Assessment & Plan Note (Signed)
Intermittent/rare diarrhea that improved significantly with dietary changes and weight loss.  This is likely diet triggered.  He can try Imodium over-the-counter as needed.  Follow-up based on post procedure recommendations.  He can call us if he has worsening symptoms and would like to follow-up on his diarrhea.

## 2017-07-17 NOTE — Telephone Encounter (Signed)
TO: Sherran Needs, NP  Butch Penny, this mutual pt was seen in our office today by Walden Field, NP. Randall Hiss would like for him to have a colonoscopy in the very near future with Dr. Gala Romney. Please advise if pt may hold Xarelto x 48 hours prior to procedure.  He said he has an appt with you on 07/18/2017. Thanks so much!   Manraj Yeo H. Nicole Kindred, LPN

## 2017-07-17 NOTE — Patient Instructions (Signed)
1. We will schedule your procedure for you. 2. We will contact the A. fib clinic to get permission to hold your Xarelto prior to your procedure. 3. Further recommendations will be made after your colonoscopy. 4. Follow-up based on recommendations made after your colonoscopy. 5. Call if you have any questions or concerns.   It was great meeting you today!  I hope you have a great summer!!    At [facility] we value your feedback. You may receive a survey about your visit today. Please share your experience as we strive to create trusting relationships with our patients to provide genuine, compassionate, quality care.

## 2017-07-17 NOTE — Assessment & Plan Note (Addendum)
Patient is on chronic anticoagulation for atrial fibrillation on Xarelto.  He was converted to sinus rhythm on Tikosyn.  We will send a message to the A. fib clinic to request okay to hold Xarelto for 48 hours.  Tentatively plan to schedule colonoscopy for screening exam..  Proceed with TCS with Dr. Gala Romney in near future: the risks, benefits, and alternatives have been discussed with the patient in detail. The patient states understanding and desires to proceed.  The patient is currently on Xarelto.  No other anticoagulants, anxiolytics, chronic pain medications, or antidepressants.  Rare alcohol use, denies drug use.  Conscious sedation should be adequate for his procedure.

## 2017-07-17 NOTE — Progress Notes (Signed)
Primary Care Physician:  Sharilyn Sites, MD Primary Gastroenterologist:  Dr. Gala Romney  Chief Complaint  Patient presents with  . Colonoscopy    consult    HPI:   Sean Morales is a 57 y.o. male who presents referral from primary care for scheduling of colonoscopy.  Phone triage/nurse triage was deferred to office visit due to currently on Xarelto.  Previously saw [facility] in 2001.  Colonoscopy was completed at that time for heme positive stool.  He did suboptimal right colon prep, small internal hemorrhoids.  Hematochezia likely due to hemorrhoids, proceed with upper endoscopy.  EGD completed the same day for heme positive stool found LA grade a distal erosive esophagitis, sliding hiatal hernia, abnormal distal esophageal cells status post biopsy.  Surgical pathology found the esophageal biopsies to be unremarkable squamous and gastric mucosa without intestinal metaplasia, dysplasia, or malignancy.  Recommended strict antireflux measures and Prevacid 30 mg twice daily.  Noted history of persistent atrial fibrillation.  No past medical history of coronary artery disease or myocardial infarction.  Today he states he's doing well overall. Rare diarrhea about once a week, has 2-3 stools, then self-resolves. No known triggers. Did have some resolution last year with healthier diet and associated weight loss. Denies abdominal pain, N/V, fever, chills, unintentional weight loss. Is on Xarelto for AFib which is managed by Dr. Rayann Heman at [facility]. Sees the AFib clinic tomorrow. Denies chest pain, dyspnea, dizziness, lightheadedness, syncope, near syncope. Denies any other upper or lower GI symptoms.  Past Medical History:  Diagnosis Date  . Bilateral knee pain   . Depression    no current meds  . DJD (degenerative joint disease)   . First degree heart block   . GERD (gastroesophageal reflux disease)   . Hypertension   . OA (osteoarthritis) of knee    bilateral  . Obstructive sleep  apnea   . Persistent atrial fibrillation ([place]) first dx 10/ 2015-- previous primary cardiologist, [name]   followed by atrial fib. clinic-- [name] NP    Past Surgical History:  Procedure Laterality Date  . APPENDECTOMY  age 51  . CARDIOVASCULAR STRESS TEST  01-17-2015  [name]   Low risk nuclear study w/ no ischemia/  normal LV function and wal motion,  nuclear stress ef 60%  . CARDIOVERSION N/A 01/20/2015   Procedure: CARDIOVERSION;  Surgeon: [name], MD;  Location: [facility];  Service: Cardiovascular;  Laterality: N/A;  . colonscopy  age 72  . cysts removed from nipples  in high school   and neck  . ORIF RIGHT KNEE  age 68  . TONSILLECTOMY  age 58  . TOTAL KNEE ARTHROPLASTY Bilateral 05/20/2017   Procedure: TOTAL KNEE BILATERAL;  Surgeon: [name], MD;  Location: [facility];  Service: Orthopedics;  Laterality: Bilateral;  Bilateral Adductor Block  . TRANSTHORACIC ECHOCARDIOGRAM  03/29/2017   ef 60-65%/  trivial AR, MR, and TR/  mild LAE/ moderate RAE/  PASP 81mmHg    Current Outpatient Medications  Medication Sig Dispense Refill  . amLODipine (NORVASC) 10 MG tablet Take 1 tablet (10 mg total) by mouth daily. (Patient taking differently: Take 10 mg by mouth every evening. )    . dofetilide (TIKOSYN) 500 MCG capsule TAKE ONE CAPSULE BY MOUTH TWICE A DAY 180 capsule 1  . losartan (COZAAR) 100 MG tablet Take 100 mg by mouth daily.     . Magnesium 250 MG TABS Take 250 mg by mouth daily.     Marland Kitchen  methocarbamol (ROBAXIN) 500 MG tablet Take 1 tablet (500 mg total) by mouth every 6 (six) hours as needed for muscle spasms. 40 tablet 0  . metoprolol tartrate (LOPRESSOR) 25 MG tablet TAKE 1 TABLET (25 MG TOTAL) BY MOUTH 2 (TWO) TIMES DAILY. 180 tablet 0  . omeprazole (PRILOSEC) 40 MG capsule Take 40 mg by mouth daily.     . rivaroxaban (XARELTO) 20 MG TABS tablet TAKE 1 TABLET BY MOUTH EVERY DAY WITH DINNER (Patient taking differently: Take 20 mg by mouth every evening.  ) 90 tablet 3   No current facility-administered medications for this visit.     Allergies as of 07/17/2017 - Review Complete 07/17/2017  Allergen Reaction Noted  . Bee venom Anaphylaxis 01/17/2015  . Other  01/17/2015  . Penicillins Other (See Comments) 01/09/2014  . Sulfa antibiotics Other (See Comments) 01/09/2014  . Tetracyclines & related Other (See Comments) 01/09/2014    Family History  Problem Relation Age of Onset  . Deep vein thrombosis Father   . Stroke Mother   . Colon cancer Neg Hx     Social History   Socioeconomic History  . Marital status: Married    Spouse name: [place]  . Number of children: 4  . Years of education: 54  . Highest education level: Not on file  Occupational History    Comment: [employer]  Social Needs  . Financial resource strain: Not on file  . Food insecurity:    Worry: Not on file    Inability: Not on file  . Transportation needs:    Medical: Not on file    Non-medical: Not on file  Tobacco Use  . Smoking status: Current Every Day Smoker    Packs/day: 0.50    Years: 40.00    Pack years: 20.00    Types: Cigarettes    Start date: 01/08/1977  . Smokeless tobacco: Never Used  Substance and Sexual Activity  . Alcohol use: Yes    Alcohol/week: 0.0 oz    Comment: rarely  . Drug use: No  . Sexual activity: Yes  Lifestyle  . Physical activity:    Days per week: Not on file    Minutes per session: Not on file  . Stress: Not on file  Relationships  . Social connections:    Talks on phone: Not on file    Gets together: Not on file    Attends religious service: Not on file    Active member of club or organization: Not on file    Attends meetings of clubs or organizations: Not on file    Relationship status: Not on file  . Intimate partner violence:    Fear of current or ex partner: Not on file    Emotionally abused: Not on file    Physically abused: Not on file    Forced sexual activity: Not on file  Other Topics  Concern  . Not on file  Social History Narrative   Lives in [place] (near [place]) with wife.   Patient 1 cup of coffee daily,occas. Soft drink   Technical sales engineer for [employer]    Review of Systems: General: Negative for anorexia, weight loss, fever, chills, fatigue, weakness. ENT: Negative for hoarseness, difficulty swallowing , nasal congestion. CV: Negative for chest pain, angina, palpitations, peripheral edema.  Respiratory: Negative for dyspnea at rest, cough, sputum, wheezing.  GI: See history of present illness. MS: Admits recent knee replacements.  Derm: Negative for rash or itching.  Endo:  Negative for unusual weight change.  Heme: Negative for bruising or bleeding. Allergy: Negative for rash or hives.    Physical Exam: BP 125/83   Pulse 73   Temp 98.5 F (36.9 C) (Oral)   Ht 6' (1.829 m)   Wt 271 lb 12.8 oz (123.3 kg)   BMI 36.86 kg/m  General:   Alert and oriented. Pleasant and cooperative. Well-nourished and well-developed.  Head:  Normocephalic and atraumatic. Eyes:  Without icterus, sclera clear and conjunctiva pink.  Ears:  Normal auditory acuity. Mouth:  No deformity or lesions, oral mucosa pink.  Throat/Neck:  Supple, without mass or thyromegaly. Cardiovascular:  S1, S2 present without murmurs appreciated. Extremities without clubbing or edema. Respiratory:  Clear to auscultation bilaterally. No wheezes, rales, or rhonchi. No distress.  Gastrointestinal:  +BS, soft, non-tender and non-distended. No HSM noted. No guarding or rebound. No masses appreciated.  Rectal:  Deferred  Musculoskalatal:  Symmetrical without gross deformities. Normal posture. Neurologic:  Alert and oriented x4;  grossly normal neurologically. Psych:  Alert and cooperative. Normal mood and affect. Heme/Lymph/Immune: No excessive bruising noted.    07/17/2017 3:40 PM   Disclaimer: This note was dictated with voice recognition software. Similar sounding words can  inadvertently be transcribed and may not be corrected upon review.

## 2017-07-18 ENCOUNTER — Ambulatory Visit (HOSPITAL_COMMUNITY)
Admission: RE | Admit: 2017-07-18 | Discharge: 2017-07-18 | Disposition: A | Discharge status: Home / Self Care | Payer: BLUE CROSS/BLUE SHIELD | Source: Ambulatory Visit | Attending: Nurse Practitioner | Admitting: Nurse Practitioner

## 2017-07-18 ENCOUNTER — Encounter (HOSPITAL_COMMUNITY): Payer: Self-pay | Admitting: Nurse Practitioner

## 2017-07-18 VITALS — BP 136/82 | HR 63 | Ht 72.0 in | Wt 272.6 lb

## 2017-07-18 DIAGNOSIS — I4819 Other persistent atrial fibrillation: Secondary | ICD-10-CM

## 2017-07-18 DIAGNOSIS — K219 Gastro-esophageal reflux disease without esophagitis: Secondary | ICD-10-CM | POA: Insufficient documentation

## 2017-07-18 DIAGNOSIS — G4733 Obstructive sleep apnea (adult) (pediatric): Secondary | ICD-10-CM | POA: Diagnosis not present

## 2017-07-18 DIAGNOSIS — I10 Essential (primary) hypertension: Secondary | ICD-10-CM | POA: Insufficient documentation

## 2017-07-18 DIAGNOSIS — F329 Major depressive disorder, single episode, unspecified: Secondary | ICD-10-CM | POA: Insufficient documentation

## 2017-07-18 DIAGNOSIS — I481 Persistent atrial fibrillation: Secondary | ICD-10-CM | POA: Diagnosis not present

## 2017-07-18 DIAGNOSIS — Z79899 Other long term (current) drug therapy: Secondary | ICD-10-CM | POA: Insufficient documentation

## 2017-07-18 DIAGNOSIS — M25561 Pain in right knee: Secondary | ICD-10-CM | POA: Diagnosis not present

## 2017-07-18 DIAGNOSIS — Z882 Allergy status to sulfonamides status: Secondary | ICD-10-CM | POA: Diagnosis not present

## 2017-07-18 DIAGNOSIS — M25562 Pain in left knee: Secondary | ICD-10-CM | POA: Diagnosis not present

## 2017-07-18 DIAGNOSIS — F17210 Nicotine dependence, cigarettes, uncomplicated: Secondary | ICD-10-CM | POA: Insufficient documentation

## 2017-07-18 DIAGNOSIS — I440 Atrioventricular block, first degree: Secondary | ICD-10-CM | POA: Diagnosis not present

## 2017-07-18 DIAGNOSIS — E669 Obesity, unspecified: Secondary | ICD-10-CM | POA: Diagnosis not present

## 2017-07-18 DIAGNOSIS — Z880 Allergy status to penicillin: Secondary | ICD-10-CM | POA: Insufficient documentation

## 2017-07-18 DIAGNOSIS — Z7901 Long term (current) use of anticoagulants: Secondary | ICD-10-CM | POA: Diagnosis not present

## 2017-07-18 DIAGNOSIS — Z96653 Presence of artificial knee joint, bilateral: Secondary | ICD-10-CM | POA: Diagnosis not present

## 2017-07-18 LAB — MAGNESIUM: Magnesium: 2.2 mg/dL (ref 1.7–2.4)

## 2017-07-18 LAB — BASIC METABOLIC PANEL
ANION GAP: 8 (ref 5–15)
BUN: 12 mg/dL (ref 6–20)
CALCIUM: 8.9 mg/dL (ref 8.9–10.3)
CO2: 27 mmol/L (ref 22–32)
CREATININE: 0.85 mg/dL (ref 0.61–1.24)
Chloride: 105 mmol/L (ref 101–111)
GLUCOSE: 81 mg/dL (ref 65–99)
Potassium: 4.1 mmol/L (ref 3.5–5.1)
Sodium: 140 mmol/L (ref 135–145)

## 2017-07-18 NOTE — Progress Notes (Signed)
CC'D TO PCP °

## 2017-07-18 NOTE — Progress Notes (Signed)
Patient ID: Sean Morales, male   DOB: January 19, 1961, 57 y.o.   MRN: [id number]     Primary Care Physician: Sharilyn Sites, MD Referring Physician: [facility] f/u EP: Dr. Merlene Morse is a 57 y.o. male with a h/o persistent afib on tikosyn therapy. He has not had any significant afib.He feels improved. No bleeding issues with xarelto.  F/u in afib clinic, 8/24, he feels well. He is starting to be more active because his knees are not bothering him as much and has lost 30 lbs and hopes to lose more. He and his wife have joined the [employer] and he hopes to be more active. Continues on tikosyn and xarelto without any afib or bleeding issues.  F/u in afib clinic 03/19/17. He has not noted any afib.Continues on tikosyn. He has lost around 50- 60 lbs by his scales, weight  this am was 250 lbs before dressing. He is continuing to have knee issues and is expecting to have knee surgery in the next couple of months. He may be interested in an ablation after knee surgery is complete with maintaining SR and reduction  in weight. Will update echo. Continues on xarelto 20 mg daily with a chadsvasc score of 1. Is  using cpap.  F/u in the afib clinic, 4/18. Since I saw him last, he has had both knees replaced. He did well with the surgery and will be slowly getting back to his work/ exercise routine. He is looking at having a colonoscopy in the next few weeks. No afib during the surgery or recovery process. He continues on Tikosyn and  xarelto.  Today, he denies symptoms  of shortness of breath, orthopnea, PND, lower extremity edema, dizziness, presyncope, syncope, or neurologic sequela.Positive of symptoms as  listed above.The patient is tolerating medications without difficulties and is otherwise without complaint today.   Past Medical History:  Diagnosis Date  . Bilateral knee pain   . Depression    no current meds  . DJD (degenerative joint disease)   . First degree heart block   . GERD  (gastroesophageal reflux disease)   . Hypertension   . OA (osteoarthritis) of knee    bilateral  . Obstructive sleep apnea   . Persistent atrial fibrillation ([place]) first dx 10/ 2015-- previous primary cardiologist, [name]   followed by atrial fib. clinic-- [name] NP   Past Surgical History:  Procedure Laterality Date  . APPENDECTOMY  age 2  . CARDIOVASCULAR STRESS TEST  01-17-2015  [name]   Low risk nuclear study w/ no ischemia/  normal LV function and wal motion,  nuclear stress ef 60%  . CARDIOVERSION N/A 01/20/2015   Procedure: CARDIOVERSION;  Surgeon: [name], MD;  Location: [facility];  Service: Cardiovascular;  Laterality: N/A;  . colonscopy  age 61  . cysts removed from nipples  in high school   and neck  . ORIF RIGHT KNEE  age 65  . TONSILLECTOMY  age 55  . TOTAL KNEE ARTHROPLASTY Bilateral 05/20/2017   Procedure: TOTAL KNEE BILATERAL;  Surgeon: [name], MD;  Location: [facility];  Service: Orthopedics;  Laterality: Bilateral;  Bilateral Adductor Block  . TRANSTHORACIC ECHOCARDIOGRAM  03/29/2017   ef 60-65%/  trivial AR, MR, and TR/  mild LAE/ moderate RAE/  PASP 56mmHg    Current Outpatient Medications  Medication Sig Dispense Refill  . amLODipine (NORVASC) 10 MG tablet Take 1 tablet (10 mg total) by mouth daily. (Patient taking differently:  Take 10 mg by mouth every evening. )    . dofetilide (TIKOSYN) 500 MCG capsule TAKE ONE CAPSULE BY MOUTH TWICE A DAY 180 capsule 1  . losartan (COZAAR) 100 MG tablet Take 100 mg by mouth daily.     . Magnesium 250 MG TABS Take 250 mg by mouth daily.     . methocarbamol (ROBAXIN) 500 MG tablet Take 1 tablet (500 mg total) by mouth every 6 (six) hours as needed for muscle spasms. 40 tablet 0  . metoprolol tartrate (LOPRESSOR) 25 MG tablet TAKE 1 TABLET (25 MG TOTAL) BY MOUTH 2 (TWO) TIMES DAILY. 180 tablet 0  . omeprazole (PRILOSEC) 40 MG capsule Take 40 mg by mouth daily.     . rivaroxaban (XARELTO) 20 MG  TABS tablet TAKE 1 TABLET BY MOUTH EVERY DAY WITH DINNER (Patient taking differently: Take 20 mg by mouth every evening. ) 90 tablet 3   No current facility-administered medications for this encounter.     Allergies  Allergen Reactions  . Bee Venom Anaphylaxis    [place]  . Other     Copyronil per patient not sure  . Penicillins Other (See Comments)    Unknown. Childhood allergy. Has patient had a PCN reaction causing immediate rash, facial/tongue/throat swelling, SOB or lightheadedness with hypotension: Unknown Has patient had a PCN reaction causing severe rash involving mucus membranes or skin necrosis: Unknown Has patient had a PCN reaction that required hospitalization: Unknown Has patient had a PCN reaction occurring within the last 10 years: No If all of the above answers are "NO", then may proceed with Cephalosporin use.   . Sulfa Antibiotics Other (See Comments)    Unknown. Childhood allergy.  . Tetracyclines & Related Other (See Comments)    Unknown. Childhood allergy.    Social History   Socioeconomic History  . Marital status: Married    Spouse name: [place]  . Number of children: 4  . Years of education: 80  . Highest education level: Not on file  Occupational History    Comment: [employer]  Social Needs  . Financial resource strain: Not on file  . Food insecurity:    Worry: Not on file    Inability: Not on file  . Transportation needs:    Medical: Not on file    Non-medical: Not on file  Tobacco Use  . Smoking status: Current Every Day Smoker    Packs/day: 0.50    Years: 40.00    Pack years: 20.00    Types: Cigarettes    Start date: 01/08/1977  . Smokeless tobacco: Never Used  Substance and Sexual Activity  . Alcohol use: Yes    Alcohol/week: 0.0 oz    Comment: rarely  . Drug use: No  . Sexual activity: Yes  Lifestyle  . Physical activity:    Days per week: Not on file    Minutes per session: Not on file  . Stress: Not on file    Relationships  . Social connections:    Talks on phone: Not on file    Gets together: Not on file    Attends religious service: Not on file    Active member of club or organization: Not on file    Attends meetings of clubs or organizations: Not on file    Relationship status: Not on file  . Intimate partner violence:    Fear of current or ex partner: Not on file    Emotionally abused: Not on file  Physically abused: Not on file    Forced sexual activity: Not on file  Other Topics Concern  . Not on file  Social History Narrative   Lives in [place] (near [place]) with wife.   Patient 1 cup of coffee daily,occas. 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    Family History  Problem Relation Age of Onset  . Deep vein thrombosis Father   . Stroke Mother   . Colon cancer Neg Hx     ROS- All systems are reviewed and negative except as per the HPI above  Physical Exam: Vitals:   07/18/17 0955  BP: 136/82  Pulse: 63  Weight: 272 lb 9.6 oz (123.7 kg)  Height: 6' (1.829 m)    GEN- The patient is well appearing, alert and oriented x 3 today.   Head- normocephalic, atraumatic Eyes-  Sclera clear, conjunctiva pink Ears- hearing intact Oropharynx- clear Neck- supple, no JVP Lymph- no cervical lymphadenopathy Lungs- Clear to ausculation bilaterally, normal work of breathing Heart- Regular rate and rhythm, no murmurs, rubs or gallops, PMI not laterally displaced GI- soft, NT, ND, + BS Extremities- no clubbing, cyanosis, or edema, surgical scars present both knees MS- no significant deformity or atrophy Skin- no rash or lesion Psych- euthymic mood, full affect Neuro- strength and sensation are intact  EKG- Sinus rhythm with first degree AV block, pr int 298 ms, qrs int 84 ms, qtc 440 ms Event monitor results reviewed  Assessment and Plan: 1. Persistent afib Doing well on tikosyn, staying in SR  Continue tikosyn 500 mcg bid Continue metoprolol Continue  xarelto for chadsvasc score of at least 1  Bmet/mag today He should be at low risk for up coming colonoscopy and can hold  xarelto x two days He should take tikosyn(dofetilide) with sip of water and not miss doses around the time of colonoscopy   2. HTN Stable  3. Bilateral knee replacement Doing well s/p surgery 05/20/17  4. Obesity Gained some weight after the procedure when he was inactive Is slowly returning to exercise program   F/u in afib clinic in 4 months  Butch Penny C. Shakila Mak, [facility] [address] [place], [place] [address] [phone]

## 2017-07-22 ENCOUNTER — Other Ambulatory Visit: Payer: Self-pay | Admitting: *Deleted

## 2017-07-22 ENCOUNTER — Telehealth: Payer: Self-pay | Admitting: *Deleted

## 2017-07-22 ENCOUNTER — Encounter: Payer: Self-pay | Admitting: *Deleted

## 2017-07-22 DIAGNOSIS — Z1211 Encounter for screening for malignant neoplasm of colon: Secondary | ICD-10-CM

## 2017-07-22 MED ORDER — PEG 3350-KCL-NA BICARB-NACL 420 G PO SOLR: 4000.0000 mL | Freq: Once | ORAL | 0 refills | Status: AC

## 2017-07-22 NOTE — Telephone Encounter (Signed)
Please see the OV note from 07/18/2017 with Roderic Palau, NP.

## 2017-07-22 NOTE — Telephone Encounter (Signed)
LMOVM

## 2017-07-22 NOTE — Telephone Encounter (Signed)
See other phone note

## 2017-07-22 NOTE — Telephone Encounter (Signed)
Spoke with patient and procedure scheduled for 09/20/17 at 1:30pm. Instructions for prep mailed. Rx for prep sent into the pharmacy. Patient aware okay to hold xarelto 48 hours prior to procedure (see prior phone note).   Contacted patient insurance and was advised no PA required for TCS.

## 2017-07-23 NOTE — Telephone Encounter (Signed)
Noted  

## 2017-07-31 DIAGNOSIS — M5136 Other intervertebral disc degeneration, lumbar region: Secondary | ICD-10-CM | POA: Diagnosis not present

## 2017-07-31 DIAGNOSIS — M545 Low back pain: Secondary | ICD-10-CM | POA: Diagnosis not present

## 2017-07-31 DIAGNOSIS — M5126 Other intervertebral disc displacement, lumbar region: Secondary | ICD-10-CM | POA: Diagnosis not present

## 2017-09-20 ENCOUNTER — Encounter (HOSPITAL_COMMUNITY): Payer: Self-pay | Admitting: *Deleted

## 2017-09-20 ENCOUNTER — Ambulatory Visit (HOSPITAL_COMMUNITY)
Admission: RE | Admit: 2017-09-20 | Discharge: 2017-09-20 | Disposition: A | Discharge status: Home / Self Care | Payer: BLUE CROSS/BLUE SHIELD | Source: Ambulatory Visit | Attending: Internal Medicine | Admitting: Internal Medicine

## 2017-09-20 ENCOUNTER — Encounter (HOSPITAL_COMMUNITY)
Admission: RE | Disposition: A | Discharge status: Home / Self Care | Payer: Self-pay | Source: Ambulatory Visit | Attending: Internal Medicine

## 2017-09-20 ENCOUNTER — Other Ambulatory Visit: Payer: Self-pay

## 2017-09-20 DIAGNOSIS — D123 Benign neoplasm of transverse colon: Secondary | ICD-10-CM | POA: Insufficient documentation

## 2017-09-20 DIAGNOSIS — Z7901 Long term (current) use of anticoagulants: Secondary | ICD-10-CM | POA: Diagnosis not present

## 2017-09-20 DIAGNOSIS — Z1212 Encounter for screening for malignant neoplasm of rectum: Secondary | ICD-10-CM | POA: Diagnosis not present

## 2017-09-20 DIAGNOSIS — Z881 Allergy status to other antibiotic agents status: Secondary | ICD-10-CM | POA: Diagnosis not present

## 2017-09-20 DIAGNOSIS — Z79899 Other long term (current) drug therapy: Secondary | ICD-10-CM | POA: Insufficient documentation

## 2017-09-20 DIAGNOSIS — Z96653 Presence of artificial knee joint, bilateral: Secondary | ICD-10-CM | POA: Diagnosis not present

## 2017-09-20 DIAGNOSIS — F17210 Nicotine dependence, cigarettes, uncomplicated: Secondary | ICD-10-CM | POA: Insufficient documentation

## 2017-09-20 DIAGNOSIS — I10 Essential (primary) hypertension: Secondary | ICD-10-CM | POA: Insufficient documentation

## 2017-09-20 DIAGNOSIS — Z880 Allergy status to penicillin: Secondary | ICD-10-CM | POA: Diagnosis not present

## 2017-09-20 DIAGNOSIS — K5730 Diverticulosis of large intestine without perforation or abscess without bleeding: Secondary | ICD-10-CM | POA: Insufficient documentation

## 2017-09-20 DIAGNOSIS — I481 Persistent atrial fibrillation: Secondary | ICD-10-CM | POA: Diagnosis not present

## 2017-09-20 DIAGNOSIS — Z882 Allergy status to sulfonamides status: Secondary | ICD-10-CM | POA: Insufficient documentation

## 2017-09-20 DIAGNOSIS — K219 Gastro-esophageal reflux disease without esophagitis: Secondary | ICD-10-CM | POA: Diagnosis not present

## 2017-09-20 DIAGNOSIS — Z91030 Bee allergy status: Secondary | ICD-10-CM | POA: Diagnosis not present

## 2017-09-20 DIAGNOSIS — Z1211 Encounter for screening for malignant neoplasm of colon: Secondary | ICD-10-CM | POA: Insufficient documentation

## 2017-09-20 HISTORY — PX: COLONOSCOPY: SHX5424

## 2017-09-20 HISTORY — PX: POLYPECTOMY: SHX5525

## 2017-09-20 SURGERY — COLONOSCOPY
Anesthesia: Moderate Sedation

## 2017-09-20 MED ORDER — MEPERIDINE HCL 100 MG/ML IJ SOLN
INTRAMUSCULAR | Status: DC | PRN
  Administered 2017-09-20: 25 mg via INTRAVENOUS
  Administered 2017-09-20 (×2): 50 mg via INTRAVENOUS

## 2017-09-20 MED ORDER — SODIUM CHLORIDE 0.9 % IV SOLN
INTRAVENOUS | Status: DC
  Administered 2017-09-20: 13:00:00 via INTRAVENOUS

## 2017-09-20 MED ORDER — STERILE WATER FOR IRRIGATION IR SOLN
Status: DC | PRN
  Administered 2017-09-20: 15 mL

## 2017-09-20 MED ORDER — ONDANSETRON HCL 4 MG/2ML IJ SOLN
INTRAMUSCULAR | Status: DC | PRN
  Administered 2017-09-20: 4 mg via INTRAVENOUS

## 2017-09-20 MED ORDER — MEPERIDINE HCL 100 MG/ML IJ SOLN
INTRAMUSCULAR | Status: AC
  Filled 2017-09-20: qty 2

## 2017-09-20 MED ORDER — MIDAZOLAM HCL 5 MG/5ML IJ SOLN
INTRAMUSCULAR | Status: AC
  Filled 2017-09-20: qty 10

## 2017-09-20 MED ORDER — ONDANSETRON HCL 4 MG/2ML IJ SOLN
INTRAMUSCULAR | Status: AC
  Filled 2017-09-20: qty 2

## 2017-09-20 MED ORDER — MIDAZOLAM HCL 5 MG/5ML IJ SOLN
INTRAMUSCULAR | Status: DC | PRN
  Administered 2017-09-20: 2 mg via INTRAVENOUS
  Administered 2017-09-20: 1 mg via INTRAVENOUS
  Administered 2017-09-20 (×2): 2 mg via INTRAVENOUS

## 2017-09-20 NOTE — H&P (Signed)
@LOGO @   Primary Care Physician:  Sharilyn Sites, MD Primary Gastroenterologist:  Dr. Gala Romney  Pre-Procedure History & Physical: HPI:  Sean Morales is a 57 y.o. male is here for a screening colonoscopy.  No bowel symptoms. Last colonoscopy 2001 without significant findings. Xarelto has been held 2 days.  Past Medical History:  Diagnosis Date  . Bilateral knee pain   . Depression    no current meds  . DJD (degenerative joint disease)   . First degree heart block   . GERD (gastroesophageal reflux disease)   . Hypertension   . OA (osteoarthritis) of knee    bilateral  . Obstructive sleep apnea   . Persistent atrial fibrillation ([place]) first dx 10/ 2015-- previous primary cardiologist, [name]   followed by atrial fib. clinic-- [name] NP    Past Surgical History:  Procedure Laterality Date  . APPENDECTOMY  age 70  . CARDIOVASCULAR STRESS TEST  01-17-2015  [name]   Low risk nuclear study w/ no ischemia/  normal LV function and wal motion,  nuclear stress ef 60%  . CARDIOVERSION N/A 01/20/2015   Procedure: CARDIOVERSION;  Surgeon: [name], MD;  Location: [facility];  Service: Cardiovascular;  Laterality: N/A;  . colonscopy  age 17  . cysts removed from nipples  in high school   and neck  . ORIF RIGHT KNEE  age 63  . TONSILLECTOMY  age 53  . TOTAL KNEE ARTHROPLASTY Bilateral 05/20/2017   Procedure: TOTAL KNEE BILATERAL;  Surgeon: [name], MD;  Location: [facility];  Service: Orthopedics;  Laterality: Bilateral;  Bilateral Adductor Block  . TRANSTHORACIC ECHOCARDIOGRAM  03/29/2017   ef 60-65%/  trivial AR, MR, and TR/  mild LAE/ moderate RAE/  PASP 28mmHg    Prior to Admission medications   Medication Sig Start Date End Date Taking? Authorizing Provider  acetaminophen (TYLENOL) 500 MG tablet Take 1,000 mg by mouth every 6 (six) hours as needed for moderate pain.   Yes [provider]  amLODipine (NORVASC) 10 MG tablet Take 1 tablet (10 mg  total) by mouth daily. Patient taking differently: Take 10 mg by mouth every evening.  01/28/14  Yes [name], MD  dofetilide (TIKOSYN) 500 MCG capsule TAKE ONE CAPSULE BY MOUTH TWICE A DAY 06/24/17  Yes Sherran Needs, NP  losartan (COZAAR) 100 MG tablet Take 100 mg by mouth daily.  04/05/14  Yes [provider]  Magnesium 250 MG TABS Take 250 mg by mouth daily.    Yes [provider]  metoprolol tartrate (LOPRESSOR) 25 MG tablet TAKE 1 TABLET (25 MG TOTAL) BY MOUTH 2 (TWO) TIMES DAILY. 07/05/17  Yes Sherran Needs, NP  omeprazole (PRILOSEC) 40 MG capsule Take 40 mg by mouth daily.  12/26/13  Yes [provider]  rivaroxaban (XARELTO) 20 MG TABS tablet TAKE 1 TABLET BY MOUTH EVERY DAY WITH DINNER Patient taking differently: Take 20 mg by mouth every evening.  04/26/17  Yes [name], MD  betamethasone dipropionate (DIPROLENE) 0.05 % cream APPLY TO AFFECTED AREA OF LEGS TWICE A DAY AS NEEDED FOR RASH (NOT TO FACE,GROIN, OR UNDERARMS) 07/19/17   [provider]  ibuprofen (ADVIL,MOTRIN) 200 MG tablet Take 400-800 mg by mouth every 6 (six) hours as needed for headache or moderate pain.    [provider]  methocarbamol (ROBAXIN) 500 MG tablet Take 1 tablet (500 mg total) by mouth every 6 (six) hours as needed for muscle spasms. 05/20/17  Danae Orleans, PA-C    Allergies as of 07/22/2017 - Review Complete 07/18/2017  Allergen Reaction Noted  . Bee venom Anaphylaxis 01/17/2015  . Other  01/17/2015  . Penicillins Other (See Comments) 01/09/2014  . Sulfa antibiotics Other (See Comments) 01/09/2014  . Tetracyclines & related Other (See Comments) 01/09/2014    Family History  Problem Relation Age of Onset  . Deep vein thrombosis Father   . Stroke Mother   . Colon cancer Neg Hx     Social History   Socioeconomic History  . Marital status: Married    Spouse name: [place]  . Number of children: 4  . Years of education: 28  .  Highest education level: Not on file  Occupational History    Comment: [employer]  Social Needs  . Financial resource strain: Not on file  . Food insecurity:    Worry: Not on file    Inability: Not on file  . Transportation needs:    Medical: Not on file    Non-medical: Not on file  Tobacco Use  . Smoking status: Current Every Day Smoker    Packs/day: 0.50    Years: 40.00    Pack years: 20.00    Types: Cigarettes    Start date: 01/08/1977  . Smokeless tobacco: Never Used  Substance and Sexual Activity  . Alcohol use: Yes    Alcohol/week: 0.0 oz    Comment: rarely  . Drug use: No  . Sexual activity: Yes  Lifestyle  . Physical activity:    Days per week: Not on file    Minutes per session: Not on file  . Stress: Not on file  Relationships  . Social connections:    Talks on phone: Not on file    Gets together: Not on file    Attends religious service: Not on file    Active member of club or organization: Not on file    Attends meetings of clubs or organizations: Not on file    Relationship status: Not on file  . Intimate partner violence:    Fear of current or ex partner: Not on file    Emotionally abused: Not on file    Physically abused: Not on file    Forced sexual activity: Not on file  Other Topics Concern  . Not on file  Social History Narrative   Lives in [place] (near [place]) with wife.   Patient 1 cup of coffee daily,occas. 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    Review of Systems: See HPI, otherwise negative ROS  Physical Exam: BP 134/87   Pulse 68   Temp 99.1 F (37.3 C) (Oral)   Resp 17   Ht 6' (1.829 m)   Wt 260 lb (117.9 kg)   SpO2 96%   BMI 35.26 kg/m  General:   Alert,  Well-developed, well-nourished, pleasant and cooperative in NAD Lungs:  Clear throughout to auscultation.   No wheezes, crackles, or rhonchi. No acute distress. Heart:  Regular rate and rhythm; no murmurs, clicks, rubs,  or  gallops. Abdomen:  Soft, nontender and nondistended. No masses, hepatosplenomegaly or hernias noted. Normal bowel sounds, without guarding, and without rebound.    Impression/Plan: Sean Morales is now here to undergo a screening colonoscopy.  Average risk screening examination.  Risks, benefits, limitations, imponderables and alternatives regarding colonoscopy have been reviewed with the patient. Questions have been answered. All parties agreeable.     Notice:  This dictation was  prepared with Dragon dictation along with smaller phrase technology. Any transcriptional errors that result from this process are unintentional and may not be corrected upon review.

## 2017-09-20 NOTE — Op Note (Addendum)
[facility] Patient Name: Sean Morales Procedure Date: 09/20/2017 12:42 PM MRN: [id number] Date of Birth: [birth year] Attending MD: Norvel Richards , MD CSN: [id number] Age: 57 Admit Type: Outpatient Procedure:                Colonoscopy Indications:              Screening for colorectal malignant neoplasm Providers:                Norvel Richards, MD, Charlsie Quest. Theda Sers RN, RN,                            Nelma Rothman, Technician Referring MD:              Medicines:                Midazolam 7 mg IV, Meperidine 130 mg IV Complications:            No immediate complications. Estimated Blood Loss:     Estimated blood loss was minimal. Procedure:                Pre-Anesthesia Assessment:                           - Prior to the procedure, a History and Physical                            was performed, and patient medications and                            allergies were reviewed. The patient's tolerance of                            previous anesthesia was also reviewed. The risks                            and benefits of the procedure and the sedation                            options and risks were discussed with the patient.                            All questions were answered, and informed consent                            was obtained. Prior Anticoagulants: The patient                            last took Xarelto (rivaroxaban) 2 days prior to the                            procedure. ASA Grade Assessment: II - A patient                            with mild systemic disease. After reviewing the  risks and benefits, the patient was deemed in                            satisfactory condition to undergo the procedure.                           After obtaining informed consent, the colonoscope                            was passed under direct vision. Throughout the                            procedure, the patient's blood pressure, pulse, and                             oxygen saturations were monitored continuously. The                            EC-[id number] ([id number]) scope was introduced through                            the anus and advanced to the the cecum, identified                            by appendiceal orifice and ileocecal valve. The                            colonoscopy was performed without difficulty. The                            patient tolerated the procedure well. The quality                            of the bowel preparation was adequate. The                            ileocecal valve, appendiceal orifice, and rectum                            were photographed. The entire colon was well                            visualized. Scope In: 1:05:40 PM Scope Out: 1:23:01 PM Scope Withdrawal Time: 0 hours 11 minutes 48 seconds  Total Procedure Duration: 0 hours 17 minutes 21 seconds  Findings:      The perianal and digital rectal examinations were normal.      A 5 mm polyp was found in the splenic flexure. The polyp was       semi-pedunculated. The polyp was removed with a cold snare. Resection       and retrieval were complete. Estimated blood loss was minimal. The       distal 5 cm of terminal ileum mucosa appeared normal.      Scattered medium-mouthed diverticula were found in the sigmoid colon and  descending colon.      The exam was otherwise without abnormality on direct and retroflexion       views. Impression:               - One 5 mm polyp at the splenic flexure, removed                            with a cold snare. Resected and retrieved.                           - Diverticulosis in the sigmoid colon and in the                            descending colon.                           - The examination was otherwise normal on direct                            and retroflexion views. Moderate Sedation:      Moderate (conscious) sedation was administered by the endoscopy nurse       and supervised  by the endoscopist. The following parameters were       monitored: oxygen saturation, heart rate, blood pressure, respiratory       rate, EKG, adequacy of pulmonary ventilation, and response to care.       Total physician intraservice time was 23 minutes. Recommendation:           - Patient has a contact number available for                            emergencies. The signs and symptoms of potential                            delayed complications were discussed with the                            patient. Return to normal activities tomorrow.                            Written discharge instructions were provided to the                            patient.                           - Resume previous diet.                           - Continue present medications. Resume Xarelto                            today.                           - Await pathology results.                           -  Repeat colonoscopy date to be determined after                            pending pathology results are reviewed for                            surveillance based on pathology results.                           - Return to GI office (date not yet determined). Procedure Code(s):        --- Professional ---                           [id number], Colonoscopy, flexible; with removal of                            tumor(s), polyp(s), or other lesion(s) by snare                            technique                           G0500, Moderate sedation services provided by the                            same physician or other qualified health care                            professional performing a gastrointestinal                            endoscopic service that sedation supports,                            requiring the presence of an independent trained                            observer to assist in the monitoring of the                            patient's level of consciousness and physiological                             status; initial 15 minutes of intra-service time;                            patient age 45 years or older (additional time may                            be reported with [phone], as appropriate)                           [phone], Moderate sedation services provided by the  same physician or other qualified health care                            professional performing the diagnostic or                            therapeutic service that the sedation supports,                            requiring the presence of an independent trained                            observer to assist in the monitoring of the                            patient's level of consciousness and physiological                            status; each additional 15 minutes intraservice                            time (List separately in addition to code for                            primary service) Diagnosis Code(s):        --- Professional ---                           Z12.11, Encounter for screening for malignant                            neoplasm of colon                           D12.3, Benign neoplasm of transverse colon (hepatic                            flexure or splenic flexure)                           K57.30, Diverticulosis of large intestine without                            perforation or abscess without bleeding CPT copyright 2017 American Medical Association. All rights reserved. The codes documented in this report are preliminary and upon coder review may  be revised to meet current compliance requirements. Cristopher Estimable. Ledford Goodson, MD Norvel Richards, MD 09/20/2017 1:31:53 PM This report has been signed electronically. Number of Addenda: 0

## 2017-09-20 NOTE — Discharge Instructions (Signed)
Colonoscopy Discharge Instructions  Read the instructions outlined below and refer to this sheet in the next few weeks. These discharge instructions provide you with general information on caring for yourself after you leave the hospital. Your doctor may also give you specific instructions. While your treatment has been planned according to the most current medical practices available, unavoidable complications occasionally occur. If you have any problems or questions after discharge, call Dr. Gala Morales at [phone]. ACTIVITY  You may resume your regular activity, but move at a slower pace for the next 24 hours.   Take frequent rest periods for the next 24 hours.   Walking will help get rid of the air and reduce the bloated feeling in your belly (abdomen).   No driving for 24 hours (because of the medicine (anesthesia) used during the test).    Do not sign any important legal documents or operate any machinery for 24 hours (because of the anesthesia used during the test).  NUTRITION  Drink plenty of fluids.   You may resume your normal diet as instructed by your doctor.   Begin with a light meal and progress to your normal diet. Heavy or fried foods are harder to digest and may make you feel sick to your stomach (nauseated).   Avoid alcoholic beverages for 24 hours or as instructed.  MEDICATIONS  You may resume your normal medications unless your doctor tells you otherwise.  WHAT YOU CAN EXPECT TODAY  Some feelings of bloating in the abdomen.   Passage of more gas than usual.   Spotting of blood in your stool or on the toilet paper.  IF YOU HAD POLYPS REMOVED DURING THE COLONOSCOPY:  No aspirin products for 7 days or as instructed.   No alcohol for 7 days or as instructed.   Eat a soft diet for the next 24 hours.  FINDING OUT THE RESULTS OF YOUR TEST Not all test results are available during your visit. If your test results are not back during the visit, make an appointment  with your caregiver to find out the results. Do not assume everything is normal if you have not heard from your caregiver or the medical facility. It is important for you to follow up on all of your test results.  SEEK IMMEDIATE MEDICAL ATTENTION IF:  You have more than a spotting of blood in your stool.   Your belly is swollen (abdominal distention).   You are nauseated or vomiting.   You have a temperature over 101.   You have abdominal pain or discomfort that is severe or gets worse throughout the day.    Colon polyp and diverticulosis information provided  Further recommendations to follow pending review of pathology report  Resume Xarelto tonight and usual medications      Colon Polyps Polyps are tissue growths inside the body. Polyps can grow in many places, including the large intestine (colon). A polyp may be a round bump or a mushroom-shaped growth. You could have one polyp or several. Most colon polyps are noncancerous (benign). However, some colon polyps can become cancerous over time. What are the causes? The exact cause of colon polyps is not known. What increases the risk? This condition is more likely to develop in people who:  Have a family history of colon cancer or colon polyps.  Are older than 53 or older than 45 if they are African American.  Have inflammatory bowel disease, such as ulcerative colitis or Crohn disease.  Are overweight.  Smoke  cigarettes.  Do not get enough exercise.  Drink too much alcohol.  Eat a diet that is: ? High in fat and red meat. ? Low in fiber.  Had childhood cancer that was treated with abdominal radiation.  What are the signs or symptoms? Most polyps do not cause symptoms. If you have symptoms, they may include:  Blood coming from your rectum when having a bowel movement.  Blood in your stool.The stool may look dark red or black.  A change in bowel habits, such as constipation or diarrhea.  How is this  diagnosed? This condition is diagnosed with a colonoscopy. This is a procedure that uses a lighted, flexible scope to look at the inside of your colon. How is this treated? Treatment for this condition involves removing any polyps that are found. Those polyps will then be tested for cancer. If cancer is found, your health care provider will talk to you about options for colon cancer treatment. Follow these instructions at home: Diet  Eat plenty of fiber, such as fruits, vegetables, and whole grains.  Eat foods that are high in calcium and vitamin D, such as milk, cheese, yogurt, eggs, liver, fish, and broccoli.  Limit foods high in fat, red meats, and processed meats, such as hot dogs, sausage, bacon, and lunch meats.  Maintain a healthy weight, or lose weight if recommended by your health care provider. General instructions  Do not smoke cigarettes.  Do not drink alcohol excessively.  Keep all follow-up visits as told by your health care provider. This is important. This includes keeping regularly scheduled colonoscopies. Talk to your health care provider about when you need a colonoscopy.  Exercise every day or as told by your health care provider. Contact a health care provider if:  You have new or worsening bleeding during a bowel movement.  You have new or increased blood in your stool.  You have a change in bowel habits.  You unexpectedly lose weight. This information is not intended to replace advice given to you by your health care provider. Make sure you discuss any questions you have with your health care provider. Document Released: 12/14/2003 Document Revised: 08/25/2015 Document Reviewed: 02/07/2015 Elsevier Interactive Patient Education  [employer].    Diverticulosis Diverticulosis is a condition that develops when small pouches (diverticula) form in the wall of the large intestine (colon). The colon is where water is absorbed and stool is formed. The  pouches form when the inside layer of the colon pushes through weak spots in the outer layers of the colon. You may have a few pouches or many of them. What are the causes? The cause of this condition is not known. What increases the risk? The following factors may make you more likely to develop this condition:  Being older than age 59. Your risk for this condition increases with age. Diverticulosis is rare among people younger than age 46. By age 43, many people have it.  Eating a low-fiber diet.  Having frequent constipation.  Being overweight.  Not getting enough exercise.  Smoking.  Taking over-the-counter pain medicines, like aspirin and ibuprofen.  Having a family history of diverticulosis.  What are the signs or symptoms? In most people, there are no symptoms of this condition. If you do have symptoms, they may include:  Bloating.  Cramps in the abdomen.  Constipation or diarrhea.  Pain in the lower left side of the abdomen.  How is this diagnosed? This condition is most often diagnosed during  an exam for other colon problems. Because diverticulosis usually has no symptoms, it often cannot be diagnosed independently. This condition may be diagnosed by:  Using a flexible scope to examine the colon (colonoscopy).  Taking an X-ray of the colon after dye has been put into the colon (barium enema).  Doing a CT scan.  How is this treated? You may not need treatment for this condition if you have never developed an infection related to diverticulosis. If you have had an infection before, treatment may include:  Eating a high-fiber diet. This may include eating more fruits, vegetables, and grains.  Taking a fiber supplement.  Taking a live bacteria supplement (probiotic).  Taking medicine to relax your colon.  Taking antibiotic medicines.  Follow these instructions at home:  Drink 6-8 glasses of water or more each day to prevent constipation.  Try not to  strain when you have a bowel movement.  If you have had an infection before: ? Eat more fiber as directed by your health care provider or your diet and nutrition specialist (dietitian). ? Take a fiber supplement or probiotic, if your health care provider approves.  Take over-the-counter and prescription medicines only as told by your health care provider.  If you were prescribed an antibiotic, take it as told by your health care provider. Do not stop taking the antibiotic even if you start to feel better.  Keep all follow-up visits as told by your health care provider. This is important. Contact a health care provider if:  You have pain in your abdomen.  You have bloating.  You have cramps.  You have not had a bowel movement in 3 days. Get help right away if:  Your pain gets worse.  Your bloating becomes very bad.  You have a fever or chills, and your symptoms suddenly get worse.  You vomit.  You have bowel movements that are bloody or black.  You have bleeding from your rectum. Summary  Diverticulosis is a condition that develops when small pouches (diverticula) form in the wall of the large intestine (colon).  You may have a few pouches or many of them.  This condition is most often diagnosed during an exam for other colon problems.  If you have had an infection related to diverticulosis, treatment may include increasing the fiber in your diet, taking supplements, or taking medicines. This information is not intended to replace advice given to you by your health care provider. Make sure you discuss any questions you have with your health care provider. Document Released: 12/15/2003 Document Revised: 02/06/2016 Document Reviewed: 02/06/2016 Elsevier Interactive Patient Education  2017 [employer].

## 2017-09-24 DIAGNOSIS — M67962 Unspecified disorder of synovium and tendon, left lower leg: Secondary | ICD-10-CM | POA: Diagnosis not present

## 2017-09-24 DIAGNOSIS — M67961 Unspecified disorder of synovium and tendon, right lower leg: Secondary | ICD-10-CM | POA: Diagnosis not present

## 2017-09-25 ENCOUNTER — Encounter (HOSPITAL_COMMUNITY): Payer: Self-pay | Admitting: Internal Medicine

## 2017-09-25 ENCOUNTER — Encounter: Payer: Self-pay | Admitting: Internal Medicine

## 2017-10-02 ENCOUNTER — Other Ambulatory Visit: Payer: Self-pay | Admitting: Nurse Practitioner

## 2017-10-14 DIAGNOSIS — M67969 Unspecified disorder of synovium and tendon, unspecified lower leg: Secondary | ICD-10-CM | POA: Diagnosis not present

## 2017-10-16 ENCOUNTER — Other Ambulatory Visit: Payer: Self-pay | Admitting: Nurse Practitioner

## 2017-10-21 DIAGNOSIS — M67961 Unspecified disorder of synovium and tendon, right lower leg: Secondary | ICD-10-CM | POA: Diagnosis not present

## 2017-10-24 DIAGNOSIS — M67961 Unspecified disorder of synovium and tendon, right lower leg: Secondary | ICD-10-CM | POA: Diagnosis not present

## 2017-10-24 DIAGNOSIS — M67962 Unspecified disorder of synovium and tendon, left lower leg: Secondary | ICD-10-CM | POA: Diagnosis not present

## 2017-10-29 DIAGNOSIS — M67961 Unspecified disorder of synovium and tendon, right lower leg: Secondary | ICD-10-CM | POA: Diagnosis not present

## 2017-10-31 DIAGNOSIS — M67961 Unspecified disorder of synovium and tendon, right lower leg: Secondary | ICD-10-CM | POA: Diagnosis not present

## 2017-11-04 DIAGNOSIS — M67969 Unspecified disorder of synovium and tendon, unspecified lower leg: Secondary | ICD-10-CM | POA: Diagnosis not present

## 2017-11-07 DIAGNOSIS — M67961 Unspecified disorder of synovium and tendon, right lower leg: Secondary | ICD-10-CM | POA: Diagnosis not present

## 2017-11-11 DIAGNOSIS — M67961 Unspecified disorder of synovium and tendon, right lower leg: Secondary | ICD-10-CM | POA: Diagnosis not present

## 2017-11-20 ENCOUNTER — Encounter (HOSPITAL_COMMUNITY): Payer: Self-pay | Admitting: Nurse Practitioner

## 2017-11-20 ENCOUNTER — Ambulatory Visit (HOSPITAL_COMMUNITY)
Admission: RE | Admit: 2017-11-20 | Discharge: 2017-11-20 | Disposition: A | Discharge status: Home / Self Care | Payer: BLUE CROSS/BLUE SHIELD | Source: Ambulatory Visit | Attending: Nurse Practitioner | Admitting: Nurse Practitioner

## 2017-11-20 VITALS — BP 134/82 | HR 64 | Ht 72.0 in | Wt 294.8 lb

## 2017-11-20 DIAGNOSIS — Z7901 Long term (current) use of anticoagulants: Secondary | ICD-10-CM | POA: Diagnosis not present

## 2017-11-20 DIAGNOSIS — Z8249 Family history of ischemic heart disease and other diseases of the circulatory system: Secondary | ICD-10-CM | POA: Insufficient documentation

## 2017-11-20 DIAGNOSIS — Z880 Allergy status to penicillin: Secondary | ICD-10-CM | POA: Diagnosis not present

## 2017-11-20 DIAGNOSIS — G4733 Obstructive sleep apnea (adult) (pediatric): Secondary | ICD-10-CM | POA: Diagnosis not present

## 2017-11-20 DIAGNOSIS — F329 Major depressive disorder, single episode, unspecified: Secondary | ICD-10-CM | POA: Insufficient documentation

## 2017-11-20 DIAGNOSIS — F17210 Nicotine dependence, cigarettes, uncomplicated: Secondary | ICD-10-CM | POA: Insufficient documentation

## 2017-11-20 DIAGNOSIS — Z9049 Acquired absence of other specified parts of digestive tract: Secondary | ICD-10-CM | POA: Insufficient documentation

## 2017-11-20 DIAGNOSIS — Z6839 Body mass index (BMI) 39.0-39.9, adult: Secondary | ICD-10-CM | POA: Insufficient documentation

## 2017-11-20 DIAGNOSIS — E669 Obesity, unspecified: Secondary | ICD-10-CM | POA: Insufficient documentation

## 2017-11-20 DIAGNOSIS — M25571 Pain in right ankle and joints of right foot: Secondary | ICD-10-CM | POA: Diagnosis not present

## 2017-11-20 DIAGNOSIS — Z881 Allergy status to other antibiotic agents status: Secondary | ICD-10-CM | POA: Diagnosis not present

## 2017-11-20 DIAGNOSIS — Z79899 Other long term (current) drug therapy: Secondary | ICD-10-CM | POA: Diagnosis not present

## 2017-11-20 DIAGNOSIS — I10 Essential (primary) hypertension: Secondary | ICD-10-CM | POA: Diagnosis not present

## 2017-11-20 DIAGNOSIS — K219 Gastro-esophageal reflux disease without esophagitis: Secondary | ICD-10-CM | POA: Insufficient documentation

## 2017-11-20 DIAGNOSIS — Z882 Allergy status to sulfonamides status: Secondary | ICD-10-CM | POA: Diagnosis not present

## 2017-11-20 DIAGNOSIS — Z823 Family history of stroke: Secondary | ICD-10-CM | POA: Diagnosis not present

## 2017-11-20 DIAGNOSIS — I440 Atrioventricular block, first degree: Secondary | ICD-10-CM | POA: Insufficient documentation

## 2017-11-20 DIAGNOSIS — I4891 Unspecified atrial fibrillation: Secondary | ICD-10-CM | POA: Diagnosis not present

## 2017-11-20 DIAGNOSIS — I481 Persistent atrial fibrillation: Secondary | ICD-10-CM | POA: Diagnosis not present

## 2017-11-20 DIAGNOSIS — Z96653 Presence of artificial knee joint, bilateral: Secondary | ICD-10-CM | POA: Diagnosis not present

## 2017-11-20 DIAGNOSIS — Z91030 Bee allergy status: Secondary | ICD-10-CM | POA: Diagnosis not present

## 2017-11-20 LAB — BASIC METABOLIC PANEL
ANION GAP: 7 (ref 5–15)
BUN: 10 mg/dL (ref 6–20)
CALCIUM: 8.7 mg/dL — AB (ref 8.9–10.3)
CO2: 27 mmol/L (ref 22–32)
Chloride: 104 mmol/L (ref 98–111)
Creatinine, Ser: 1.04 mg/dL (ref 0.61–1.24)
GFR calc Af Amer: >60 mL/min (ref >60)
GFR calc non Af Amer: >60 mL/min (ref >60)
GLUCOSE: 91 mg/dL (ref 70–99)
POTASSIUM: 4.3 mmol/L (ref 3.5–5.1)
Sodium: 138 mmol/L (ref 135–145)

## 2017-11-20 LAB — MAGNESIUM: Magnesium: 2.2 mg/dL (ref 1.7–2.4)

## 2017-11-20 NOTE — Progress Notes (Signed)
Patient ID: Sean Morales, male   DOB: 1960/04/23, 57 y.o.   MRN: [id number]     Primary Care Physician: Sharilyn Sites, MD Referring Physician: [facility] f/u EP: Dr. Merlene Morse is a 57 y.o. male with a h/o persistent afib on tikosyn therapy. He has not had any significant afib.He feels improved. No bleeding issues with xarelto.  F/u in afib clinic, 8/24, he feels well. He is starting to be more active because his knees are not bothering him as much and has lost 30 lbs and hopes to lose more. He and his wife have joined the [employer] and he hopes to be more active. Continues on tikosyn and xarelto without any afib or bleeding issues.  F/u in afib clinic 03/19/17. He has not noted any afib.Continues on tikosyn. He has lost around 50- 60 lbs by his scales, weight  this am was 250 lbs before dressing. He is continuing to have knee issues and is expecting to have knee surgery in the next couple of months. He may be interested in an ablation after knee surgery is complete with maintaining SR and reduction  in weight. Will update echo. Continues on xarelto 20 mg daily with a chadsvasc score of 1. Is  using cpap.  F/u in the afib clinic, 4/18. Since I saw him last, he has had both knees replaced. He did well with the surgery and will be slowly getting back to his work/ exercise routine. He is looking at having a colonoscopy in the next few weeks. No afib during the surgery or recovery process. He continues on Tikosyn and  Xarelto.  F/u in afib clinc 8/21. He is still having some issues with back pain/ knee pain since bilateral knee replacements. Mild swelling of both ankles since then. He has also gained 25 lbs+ since his surgery since he not been as active. He has though been staying in rhythm with dofetilide.   Today, he denies symptoms  of shortness of breath, orthopnea, PND, lower extremity edema, dizziness, presyncope, syncope, or neurologic sequela.Positive of symptoms as  listed above.The  patient is tolerating medications without difficulties and is otherwise without complaint today.   Past Medical History:  Diagnosis Date  . Bilateral knee pain   . Depression    no current meds  . DJD (degenerative joint disease)   . First degree heart block   . GERD (gastroesophageal reflux disease)   . Hypertension   . OA (osteoarthritis) of knee    bilateral  . Obstructive sleep apnea   . Persistent atrial fibrillation ([place]) first dx 10/ 2015-- previous primary cardiologist, [name]   followed by atrial fib. clinic-- [name] NP   Past Surgical History:  Procedure Laterality Date  . APPENDECTOMY  age 40  . CARDIOVASCULAR STRESS TEST  01-17-2015  [name]   Low risk nuclear study w/ no ischemia/  normal LV function and wal motion,  nuclear stress ef 60%  . CARDIOVERSION N/A 01/20/2015   Procedure: CARDIOVERSION;  Surgeon: [name], MD;  Location: [facility];  Service: Cardiovascular;  Laterality: N/A;  . COLONOSCOPY N/A 09/20/2017   Procedure: COLONOSCOPY;  Surgeon: [name], MD;  Location: [facility];  Service: Endoscopy;  Laterality: N/A;  1:30pm  . colonscopy  age 56  . cysts removed from nipples  in high school   and neck  . ORIF RIGHT KNEE  age 50  . POLYPECTOMY  09/20/2017   Procedure: POLYPECTOMY;  Surgeon:  Rourk, Cristopher Estimable, MD;  Location: [facility];  Service: Endoscopy;;  . TONSILLECTOMY  age 20  . TOTAL KNEE ARTHROPLASTY Bilateral 05/20/2017   Procedure: TOTAL KNEE BILATERAL;  Surgeon: [name], MD;  Location: [facility];  Service: Orthopedics;  Laterality: Bilateral;  Bilateral Adductor Block  . TRANSTHORACIC ECHOCARDIOGRAM  03/29/2017   ef 60-65%/  trivial AR, MR, and TR/  mild LAE/ moderate RAE/  PASP 58mmHg    Current Outpatient Medications  Medication Sig Dispense Refill  . acetaminophen (TYLENOL) 500 MG tablet Take 1,000 mg by mouth every 6 (six) hours as needed for moderate pain.    Marland Kitchen amLODipine (NORVASC) 10 MG tablet Take 1  tablet (10 mg total) by mouth daily. (Patient taking differently: Take 10 mg by mouth every evening. )    . betamethasone dipropionate (DIPROLENE) 0.05 % cream APPLY TO AFFECTED AREA OF LEGS TWICE A DAY AS NEEDED FOR RASH (NOT TO FACE,GROIN, OR UNDERARMS)  3  . dofetilide (TIKOSYN) 500 MCG capsule TAKE ONE CAPSULE BY MOUTH TWICE A DAY 180 capsule 1  . ibuprofen (ADVIL,MOTRIN) 200 MG tablet Take 400-800 mg by mouth every 6 (six) hours as needed for headache or moderate pain.    Marland Kitchen losartan (COZAAR) 100 MG tablet Take 100 mg by mouth daily.     . Magnesium 250 MG TABS Take 250 mg by mouth daily.     . metoprolol tartrate (LOPRESSOR) 25 MG tablet TAKE 1 TABLET BY MOUTH TWICE A DAY 180 tablet 2  . omeprazole (PRILOSEC) 40 MG capsule Take 40 mg by mouth daily.     . rivaroxaban (XARELTO) 20 MG TABS tablet TAKE 1 TABLET BY MOUTH EVERY DAY WITH DINNER (Patient taking differently: Take 20 mg by mouth every evening. ) 90 tablet 3  . methocarbamol (ROBAXIN) 500 MG tablet Take 1 tablet (500 mg total) by mouth every 6 (six) hours as needed for muscle spasms. (Patient not taking: Reported on 11/20/2017) 40 tablet 0   No current facility-administered medications for this encounter.     Allergies  Allergen Reactions  . Bee Venom Anaphylaxis    [place] Hornet  . Other     Co pyronil per patient not sure  . Penicillins Other (See Comments)    Unknown. Childhood allergy. Has patient had a PCN reaction causing immediate rash, facial/tongue/throat swelling, SOB or lightheadedness with hypotension: Unknown Has patient had a PCN reaction causing severe rash involving mucus membranes or skin necrosis: Unknown Has patient had a PCN reaction that required hospitalization: Unknown Has patient had a PCN reaction occurring within the last 10 years: No If all of the above answers are "NO", then may proceed with Cephalosporin use.   . Sulfa Antibiotics Other (See Comments)    Unknown. Childhood allergy.  .  Tetracyclines & Related Other (See Comments)    Unknown. Childhood allergy.    Social History   Socioeconomic History  . Marital status: Married    Spouse name: Aurora  . Number of children: 4  . Years of education: 49  . Highest education level: Not on file  Occupational History    Comment: [employer]  Social Needs  . Financial resource strain: Not on file  . Food insecurity:    Worry: Not on file    Inability: Not on file  . Transportation needs:    Medical: Not on file    Non-medical: Not on file  Tobacco Use  . Smoking status: Current Every Day Smoker  Packs/day: 0.50    Years: 40.00    Pack years: 20.00    Types: Cigarettes    Start date: 01/08/1977  . Smokeless tobacco: Never Used  Substance and Sexual Activity  . Alcohol use: Yes    Alcohol/week: 0.0 standard drinks    Comment: rarely  . Drug use: No  . Sexual activity: Yes  Lifestyle  . Physical activity:    Days per week: Not on file    Minutes per session: Not on file  . Stress: Not on file  Relationships  . Social connections:    Talks on phone: Not on file    Gets together: Not on file    Attends religious service: Not on file    Active member of club or organization: Not on file    Attends meetings of clubs or organizations: Not on file    Relationship status: Not on file  . Intimate partner violence:    Fear of current or ex partner: Not on file    Emotionally abused: Not on file    Physically abused: Not on file    Forced sexual activity: Not on file  Other Topics Concern  . Not on file  Social History Narrative   Lives in [place] with wife.   Patient 1 cup of coffee daily,occas. 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    Family History  Problem Relation Age of Onset  . Deep vein thrombosis Father   . Stroke Mother   . Colon cancer Neg Hx     ROS- All systems are reviewed and negative except as per the HPI above  Physical Exam: Vitals:    11/20/17 1129  BP: 134/82  Pulse: 64  Weight: 133.7 kg  Height: 6' (1.829 m)    GEN- The patient is well appearing, alert and oriented x 3 today.   Head- normocephalic, atraumatic Eyes-  Sclera clear, conjunctiva pink Ears- hearing intact Oropharynx- clear Neck- supple, no JVP Lymph- no cervical lymphadenopathy Lungs- Clear to ausculation bilaterally, normal work of breathing Heart- Regular rate and rhythm, no murmurs, rubs or gallops, PMI not laterally displaced GI- soft, NT, ND, + BS Extremities- no clubbing, cyanosis, or edema, surgical scars present both knees MS- no significant deformity or atrophy Skin- no rash or lesion Psych- euthymic mood, full affect Neuro- strength and sensation are intact  EKG- Sinus rhythm with first degree AV block, pr int 288 ms, qrs int 84 ms, qtc 458 ms Event monitor results reviewed  Assessment and Plan: 1. Persistent afib Doing well on tikosyn, staying in SR  Continue tikosyn 500 mcg bid Continue metoprolol Continue xarelto for chadsvasc score of at least 1  Bmet/mag today   2. HTN Stable  3. Bilateral knee replacement Doing well s/p surgery 05/20/17  4. Obesity Has gained weight since procedure and I think this as well as the knee surgery is contributing to mild LLE  He was encouraged to modify diet for weight loss.   F/u in afib clinic in 6 months   Geroge Baseman. Diquan Kassis, [facility] [address] [place], [place] [address] [phone]

## 2017-11-27 DIAGNOSIS — M7661 Achilles tendinitis, right leg: Secondary | ICD-10-CM | POA: Diagnosis not present

## 2018-01-02 ENCOUNTER — Other Ambulatory Visit (HOSPITAL_COMMUNITY): Payer: Self-pay | Admitting: Nurse Practitioner

## 2018-02-25 DIAGNOSIS — Z1389 Encounter for screening for other disorder: Secondary | ICD-10-CM | POA: Diagnosis not present

## 2018-02-25 DIAGNOSIS — Z719 Counseling, unspecified: Secondary | ICD-10-CM | POA: Diagnosis not present

## 2018-02-25 DIAGNOSIS — E782 Mixed hyperlipidemia: Secondary | ICD-10-CM | POA: Diagnosis not present

## 2018-02-25 DIAGNOSIS — Z6839 Body mass index (BMI) 39.0-39.9, adult: Secondary | ICD-10-CM | POA: Diagnosis not present

## 2018-02-25 DIAGNOSIS — Z0001 Encounter for general adult medical examination with abnormal findings: Secondary | ICD-10-CM | POA: Diagnosis not present

## 2018-02-25 DIAGNOSIS — G4733 Obstructive sleep apnea (adult) (pediatric): Secondary | ICD-10-CM | POA: Diagnosis not present

## 2018-02-25 DIAGNOSIS — Z720 Tobacco use: Secondary | ICD-10-CM | POA: Diagnosis not present

## 2018-02-25 DIAGNOSIS — I10 Essential (primary) hypertension: Secondary | ICD-10-CM | POA: Diagnosis not present

## 2018-03-06 DIAGNOSIS — D225 Melanocytic nevi of trunk: Secondary | ICD-10-CM | POA: Diagnosis not present

## 2018-03-06 DIAGNOSIS — B355 Tinea imbricata: Secondary | ICD-10-CM | POA: Diagnosis not present

## 2018-03-06 DIAGNOSIS — I872 Venous insufficiency (chronic) (peripheral): Secondary | ICD-10-CM | POA: Diagnosis not present

## 2018-03-06 DIAGNOSIS — D485 Neoplasm of uncertain behavior of skin: Secondary | ICD-10-CM | POA: Diagnosis not present

## 2018-03-06 DIAGNOSIS — Z1283 Encounter for screening for malignant neoplasm of skin: Secondary | ICD-10-CM | POA: Diagnosis not present

## 2018-03-13 DIAGNOSIS — L918 Other hypertrophic disorders of the skin: Secondary | ICD-10-CM | POA: Diagnosis not present

## 2018-03-17 ENCOUNTER — Other Ambulatory Visit: Payer: Self-pay

## 2018-03-17 ENCOUNTER — Ambulatory Visit (INDEPENDENT_AMBULATORY_CARE_PROVIDER_SITE_OTHER): Payer: BLUE CROSS/BLUE SHIELD | Admitting: Podiatry

## 2018-03-17 ENCOUNTER — Encounter: Payer: Self-pay | Admitting: Podiatry

## 2018-03-17 DIAGNOSIS — B351 Tinea unguium: Secondary | ICD-10-CM

## 2018-03-17 DIAGNOSIS — L603 Nail dystrophy: Secondary | ICD-10-CM

## 2018-03-17 DIAGNOSIS — M79676 Pain in unspecified toe(s): Secondary | ICD-10-CM | POA: Diagnosis not present

## 2018-03-17 MED ORDER — GENTAMICIN SULFATE 0.1 % EX CREA: 1.0000 "application " | TOPICAL_CREAM | Freq: Two times a day (BID) | CUTANEOUS | 1 refills | Status: DC

## 2018-03-17 NOTE — Patient Instructions (Signed)

## 2018-03-18 DIAGNOSIS — G4733 Obstructive sleep apnea (adult) (pediatric): Secondary | ICD-10-CM | POA: Diagnosis not present

## 2018-03-20 NOTE — Progress Notes (Signed)
   Subjective: 57 year old male presenting today with a chief complaint of pain, thickening and discoloration of the 1st and 2nd toenails of the left foot that has been ongoing for the past few months. He also reports pain to both borders of the left third toe. Touching and applying pressure to the nails increases the pain. He has not done anything for treatment. Patient is here for further evaluation and treatment.   Past Medical History:  Diagnosis Date  . Bilateral knee pain   . Depression    no current meds  . DJD (degenerative joint disease)   . First degree heart block   . GERD (gastroesophageal reflux disease)   . Hypertension   . OA (osteoarthritis) of knee    bilateral  . Obstructive sleep apnea   . Persistent atrial fibrillation first dx 10/ 2015-- previous primary cardiologist, [name]   followed by atrial fib. clinic-- [name] NP    Objective: Physical Exam General: The patient is alert and oriented x3 in no acute distress.  Dermatology: Hyperkeratotic, discolored, thickened, onychodystrophy of the 1st and 2nd toenails of the left foot. Skin is cool, dry and supple bilateral lower extremities. Negative for open lesions or macerations.  Vascular: Palpable pedal pulses bilaterally. No edema or erythema noted. Capillary refill within normal limits.  Neurological: Epicritic and protective threshold grossly intact bilaterally.   Musculoskeletal Exam: All pedal and ankle joints range of motion within normal limits bilateral. Muscle strength 5/5 in all groups bilateral.    Assesement: #1 Painful, dystrophic nails digits 1 and 2 left  Plan of Care:  1. Patient evaluated.  2. Discussed treatment alternatives and plan of care. Explained nail avulsion procedure and post procedure course to patient. 3. Patient opted for permanent total nail avulsions of the first and second digits of the left foot.  4. Prior to procedure, local anesthesia infiltration utilized using  3 ml of a 50:50 mixture of 2% plain lidocaine and 0.5% plain marcaine in a normal hallux and digital block fashion and a betadine prep performed.  5. Total permanent nail avulsion with chemical matrixectomy performed using [id number] applications of phenol followed by alcohol flush.  6. Light dressing applied. 7. Prescription for Gentamicin cream provided to patient to use daily with a bandage.  8. Return to clinic in 3 weeks.   Edrick Kins, DPM [facility]  Dr. Edrick Kins, [address]                                        [place], [place] [address]                Office [phone]  Fax [phone]

## 2018-04-07 ENCOUNTER — Ambulatory Visit (INDEPENDENT_AMBULATORY_CARE_PROVIDER_SITE_OTHER): Payer: BLUE CROSS/BLUE SHIELD | Admitting: Podiatry

## 2018-04-07 DIAGNOSIS — B351 Tinea unguium: Secondary | ICD-10-CM | POA: Diagnosis not present

## 2018-04-07 DIAGNOSIS — M79676 Pain in unspecified toe(s): Secondary | ICD-10-CM

## 2018-04-07 DIAGNOSIS — L603 Nail dystrophy: Secondary | ICD-10-CM

## 2018-04-09 NOTE — Progress Notes (Signed)
   Subjective: Patient presents today 2 weeks post total permanent nail avulsion procedures of the 1st and 2nd digits of the left foot. Patient states that the toe and nail fold is feeling much better. He reports some soreness at the injection site that last for several days afterwards but this has since resolved. Patient is here for further evaluation and treatment.   Past Medical History:  Diagnosis Date  . Bilateral knee pain   . Depression    no current meds  . DJD (degenerative joint disease)   . First degree heart block   . GERD (gastroesophageal reflux disease)   . Hypertension   . OA (osteoarthritis) of knee    bilateral  . Obstructive sleep apnea   . Persistent atrial fibrillation first dx 10/ 2015-- previous primary cardiologist, [name]   followed by atrial fib. clinic-- [name] NP    Objective: Skin is warm, dry and supple. Nail bed and respective nail fold appears to be healing appropriately. Open wound to the associated nail fold with a granular wound base and moderate amount of fibrotic tissue. Minimal drainage noted. Mild erythema around the periungual region likely due to phenol chemical matricectomy.  Assessment: #1 postop permanent total nail avulsion digits 1 and 2 left  #2 open wound periungual nail fold and nail bed of respective digit.   Plan of care: #1 patient was evaluated  #2 debridement of open wound was performed to the periungual border and nail fold of the respective toe using a currette. Antibiotic ointment and Band-Aid was applied. #3 patient is to return to clinic on a PRN  basis.   Edrick Kins, DPM [facility]  Dr. Edrick Kins, [address]                                        [place], [place] [address]                Office [phone]  Fax [phone]

## 2018-05-07 ENCOUNTER — Other Ambulatory Visit: Payer: Self-pay | Admitting: Cardiovascular Disease

## 2018-05-22 ENCOUNTER — Encounter (HOSPITAL_COMMUNITY): Payer: Self-pay | Admitting: Nurse Practitioner

## 2018-05-22 ENCOUNTER — Ambulatory Visit (HOSPITAL_COMMUNITY)
Admission: RE | Admit: 2018-05-22 | Discharge: 2018-05-22 | Disposition: A | Discharge status: Home / Self Care | Payer: BLUE CROSS/BLUE SHIELD | Source: Ambulatory Visit | Attending: Nurse Practitioner | Admitting: Nurse Practitioner

## 2018-05-22 VITALS — BP 140/84 | HR 74 | Ht 72.0 in | Wt 302.0 lb

## 2018-05-22 DIAGNOSIS — Z79899 Other long term (current) drug therapy: Secondary | ICD-10-CM | POA: Insufficient documentation

## 2018-05-22 DIAGNOSIS — M25561 Pain in right knee: Secondary | ICD-10-CM | POA: Diagnosis not present

## 2018-05-22 DIAGNOSIS — F17210 Nicotine dependence, cigarettes, uncomplicated: Secondary | ICD-10-CM | POA: Insufficient documentation

## 2018-05-22 DIAGNOSIS — I4819 Other persistent atrial fibrillation: Secondary | ICD-10-CM | POA: Diagnosis not present

## 2018-05-22 DIAGNOSIS — I484 Atypical atrial flutter: Secondary | ICD-10-CM

## 2018-05-22 DIAGNOSIS — I10 Essential (primary) hypertension: Secondary | ICD-10-CM | POA: Insufficient documentation

## 2018-05-22 DIAGNOSIS — I4892 Unspecified atrial flutter: Secondary | ICD-10-CM | POA: Diagnosis not present

## 2018-05-22 DIAGNOSIS — M25562 Pain in left knee: Secondary | ICD-10-CM | POA: Diagnosis not present

## 2018-05-22 DIAGNOSIS — Z7901 Long term (current) use of anticoagulants: Secondary | ICD-10-CM | POA: Insufficient documentation

## 2018-05-22 DIAGNOSIS — M1990 Unspecified osteoarthritis, unspecified site: Secondary | ICD-10-CM | POA: Insufficient documentation

## 2018-05-22 DIAGNOSIS — K219 Gastro-esophageal reflux disease without esophagitis: Secondary | ICD-10-CM | POA: Insufficient documentation

## 2018-05-22 LAB — BASIC METABOLIC PANEL
ANION GAP: 9 (ref 5–15)
BUN: 10 mg/dL (ref 6–20)
CHLORIDE: 103 mmol/L (ref 98–111)
CO2: 28 mmol/L (ref 22–32)
Calcium: 8.8 mg/dL — ABNORMAL LOW (ref 8.9–10.3)
Creatinine, Ser: 1 mg/dL (ref 0.61–1.24)
GFR calc Af Amer: >60 mL/min (ref >60)
GFR calc non Af Amer: >60 mL/min (ref >60)
Glucose, Bld: 93 mg/dL (ref 70–99)
Potassium: 4 mmol/L (ref 3.5–5.1)
Sodium: 140 mmol/L (ref 135–145)

## 2018-05-22 LAB — MAGNESIUM: Magnesium: 2 mg/dL (ref 1.7–2.4)

## 2018-05-22 NOTE — Progress Notes (Signed)
Patient ID: Sean Morales, male   DOB: 1960/12/19, 58 y.o.   MRN: [id number]     Primary Care Physician: Sharilyn Sites, MD Referring Physician: [facility] f/u EP: Dr. Merlene Morse is a 58 y.o. male with a h/o persistent afib on tikosyn therapy. He has not had any significant afib.He feels improved. No bleeding issues with xarelto.  F/u in afib clinic, 8/24, he feels well. He is starting to be more active because his knees are not bothering him as much and has lost 30 lbs and hopes to lose more. He and his wife have joined the [place] and he hopes to be more active. Continues on tikosyn and xarelto without any afib or bleeding issues.  F/u in afib clinic 03/19/17. He has not noted any afib.Continues on tikosyn. He has lost around 50- 60 lbs by his scales, weight  this am was 250 lbs before dressing. He is continuing to have knee issues and is expecting to have knee surgery in the next couple of months. He may be interested in an ablation after knee surgery is complete with maintaining SR and reduction  in weight. Will update echo. Continues on xarelto 20 mg daily with a chadsvasc score of 1. Is  using cpap.  F/u in the afib clinic, 4/18. Since I saw him last, he has had both knees replaced. He did well with the surgery and will be slowly getting back to his work/ exercise routine. He is looking at having a colonoscopy in the next few weeks. No afib during the surgery or recovery process. He continues on Tikosyn and  Xarelto.  F/u in afib clinc 8/21. He is still having some issues with back pain/ knee pain since bilateral knee replacements. Mild swelling of both ankles since then. He has also gained 25 lbs+ since his surgery since he not been as active. He has though been staying in rhythm with dofetilide.   F/u in afib clinic for Tikosyn surveillance. He unfortunately is out of rhythm today. He states that he got nervous trying to get here today with the traffic and started feeling jumpy in  his chest, otherwise has not noted anything different. Nothing is different with his health. He has struggled with his weight and is now 302 lb's.   Today, he denies symptoms  of shortness of breath, orthopnea, PND, lower extremity edema, dizziness, presyncope, syncope, or neurologic sequela.Positive of symptoms as  listed above.The patient is tolerating medications without difficulties and is otherwise without complaint today.   Past Medical History:  Diagnosis Date  . Bilateral knee pain   . Depression    no current meds  . DJD (degenerative joint disease)   . First degree heart block   . GERD (gastroesophageal reflux disease)   . Hypertension   . OA (osteoarthritis) of knee    bilateral  . Obstructive sleep apnea   . Persistent atrial fibrillation first dx 10/ 2015-- previous primary cardiologist, [name]   followed by atrial fib. clinic-- [name] NP   Past Surgical History:  Procedure Laterality Date  . APPENDECTOMY  age 24  . CARDIOVASCULAR STRESS TEST  01-17-2015  [name]   Low risk nuclear study w/ no ischemia/  normal LV function and wal motion,  nuclear stress ef 60%  . CARDIOVERSION N/A 01/20/2015   Procedure: CARDIOVERSION;  Surgeon: [name], MD;  Location: [facility];  Service: Cardiovascular;  Laterality: N/A;  . COLONOSCOPY N/A 09/20/2017   Procedure:  COLONOSCOPY;  Surgeon: [name], MD;  [facility];  Service: Endoscopy;  Laterality: N/A;  1:30pm  . colonscopy  age 22  . cysts removed from nipples  in high school   and neck  . ORIF RIGHT KNEE  age 72  . POLYPECTOMY  09/20/2017   Procedure: POLYPECTOMY;  Surgeon: [name], MD;  [facility]: [facility];  Service: Endoscopy;;  . TONSILLECTOMY  age 49  . TOTAL KNEE ARTHROPLASTY Bilateral 05/20/2017   Procedure: TOTAL KNEE BILATERAL;  Surgeon: [name], MD;  Location: [facility];  Service: Orthopedics;  Laterality: Bilateral;  Bilateral Adductor Block  . TRANSTHORACIC  ECHOCARDIOGRAM  03/29/2017   ef 60-65%/  trivial AR, MR, and TR/  mild LAE/ moderate RAE/  PASP 11mHg    Current Outpatient Medications  Medication Sig Dispense Refill  . acetaminophen (TYLENOL) 500 MG tablet Take 1,000 mg by mouth every 6 (six) hours as needed for moderate pain.    .Marland KitchenamLODipine (NORVASC) 10 MG tablet Take 1 tablet (10 mg total) by mouth daily. (Patient taking differently: Take 10 mg by mouth every evening. )    . dofetilide (TIKOSYN) 500 MCG capsule TAKE ONE CAPSULE BY MOUTH TWICE A DAY 60 capsule 5  . ibuprofen (ADVIL,MOTRIN) 200 MG tablet Take 400-800 mg by mouth every 6 (six) hours as needed for headache or moderate pain.    .Marland Kitchenlosartan (COZAAR) 100 MG tablet Take 100 mg by mouth daily.     . Magnesium 250 MG TABS Take 250 mg by mouth daily.     . metoprolol tartrate (LOPRESSOR) 25 MG tablet TAKE 1 TABLET BY MOUTH TWICE A DAY 180 tablet 2  . omeprazole (PRILOSEC) 40 MG capsule Take 40 mg by mouth daily.     .Alveda Reasons20 MG TABS tablet TAKE 1 TABLET BY MOUTH EVERY DAY WITH DINNER 90 tablet 0  . Zoster Vaccine Adjuvanted ([facility] injection Shingrix (PF) 50 mcg/0.5 mL intramuscular suspension, kit    . betamethasone dipropionate (DIPROLENE) 0.05 % cream APPLY TO AFFECTED AREA OF LEGS TWICE A DAY AS NEEDED FOR RASH (NOT TO FACE,GROIN, OR UNDERARMS)  3  . gentamicin cream (GARAMYCIN) 0.1 % Apply 1 application topically 2 (two) times daily. (Patient not taking: Reported on 05/22/2018) 30 g 1   No current facility-administered medications for this encounter.     Allergies  Allergen Reactions  . Bee Venom Anaphylaxis    J[place]ornet  . Other     Co pyronil per patient not sure  . Penicillins Other (See Comments)    Unknown. Childhood allergy. Has patient had a PCN reaction causing immediate rash, facial/tongue/throat swelling, SOB or lightheadedness with hypotension: Unknown Has patient had a PCN reaction causing severe rash involving mucus membranes or skin necrosis:  Unknown Has patient had a PCN reaction that required hospitalization: Unknown Has patient had a PCN reaction occurring within the last 10 years: No If all of the above answers are "NO", then may proceed with Cephalosporin use.   . Sulfa Antibiotics Other (See Comments)    Unknown. Childhood allergy.  . Tetracyclines & Related Other (See Comments)    Unknown. Childhood allergy.    Social History   Socioeconomic History  . Marital status: Married    Spouse name: [place] . Number of children: 4  . Years of education: 148 . Highest education level: Not on file  Occupational History    Comment: [employer]  Social Needs  .  Financial resource strain: Not on file  . Food insecurity:    Worry: Not on file    Inability: Not on file  . Transportation needs:    Medical: Not on file    Non-medical: Not on file  Tobacco Use  . Smoking status: Current Every Day Smoker    Packs/day: 0.50    Years: 40.00    Pack years: 20.00    Types: Cigarettes    Start date: 01/08/1977  . Smokeless tobacco: Never Used  Substance and Sexual Activity  . Alcohol use: Yes    Alcohol/week: 0.0 standard drinks    Comment: rarely  . Drug use: No  . Sexual activity: Yes  Lifestyle  . Physical activity:    Days per week: Not on file    Minutes per session: Not on file  . Stress: Not on file  Relationships  . Social connections:    Talks on phone: Not on file    Gets together: Not on file    Attends religious service: Not on file    Active member of club or organization: Not on file    Attends meetings of clubs or organizations: Not on file    Relationship status: Not on file  . Intimate partner violence:    Fear of current or ex partner: Not on file    Emotionally abused: Not on file    Physically abused: Not on file    Forced sexual activity: Not on file  Other Topics Concern  . Not on file  Social History Narrative   Lives in [place] (near [place]) with wife.   Patient 1 cup  of coffee daily,occas. 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    Family History  Problem Relation Age of Onset  . Deep vein thrombosis Father   . Stroke Mother   . Colon cancer Neg Hx     ROS- All systems are reviewed and negative except as per the HPI above  Physical Exam: Vitals:   05/22/18 1302  BP: 140/84  Pulse: 74  Weight: (!) 137 kg  Height: 6' (1.829 m)    GEN- The patient is well appearing, alert and oriented x 3 today.   Head- normocephalic, atraumatic Eyes-  Sclera clear, conjunctiva pink Ears- hearing intact Oropharynx- clear Neck- supple, no JVP Lymph- no cervical lymphadenopathy Lungs- Clear to ausculation bilaterally, normal work of breathing Heart- irregular rate and rhythm, no murmurs, rubs or gallops, PMI not laterally displaced GI- soft, NT, ND, + BS Extremities- no clubbing, cyanosis, or edema, surgical scars present both knees MS- no significant deformity or atrophy Skin- no rash or lesion Psych- euthymic mood, full affect Neuro- strength and sensation are intact  EKG- atrial flutter with variable AV block, pr int 74 bpm, qrs int 70 ms, qtc 466 ms   Assessment and Plan: 1. Persistent afib Has been doing very  well on tikosyn, staying in [place] , however today he is in atrial flutter,rate controlled, possibly 2/2 to traffic and rushing to get to appointment Continue tikosyn 500 mcg bid Continue metoprolol Continue xarelto for chadsvasc score of at least 1  Bmet/mag today   2. HTN Stable  3. Obesity Has gained weight, encouraged weight loss   F/u Tuesday for EKG, if arrhythmia persists,will plan on cardioversion   Butch Penny C. Esther Broyles, [facility] [address] [place], [place] [address] [phone]

## 2018-05-27 ENCOUNTER — Ambulatory Visit (HOSPITAL_COMMUNITY)
Admission: RE | Admit: 2018-05-27 | Discharge: 2018-05-27 | Disposition: A | Discharge status: Home / Self Care | Payer: BLUE CROSS/BLUE SHIELD | Source: Ambulatory Visit | Attending: Nurse Practitioner | Admitting: Nurse Practitioner

## 2018-05-27 VITALS — BP 132/84 | HR 60

## 2018-05-27 DIAGNOSIS — Z79899 Other long term (current) drug therapy: Secondary | ICD-10-CM | POA: Diagnosis not present

## 2018-05-27 DIAGNOSIS — M170 Bilateral primary osteoarthritis of knee: Secondary | ICD-10-CM | POA: Diagnosis not present

## 2018-05-27 DIAGNOSIS — M1990 Unspecified osteoarthritis, unspecified site: Secondary | ICD-10-CM | POA: Diagnosis not present

## 2018-05-27 DIAGNOSIS — M25561 Pain in right knee: Secondary | ICD-10-CM | POA: Insufficient documentation

## 2018-05-27 DIAGNOSIS — I10 Essential (primary) hypertension: Secondary | ICD-10-CM | POA: Diagnosis not present

## 2018-05-27 DIAGNOSIS — I4892 Unspecified atrial flutter: Secondary | ICD-10-CM | POA: Insufficient documentation

## 2018-05-27 DIAGNOSIS — F17210 Nicotine dependence, cigarettes, uncomplicated: Secondary | ICD-10-CM | POA: Diagnosis not present

## 2018-05-27 DIAGNOSIS — I484 Atypical atrial flutter: Secondary | ICD-10-CM | POA: Diagnosis not present

## 2018-05-27 DIAGNOSIS — M25562 Pain in left knee: Secondary | ICD-10-CM | POA: Diagnosis not present

## 2018-05-27 DIAGNOSIS — K219 Gastro-esophageal reflux disease without esophagitis: Secondary | ICD-10-CM | POA: Diagnosis not present

## 2018-05-27 DIAGNOSIS — I4819 Other persistent atrial fibrillation: Secondary | ICD-10-CM | POA: Diagnosis not present

## 2018-05-27 DIAGNOSIS — E669 Obesity, unspecified: Secondary | ICD-10-CM | POA: Insufficient documentation

## 2018-05-27 DIAGNOSIS — Z7901 Long term (current) use of anticoagulants: Secondary | ICD-10-CM | POA: Insufficient documentation

## 2018-05-27 LAB — CBC
HCT: 48.3 % (ref 39.0–52.0)
Hemoglobin: 15.8 g/dL (ref 13.0–17.0)
MCH: 29.6 pg (ref 26.0–34.0)
MCHC: 32.7 g/dL (ref 30.0–36.0)
MCV: 90.6 fL (ref 80.0–100.0)
PLATELETS: 258 10*3/uL (ref 150–400)
RBC: 5.33 MIL/uL (ref 4.22–5.81)
RDW: 13.4 % (ref 11.5–15.5)
WBC: 8.3 10*3/uL (ref 4.0–10.5)
nRBC: 0 % (ref 0.0–0.2)

## 2018-05-27 NOTE — Patient Instructions (Signed)
Cardioversion scheduled for Tuesday, March 3rd  - Arrive at the [facility] and go to admitting at 12:45PM  -Do not eat or drink anything after midnight the night prior to your procedure.  - Take all your morning medication with a sip of water prior to arrival.  - You will not be able to drive home after your procedure.

## 2018-05-27 NOTE — Progress Notes (Signed)
Pt in for repeat EKG to determine if he's returned to [facility].  To be reviewed by Roderic Palau, NP

## 2018-05-27 NOTE — H&P (View-Only) (Signed)
Patient ID: Sean Morales, male   DOB: 08-05-1960, 58 y.o.   MRN: [id number]     Primary Care Physician: Sharilyn Sites, MD Referring Physician: [facility] f/u EP: Dr. Merlene Morse is a 58 y.o. male with a h/o persistent afib on tikosyn therapy. He has not had any significant afib.He feels improved. No bleeding issues with xarelto.  F/u in afib clinic, 8/24, he feels well. He is starting to be more active because his knees are not bothering him as much and has lost 30 lbs and hopes to lose more. He and his wife have joined the [place] and he hopes to be more active. Continues on tikosyn and xarelto without any afib or bleeding issues.  F/u in afib clinic 03/19/17. He has not noted any afib.Continues on tikosyn. He has lost around 50- 60 lbs by his scales, weight  this am was 250 lbs before dressing. He is continuing to have knee issues and is expecting to have knee surgery in the next couple of months. He may be interested in an ablation after knee surgery is complete with maintaining SR and reduction  in weight. Will update echo. Continues on xarelto 20 mg daily with a chadsvasc score of 1. Is  using cpap.  F/u in the afib clinic, 4/18. Since I saw him last, he has had both knees replaced. He did well with the surgery and will be slowly getting back to his work/ exercise routine. He is looking at having a colonoscopy in the next few weeks. No afib during the surgery or recovery process. He continues on Tikosyn and  Xarelto.  F/u in afib clinc 8/21. He is still having some issues with back pain/ knee pain since bilateral knee replacements. Mild swelling of both ankles since then. He has also gained 25 lbs+ since his surgery since he not been as active. He has though been staying in rhythm with dofetilide.   F/u in afib clinic, 05/22/18 for Tikosyn surveillance. He unfortunately is out of rhythm today. He states that he got nervous trying to get here today with the traffic and started feeling  jumpy in his chest, otherwise has not noted anything different. Nothing is different with his health. He has struggled with his weight and is now 302 lb's.   F/u afib clinic 05/27/18. He remains out of rhythm despite feeling asymptomatic. Ekg shows atrial flutter with variable block at 60 bpm. He has not missed any anticoagulation.    Today, he denies symptoms  of shortness of breath, orthopnea, PND, lower extremity edema, dizziness, presyncope, syncope, or neurologic sequela.Positive of symptoms as  listed above.The patient is tolerating medications without difficulties and is otherwise without complaint today.   Past Medical History:  Diagnosis Date  . Bilateral knee pain   . Depression    no current meds  . DJD (degenerative joint disease)   . First degree heart block   . GERD (gastroesophageal reflux disease)   . Hypertension   . OA (osteoarthritis) of knee    bilateral  . Obstructive sleep apnea   . Persistent atrial fibrillation first dx 10/ 2015-- previous primary cardiologist, [name]   followed by atrial fib. clinic-- [name] NP   Past Surgical History:  Procedure Laterality Date  . APPENDECTOMY  age 31  . CARDIOVASCULAR STRESS TEST  01-17-2015  [name]   Low risk nuclear study w/ no ischemia/  normal LV function and wal motion,  nuclear stress ef 60%  .  CARDIOVERSION N/A 01/20/2015   Procedure: CARDIOVERSION;  Surgeon: [name], MD;  Location: [facility];  Service: Cardiovascular;  Laterality: N/A;  . COLONOSCOPY N/A 09/20/2017   Procedure: COLONOSCOPY;  Surgeon: [name], MD;  [facility];  Service: Endoscopy;  Laterality: N/A;  1:30pm  . colonscopy  age 5  . cysts removed from nipples  in high school   and neck  . ORIF RIGHT KNEE  age 72  . POLYPECTOMY  09/20/2017   Procedure: POLYPECTOMY;  Surgeon: [name], MD;  [facility];  Service: Endoscopy;;  . TONSILLECTOMY  age 12  . TOTAL KNEE ARTHROPLASTY Bilateral  05/20/2017   Procedure: TOTAL KNEE BILATERAL;  Surgeon: [name], MD;  [facility]: [facility];  Service: Orthopedics;  Laterality: Bilateral;  Bilateral Adductor Block  . TRANSTHORACIC ECHOCARDIOGRAM  03/29/2017   ef 60-65%/  trivial AR, MR, and TR/  mild LAE/ moderate RAE/  PASP 27mHg    Current Outpatient Medications  Medication Sig Dispense Refill  . acetaminophen (TYLENOL) 500 MG tablet Take 1,000 mg by mouth every 6 (six) hours as needed for moderate pain.    .Marland KitchenamLODipine (NORVASC) 10 MG tablet Take 1 tablet (10 mg total) by mouth daily. (Patient taking differently: Take 10 mg by mouth every evening. )    . betamethasone dipropionate (DIPROLENE) 0.05 % cream APPLY TO AFFECTED AREA OF LEGS TWICE A DAY AS NEEDED FOR RASH (NOT TO FACE,GROIN, OR UNDERARMS)  3  . dofetilide (TIKOSYN) 500 MCG capsule TAKE ONE CAPSULE BY MOUTH TWICE A DAY 60 capsule 5  . gentamicin cream (GARAMYCIN) 0.1 % Apply 1 application topically 2 (two) times daily. (Patient not taking: Reported on 05/22/2018) 30 g 1  . ibuprofen (ADVIL,MOTRIN) 200 MG tablet Take 400-800 mg by mouth every 6 (six) hours as needed for headache or moderate pain.    .Marland Kitchenlosartan (COZAAR) 100 MG tablet Take 100 mg by mouth daily.     . Magnesium 250 MG TABS Take 250 mg by mouth daily.     . metoprolol tartrate (LOPRESSOR) 25 MG tablet TAKE 1 TABLET BY MOUTH TWICE A DAY 180 tablet 2  . omeprazole (PRILOSEC) 40 MG capsule Take 40 mg by mouth daily.     .Alveda Reasons20 MG TABS tablet TAKE 1 TABLET BY MOUTH EVERY DAY WITH DINNER 90 tablet 0  . Zoster Vaccine Adjuvanted ([facility] injection Shingrix (PF) 50 mcg/0.5 mL intramuscular suspension, kit     No current facility-administered medications for this encounter.     Allergies  Allergen Reactions  . Bee Venom Anaphylaxis    [place]ornet  . Other     Co pyronil per patient not sure  . Penicillins Other (See Comments)    Unknown. Childhood allergy. Has patient had a PCN reaction causing  immediate rash, facial/tongue/throat swelling, SOB or lightheadedness with hypotension: Unknown Has patient had a PCN reaction causing severe rash involving mucus membranes or skin necrosis: Unknown Has patient had a PCN reaction that required hospitalization: Unknown Has patient had a PCN reaction occurring within the last 10 years: No If all of the above answers are "NO", then may proceed with Cephalosporin use.   . Sulfa Antibiotics Other (See Comments)    Unknown. Childhood allergy.  . Tetracyclines & Related Other (See Comments)    Unknown. Childhood allergy.    Social History   Socioeconomic History  . Marital status: Married    Spouse name: [place] . Number of  children: 4  . Years of education: 96  . Highest education level: Not on file  Occupational History    Comment: [employer]  Social Needs  . Financial resource strain: Not on file  . Food insecurity:    Worry: Not on file    Inability: Not on file  . Transportation needs:    Medical: Not on file    Non-medical: Not on file  Tobacco Use  . Smoking status: Current Every Day Smoker    Packs/day: 0.50    Years: 40.00    Pack years: 20.00    Types: Cigarettes    Start date: 01/08/1977  . Smokeless tobacco: Never Used  Substance and Sexual Activity  . Alcohol use: Yes    Alcohol/week: 0.0 standard drinks    Comment: rarely  . Drug use: No  . Sexual activity: Yes  Lifestyle  . Physical activity:    Days per week: Not on file    Minutes per session: Not on file  . Stress: Not on file  Relationships  . Social connections:    Talks on phone: Not on file    Gets together: Not on file    Attends religious service: Not on file    Active member of club or organization: Not on file    Attends meetings of clubs or organizations: Not on file    Relationship status: Not on file  . Intimate partner violence:    Fear of current or ex partner: Not on file    Emotionally abused: Not on file    Physically  abused: Not on file    Forced sexual activity: Not on file  Other Topics Concern  . Not on file  Social History Narrative   Lives in [place] (near [place]) with wife.   Patient 1 cup of coffee daily,occas. 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    Family History  Problem Relation Age of Onset  . Deep vein thrombosis Father   . Stroke Mother   . Colon cancer Neg Hx     ROS- All systems are reviewed and negative except as per the HPI above  Physical Exam: Vitals:   05/27/18 1518  BP: 132/84  Pulse: 60    GEN- The patient is well appearing, alert and oriented x 3 today.   Head- normocephalic, atraumatic Eyes-  Sclera clear, conjunctiva pink Ears- hearing intact Oropharynx- clear Neck- supple, no JVP Lymph- no cervical lymphadenopathy Lungs- Clear to ausculation bilaterally, normal work of breathing Heart- irregular rate and rhythm, no murmurs, rubs or gallops, PMI not laterally displaced GI- soft, NT, ND, + BS Extremities- no clubbing, cyanosis, or edema, surgical scars present both knees MS- no significant deformity or atrophy Skin- no rash or lesion Psych- euthymic mood, full affect Neuro- strength and sensation are intact  EKG- atrial flutter with variable AV block, pr int 60 bpm, qrs int 84 ms, qtc 442 ms   Assessment and Plan: 1. Persistent aflutter Has been doing very  well on Tikosyn staying in [place] , however he appears to be in  persistent  atrial flutter,rate controlled, unknown trigger Continue tikosyn 500 mcg bid Continue metoprolol Continue xarelto for chadsvasc score of at least 1, no missed doses, cardioversion discussed, and he wants to procced CBC today   2. HTN Stable  3. Obesity Has gained weight, encouraged weight loss    f/u in afib clinic one week after cardioversion   Butch Penny C. Kayleen Memos, ANP-C Afib  [facility] [address] [place], [place] [address] [phone]

## 2018-05-27 NOTE — Progress Notes (Addendum)
Patient ID: Sean Morales, male   DOB: 08-05-1960, 58 y.o.   MRN: [id number]     Primary Care Physician: Sharilyn Sites, MD Referring Physician: [facility] f/u EP: Dr. Merlene Morse is a 58 y.o. male with a h/o persistent afib on tikosyn therapy. He has not had any significant afib.He feels improved. No bleeding issues with xarelto.  F/u in afib clinic, 8/24, he feels well. He is starting to be more active because his knees are not bothering him as much and has lost 30 lbs and hopes to lose more. He and his wife have joined the [employer] and he hopes to be more active. Continues on tikosyn and xarelto without any afib or bleeding issues.  F/u in afib clinic 03/19/17. He has not noted any afib.Continues on tikosyn. He has lost around 50- 60 lbs by his scales, weight  this am was 250 lbs before dressing. He is continuing to have knee issues and is expecting to have knee surgery in the next couple of months. He may be interested in an ablation after knee surgery is complete with maintaining SR and reduction  in weight. Will update echo. Continues on xarelto 20 mg daily with a chadsvasc score of 1. Is  using cpap.  F/u in the afib clinic, 4/18. Since I saw him last, he has had both knees replaced. He did well with the surgery and will be slowly getting back to his work/ exercise routine. He is looking at having a colonoscopy in the next few weeks. No afib during the surgery or recovery process. He continues on Tikosyn and  Xarelto.  F/u in afib clinc 8/21. He is still having some issues with back pain/ knee pain since bilateral knee replacements. Mild swelling of both ankles since then. He has also gained 25 lbs+ since his surgery since he not been as active. He has though been staying in rhythm with dofetilide.   F/u in afib clinic, 05/22/18 for Tikosyn surveillance. He unfortunately is out of rhythm today. He states that he got nervous trying to get here today with the traffic and started feeling  jumpy in his chest, otherwise has not noted anything different. Nothing is different with his health. He has struggled with his weight and is now 302 lb's.   F/u afib clinic 05/27/18. He remains out of rhythm despite feeling asymptomatic. Ekg shows atrial flutter with variable block at 60 bpm. He has not missed any anticoagulation.    Today, he denies symptoms  of shortness of breath, orthopnea, PND, lower extremity edema, dizziness, presyncope, syncope, or neurologic sequela.Positive of symptoms as  listed above.The patient is tolerating medications without difficulties and is otherwise without complaint today.   Past Medical History:  Diagnosis Date  . Bilateral knee pain   . Depression    no current meds  . DJD (degenerative joint disease)   . First degree heart block   . GERD (gastroesophageal reflux disease)   . Hypertension   . OA (osteoarthritis) of knee    bilateral  . Obstructive sleep apnea   . Persistent atrial fibrillation first dx 10/ 2015-- previous primary cardiologist, [name]   followed by atrial fib. clinic-- [name] NP   Past Surgical History:  Procedure Laterality Date  . APPENDECTOMY  age 31  . CARDIOVASCULAR STRESS TEST  01-17-2015  [name]   Low risk nuclear study w/ no ischemia/  normal LV function and wal motion,  nuclear stress ef 60%  .  CARDIOVERSION N/A 01/20/2015   Procedure: CARDIOVERSION;  Surgeon: [name], MD;  Location: [facility];  Service: Cardiovascular;  Laterality: N/A;  . COLONOSCOPY N/A 09/20/2017   Procedure: COLONOSCOPY;  Surgeon: [name], MD;  Location: [facility];  Service: Endoscopy;  Laterality: N/A;  1:30pm  . colonscopy  age 5  . cysts removed from nipples  in high school   and neck  . ORIF RIGHT KNEE  age 72  . POLYPECTOMY  09/20/2017   Procedure: POLYPECTOMY;  Surgeon: [name], MD;  Location: [facility];  Service: Endoscopy;;  . TONSILLECTOMY  age 12  . TOTAL KNEE ARTHROPLASTY Bilateral  05/20/2017   Procedure: TOTAL KNEE BILATERAL;  Surgeon: [name], MD;  Location: [facility];  Service: Orthopedics;  Laterality: Bilateral;  Bilateral Adductor Block  . TRANSTHORACIC ECHOCARDIOGRAM  03/29/2017   ef 60-65%/  trivial AR, MR, and TR/  mild LAE/ moderate RAE/  PASP 27mHg    Current Outpatient Medications  Medication Sig Dispense Refill  . acetaminophen (TYLENOL) 500 MG tablet Take 1,000 mg by mouth every 6 (six) hours as needed for moderate pain.    .Marland KitchenamLODipine (NORVASC) 10 MG tablet Take 1 tablet (10 mg total) by mouth daily. (Patient taking differently: Take 10 mg by mouth every evening. )    . betamethasone dipropionate (DIPROLENE) 0.05 % cream APPLY TO AFFECTED AREA OF LEGS TWICE A DAY AS NEEDED FOR RASH (NOT TO FACE,GROIN, OR UNDERARMS)  3  . dofetilide (TIKOSYN) 500 MCG capsule TAKE ONE CAPSULE BY MOUTH TWICE A DAY 60 capsule 5  . gentamicin cream (GARAMYCIN) 0.1 % Apply 1 application topically 2 (two) times daily. (Patient not taking: Reported on 05/22/2018) 30 g 1  . ibuprofen (ADVIL,MOTRIN) 200 MG tablet Take 400-800 mg by mouth every 6 (six) hours as needed for headache or moderate pain.    .Marland Kitchenlosartan (COZAAR) 100 MG tablet Take 100 mg by mouth daily.     . Magnesium 250 MG TABS Take 250 mg by mouth daily.     . metoprolol tartrate (LOPRESSOR) 25 MG tablet TAKE 1 TABLET BY MOUTH TWICE A DAY 180 tablet 2  . omeprazole (PRILOSEC) 40 MG capsule Take 40 mg by mouth daily.     .Alveda Reasons20 MG TABS tablet TAKE 1 TABLET BY MOUTH EVERY DAY WITH DINNER 90 tablet 0  . Zoster Vaccine Adjuvanted ([facility] injection Shingrix (PF) 50 mcg/0.5 mL intramuscular suspension, kit     No current facility-administered medications for this encounter.     Allergies  Allergen Reactions  . Bee Venom Anaphylaxis    [place]  . Other     Co pyronil per patient not sure  . Penicillins Other (See Comments)    Unknown. Childhood allergy. Has patient had a PCN reaction causing  immediate rash, facial/tongue/throat swelling, SOB or lightheadedness with hypotension: Unknown Has patient had a PCN reaction causing severe rash involving mucus membranes or skin necrosis: Unknown Has patient had a PCN reaction that required hospitalization: Unknown Has patient had a PCN reaction occurring within the last 10 years: No If all of the above answers are "NO", then may proceed with Cephalosporin use.   . Sulfa Antibiotics Other (See Comments)    Unknown. Childhood allergy.  . Tetracyclines & Related Other (See Comments)    Unknown. Childhood allergy.    Social History   Socioeconomic History  . Marital status: Married    Spouse name: [place] . Number of  children: 4  . Years of education: 14  . Highest education level: Not on file  Occupational History    Comment: [employer]  Social Needs  . Financial resource strain: Not on file  . Food insecurity:    Worry: Not on file    Inability: Not on file  . Transportation needs:    Medical: Not on file    Non-medical: Not on file  Tobacco Use  . Smoking status: Current Every Day Smoker    Packs/day: 0.50    Years: 40.00    Pack years: 20.00    Types: Cigarettes    Start date: 01/08/1977  . Smokeless tobacco: Never Used  Substance and Sexual Activity  . Alcohol use: Yes    Alcohol/week: 0.0 standard drinks    Comment: rarely  . Drug use: No  . Sexual activity: Yes  Lifestyle  . Physical activity:    Days per week: Not on file    Minutes per session: Not on file  . Stress: Not on file  Relationships  . Social connections:    Talks on phone: Not on file    Gets together: Not on file    Attends religious service: Not on file    Active member of club or organization: Not on file    Attends meetings of clubs or organizations: Not on file    Relationship status: Not on file  . Intimate partner violence:    Fear of current or ex partner: Not on file    Emotionally abused: Not on file    Physically  abused: Not on file    Forced sexual activity: Not on file  Other Topics Concern  . Not on file  Social History Narrative   Lives in [place] (near [place]) with wife.   Patient 1 cup of coffee daily,occas. Soft drink   Operations supervisor for [employer]    Family History  Problem Relation Age of Onset  . Deep vein thrombosis Father   . Stroke Mother   . Colon cancer Neg Hx     ROS- All systems are reviewed and negative except as per the HPI above  Physical Exam: Vitals:   05/27/18 1518  BP: 132/84  Pulse: 60    GEN- The patient is well appearing, alert and oriented x 3 today.   Head- normocephalic, atraumatic Eyes-  Sclera clear, conjunctiva pink Ears- hearing intact Oropharynx- clear Neck- supple, no JVP Lymph- no cervical lymphadenopathy Lungs- Clear to ausculation bilaterally, normal work of breathing Heart- irregular rate and rhythm, no murmurs, rubs or gallops, PMI not laterally displaced GI- soft, NT, ND, + BS Extremities- no clubbing, cyanosis, or edema, surgical scars present both knees MS- no significant deformity or atrophy Skin- no rash or lesion Psych- euthymic mood, full affect Neuro- strength and sensation are intact  EKG- atrial flutter with variable AV block, pr int 60 bpm, qrs int 84 ms, qtc 442 ms   Assessment and Plan: 1. Persistent aflutter Has been doing very  well on Tikosyn staying in SR , however he appears to be in  persistent  atrial flutter,rate controlled, unknown trigger Continue tikosyn 500 mcg bid Continue metoprolol Continue xarelto for chadsvasc score of at least 1, no missed doses, cardioversion discussed, and he wants to procced CBC today   2. HTN Stable  3. Obesity Has gained weight, encouraged weight loss    f/u in afib clinic one week after cardioversion   Quintrell Baze C. Fenton Candee, ANP-C Afib   [facility] [address] [place], [place] [address] [phone]

## 2018-06-03 ENCOUNTER — Other Ambulatory Visit: Payer: Self-pay

## 2018-06-03 ENCOUNTER — Ambulatory Visit (HOSPITAL_COMMUNITY): Payer: BLUE CROSS/BLUE SHIELD | Admitting: Anesthesiology

## 2018-06-03 ENCOUNTER — Ambulatory Visit (HOSPITAL_COMMUNITY)
Admission: RE | Admit: 2018-06-03 | Discharge: 2018-06-03 | Disposition: A | Discharge status: Home / Self Care | Payer: BLUE CROSS/BLUE SHIELD | Attending: Cardiology | Admitting: Cardiology

## 2018-06-03 ENCOUNTER — Encounter (HOSPITAL_COMMUNITY)
Admission: RE | Disposition: A | Discharge status: Home / Self Care | Payer: Self-pay | Source: Home / Self Care | Attending: Cardiology

## 2018-06-03 ENCOUNTER — Encounter (HOSPITAL_COMMUNITY): Payer: Self-pay | Admitting: Certified Registered Nurse Anesthetist

## 2018-06-03 DIAGNOSIS — Z791 Long term (current) use of non-steroidal anti-inflammatories (NSAID): Secondary | ICD-10-CM | POA: Insufficient documentation

## 2018-06-03 DIAGNOSIS — Z6839 Body mass index (BMI) 39.0-39.9, adult: Secondary | ICD-10-CM | POA: Insufficient documentation

## 2018-06-03 DIAGNOSIS — Z7901 Long term (current) use of anticoagulants: Secondary | ICD-10-CM | POA: Insufficient documentation

## 2018-06-03 DIAGNOSIS — I4819 Other persistent atrial fibrillation: Secondary | ICD-10-CM | POA: Diagnosis not present

## 2018-06-03 DIAGNOSIS — K219 Gastro-esophageal reflux disease without esophagitis: Secondary | ICD-10-CM | POA: Insufficient documentation

## 2018-06-03 DIAGNOSIS — E669 Obesity, unspecified: Secondary | ICD-10-CM | POA: Diagnosis not present

## 2018-06-03 DIAGNOSIS — F329 Major depressive disorder, single episode, unspecified: Secondary | ICD-10-CM | POA: Diagnosis not present

## 2018-06-03 DIAGNOSIS — G4733 Obstructive sleep apnea (adult) (pediatric): Secondary | ICD-10-CM | POA: Insufficient documentation

## 2018-06-03 DIAGNOSIS — F17210 Nicotine dependence, cigarettes, uncomplicated: Secondary | ICD-10-CM | POA: Insufficient documentation

## 2018-06-03 DIAGNOSIS — I4892 Unspecified atrial flutter: Secondary | ICD-10-CM

## 2018-06-03 DIAGNOSIS — I10 Essential (primary) hypertension: Secondary | ICD-10-CM | POA: Insufficient documentation

## 2018-06-03 DIAGNOSIS — Z882 Allergy status to sulfonamides status: Secondary | ICD-10-CM | POA: Diagnosis not present

## 2018-06-03 DIAGNOSIS — Z79899 Other long term (current) drug therapy: Secondary | ICD-10-CM | POA: Diagnosis not present

## 2018-06-03 DIAGNOSIS — I4891 Unspecified atrial fibrillation: Secondary | ICD-10-CM | POA: Diagnosis not present

## 2018-06-03 HISTORY — PX: CARDIOVERSION: SHX1299

## 2018-06-03 SURGERY — CARDIOVERSION
Anesthesia: General

## 2018-06-03 MED ORDER — DOFETILIDE 500 MCG PO CAPS: 500.0000 ug | ORAL_CAPSULE | Freq: Two times a day (BID) | ORAL | Status: DC

## 2018-06-03 MED ORDER — RIVAROXABAN 20 MG PO TABS: 20.0000 mg | ORAL_TABLET | Freq: Every day | ORAL | Status: DC

## 2018-06-03 MED ORDER — LIDOCAINE 2% (20 MG/ML) 5 ML SYRINGE
INTRAMUSCULAR | Status: DC | PRN
  Administered 2018-06-03: 40 mg via INTRAVENOUS

## 2018-06-03 MED ORDER — METOPROLOL TARTRATE 25 MG PO TABS: 25.0000 mg | ORAL_TABLET | Freq: Two times a day (BID) | ORAL | Status: DC

## 2018-06-03 MED ORDER — PROPOFOL 10 MG/ML IV BOLUS
INTRAVENOUS | Status: DC | PRN
  Administered 2018-06-03: 100 mg via INTRAVENOUS

## 2018-06-03 MED ORDER — AMLODIPINE BESYLATE 10 MG PO TABS: 10.0000 mg | ORAL_TABLET | Freq: Every evening | ORAL | Status: DC

## 2018-06-03 NOTE — Anesthesia Preprocedure Evaluation (Addendum)
Anesthesia Evaluation  Patient identified by MRN, date of birth, ID band Patient awake    Reviewed: Allergy & Precautions, NPO status , Patient's Chart, lab work & pertinent test results, reviewed documented beta blocker date and time   History of Anesthesia Complications Negative for: history of anesthetic complications  Airway Mallampati: II  TM Distance: >3 FB Neck ROM: Full    Dental  (+) Dental Advisory Given, Missing,    Pulmonary sleep apnea and Continuous Positive Airway Pressure Ventilation , Current Smoker,    Pulmonary exam normal breath sounds clear to auscultation       Cardiovascular hypertension, Pt. on medications and Pt. on home beta blockers Normal cardiovascular exam+ dysrhythmias (on Xarelto) Atrial Fibrillation  Rhythm:Regular Rate:Normal  TTE 2018: EF 60-65%, aortic sclerosis without stenosis, mild LAE, moderate RAE   Neuro/Psych Depression negative neurological ROS     GI/Hepatic Neg liver ROS, GERD  Medicated and Controlled,  Endo/Other  Morbid obesity  Renal/GU negative Renal ROS     Musculoskeletal  (+) Arthritis ,   Abdominal   Peds  Hematology negative hematology ROS (+)   Anesthesia Other Findings Day of surgery medications reviewed with the patient.  Reproductive/Obstetrics                           Anesthesia Physical Anesthesia Plan  ASA: III  Anesthesia Plan: General   Post-op Pain Management:    Induction: Intravenous  PONV Risk Score and Plan: Treatment may vary due to age or medical condition and Propofol infusion  Airway Management Planned: Mask  Additional Equipment: None  Intra-op Plan:   Post-operative Plan:   Informed Consent: I have reviewed the patients History and Physical, chart, labs and discussed the procedure including the risks, benefits and alternatives for the proposed anesthesia with the patient or authorized representative  who has indicated his/her understanding and acceptance.     Dental advisory given  Plan Discussed with: CRNA  Anesthesia Plan Comments:        Anesthesia Quick Evaluation

## 2018-06-03 NOTE — Interval H&P Note (Signed)
History and Physical Interval Note:  06/03/2018 1:12 PM  Sean Morales  has presented today for surgery, with the diagnosis of afib  The various methods of treatment have been discussed with the patient and family. After consideration of risks, benefits and other options for treatment, the patient has consented to  Procedure(s): CARDIOVERSION (N/A) as a surgical intervention .  The patient's history has been reviewed, patient examined, no change in status, stable for surgery.  I have reviewed the patient's chart and labs.  Questions were answered to the patient's satisfaction.     Fransico Him

## 2018-06-03 NOTE — CV Procedure (Addendum)
   Electrical Cardioversion Procedure Note [name] [id number] 14-Apr-1960  Procedure: Electrical Cardioversion Indications:  Atrial Fibrillation  Time Out: Verified patient identification, verified procedure,medications/allergies/relevent history reviewed, required imaging and test results available.  Performed  Procedure Details  The patient was NPO after midnight. Anesthesia was administered at the beside  by Dr.Howze with 100mg  of propofol and 40mg  of Lidocaine.  Cardioversion was done with synchronized biphasic defibrillation with AP pads with 150watts.  The patient converted to normal sinus rhythm. The patient tolerated the procedure well   IMPRESSION:  Successful cardioversion of atrial fibrillation  Sean Morales 06/03/2018, 1:12 PM   ADDENDUM:  About 10 minutes after DCCV patient converted to atrial flutter with CVR.  Will have patient followup in afib clinic. For further recommendations.  Fransico Him, MD

## 2018-06-03 NOTE — Transfer of Care (Signed)
Immediate Anesthesia Transfer of Care Note  Patient: Sean Morales  Procedure(s) Performed: CARDIOVERSION (N/A )  Patient Location: Endoscopy Unit  Anesthesia Type:General  Level of Consciousness: awake and patient cooperative  Airway & Oxygen Therapy: Patient Spontanous Breathing  Post-op Assessment: Report given to RN and Post -op Vital signs reviewed and stable  Post vital signs: Reviewed and stable  Last Vitals:  Vitals Value Taken Time  BP 150/89 06/03/2018  1:38 PM  Temp    Pulse 50 06/03/2018  1:39 PM  Resp 16 06/03/2018  1:39 PM  SpO2 98 % 06/03/2018  1:39 PM    Last Pain:  Vitals:   06/03/18 1253  TempSrc: Oral  PainSc: 1          Complications: No apparent anesthesia complications

## 2018-06-03 NOTE — Anesthesia Postprocedure Evaluation (Signed)
Anesthesia Post Note  Patient: Sean Morales  Procedure(s) Performed: CARDIOVERSION (N/A )     Patient location during evaluation: PACU Anesthesia Type: General Level of consciousness: awake and alert Pain management: pain level controlled Vital Signs Assessment: post-procedure vital signs reviewed and stable Respiratory status: spontaneous breathing, nonlabored ventilation and respiratory function stable Cardiovascular status: blood pressure returned to baseline and stable Postop Assessment: no apparent nausea or vomiting Anesthetic complications: no    Last Vitals:  Vitals:   06/03/18 1338 06/03/18 1339  BP: (!) 150/89   Pulse: 71 (!) 50  Resp: (!) 27 16  Temp:    SpO2: 98% 98%    Last Pain:  Vitals:   06/03/18 1253  TempSrc: Oral  PainSc: [place]

## 2018-06-06 ENCOUNTER — Encounter (HOSPITAL_COMMUNITY): Payer: Self-pay | Admitting: Cardiology

## 2018-06-10 ENCOUNTER — Ambulatory Visit (HOSPITAL_COMMUNITY)
Admission: RE | Admit: 2018-06-10 | Discharge: 2018-06-10 | Disposition: A | Discharge status: Home / Self Care | Payer: BLUE CROSS/BLUE SHIELD | Source: Ambulatory Visit | Attending: Nurse Practitioner | Admitting: Nurse Practitioner

## 2018-06-10 VITALS — BP 136/84 | HR 75

## 2018-06-10 DIAGNOSIS — F17210 Nicotine dependence, cigarettes, uncomplicated: Secondary | ICD-10-CM | POA: Diagnosis not present

## 2018-06-10 DIAGNOSIS — G4733 Obstructive sleep apnea (adult) (pediatric): Secondary | ICD-10-CM | POA: Insufficient documentation

## 2018-06-10 DIAGNOSIS — I10 Essential (primary) hypertension: Secondary | ICD-10-CM | POA: Diagnosis not present

## 2018-06-10 DIAGNOSIS — K219 Gastro-esophageal reflux disease without esophagitis: Secondary | ICD-10-CM | POA: Insufficient documentation

## 2018-06-10 DIAGNOSIS — I440 Atrioventricular block, first degree: Secondary | ICD-10-CM | POA: Diagnosis not present

## 2018-06-10 DIAGNOSIS — Z882 Allergy status to sulfonamides status: Secondary | ICD-10-CM | POA: Insufficient documentation

## 2018-06-10 DIAGNOSIS — Z79899 Other long term (current) drug therapy: Secondary | ICD-10-CM | POA: Insufficient documentation

## 2018-06-10 DIAGNOSIS — I4891 Unspecified atrial fibrillation: Secondary | ICD-10-CM

## 2018-06-10 DIAGNOSIS — Z880 Allergy status to penicillin: Secondary | ICD-10-CM | POA: Insufficient documentation

## 2018-06-10 DIAGNOSIS — Z823 Family history of stroke: Secondary | ICD-10-CM | POA: Diagnosis not present

## 2018-06-10 DIAGNOSIS — E669 Obesity, unspecified: Secondary | ICD-10-CM | POA: Insufficient documentation

## 2018-06-10 DIAGNOSIS — I4819 Other persistent atrial fibrillation: Secondary | ICD-10-CM | POA: Diagnosis not present

## 2018-06-10 DIAGNOSIS — I4892 Unspecified atrial flutter: Secondary | ICD-10-CM | POA: Diagnosis not present

## 2018-06-10 DIAGNOSIS — Z888 Allergy status to other drugs, medicaments and biological substances status: Secondary | ICD-10-CM | POA: Insufficient documentation

## 2018-06-10 DIAGNOSIS — Z7901 Long term (current) use of anticoagulants: Secondary | ICD-10-CM | POA: Insufficient documentation

## 2018-06-10 NOTE — Progress Notes (Signed)
Patient ID: Sean Morales, male   DOB: [birth year], 58 y.o.   MRN: [id number]     Primary Care Physician: Sharilyn Sites, MD Referring Physician:  Dr. Rayann Heman EP: Dr. Merlene Morse is a 58 y.o. male with a h/o persistent afib on tikosyn therapy/xarelto. He has done well since 2016, when loaded on Tikosyn   until the last several weeks when he went back into afib/flutter. He had amiodarone in late 2015 and failed cardioversion. He had been persistent afib for a year at that time. He was seen by Dr. Rayann Heman 05/2014 who initiated tikosyn.   He returns to the clinic today for f/u cardioversion 06/03/18.Marland Kitchen He had SR for about 10 mins and then returned to afib. He is here to discuss further options. Is very religious to wear cpap for treatment of OSA. Struggles with weight loss. Symptomatic with fatigue and shortness of breath in afib.  Today, he denies symptoms  of shortness of breath, orthopnea, PND, lower extremity edema, dizziness, presyncope, syncope, or neurologic sequela.Positive of symptoms as  listed above.The patient is tolerating medications without difficulties and is otherwise without complaint today.   Past Medical History:  Diagnosis Date  . Bilateral knee pain   . Depression    no current meds  . DJD (degenerative joint disease)   . First degree heart block   . GERD (gastroesophageal reflux disease)   . Hypertension   . OA (osteoarthritis) of knee    bilateral  . Obstructive sleep apnea   . Persistent atrial fibrillation first dx 10/ 2015-- previous primary cardiologist, [name]   followed by atrial fib. clinic-- [name] NP   Past Surgical History:  Procedure Laterality Date  . APPENDECTOMY  age 88  . CARDIOVASCULAR STRESS TEST  01-17-2015  [name]   Low risk nuclear study w/ no ischemia/  normal LV function and wal motion,  nuclear stress ef 60%  . CARDIOVERSION N/A 01/20/2015   Procedure: CARDIOVERSION;  Surgeon: [name], MD;   Location: [facility];  Service: Cardiovascular;  Laterality: N/A;  . CARDIOVERSION N/A 06/03/2018   Procedure: CARDIOVERSION;  Surgeon: [name], MD;  Location: [facility] ENDOSCOPY;  Service: Cardiovascular;  Laterality: N/A;  . COLONOSCOPY N/A 09/20/2017   Procedure: COLONOSCOPY;  Surgeon: [name], MD;  Location: [facility] SU[facility];  Service: Endoscopy;  Laterality: N/A;  1:30pm  . colonscopy  age 36  . cysts removed from nipples  in high school   and neck  . ORIF RIGHT KNEE  age 52  . POLYPECTOMY  09/20/2017   Procedure: POLYPECTOMY;  Surgeon: [name], MD;  Location: [facility];  Service: Endoscopy;;  . TONSILLECTOMY  age 50  . TOTAL KNEE ARTHROPLASTY Bilateral 05/20/2017   Procedure: TOTAL KNEE BILATERAL;  Surgeon: [name], MD;  Location: [facility];  Service: Orthopedics;  Laterality: Bilateral;  Bilateral Adductor Block  . TRANSTHORACIC ECHOCARDIOGRAM  03/29/2017   ef 60-65%/  trivial AR, MR, and TR/  mild LAE/ moderate RAE/  PASP 34mmHg    Current Outpatient Medications  Medication Sig Dispense Refill  . acetaminophen (TYLENOL) 500 MG tablet Take 1,000 mg by mouth every 6 (six) hours as needed for moderate pain.    Marland Kitchen amLODipine (NORVASC) 10 MG tablet Take 1 tablet (10 mg total) by mouth every evening.    . betamethasone dipropionate (DIPROLENE) 0.05 % cream Apply 1 application topically 2 (two) times daily as needed (irritation).   3  .  dofetilide (TIKOSYN) 500 MCG capsule Take 1 capsule (500 mcg total) by mouth 2 (two) times daily.    Marland Kitchen ibuprofen (ADVIL,MOTRIN) 200 MG tablet Take 400-800 mg by mouth every 6 (six) hours as needed for headache or moderate pain.    Marland Kitchen losartan (COZAAR) 100 MG tablet Take 100 mg by mouth daily.     . Magnesium 250 MG TABS Take 250 mg by mouth daily.     Marland Kitchen omeprazole (PRILOSEC) 40 MG capsule Take 40 mg by mouth daily.     . rivaroxaban (XARELTO) 20 MG TABS tablet Take 1 tablet (20 mg total) by mouth daily with supper.    . metoprolol tartrate  (LOPRESSOR) 25 MG tablet Take 1 tablet (25 mg total) by mouth 2 (two) times daily.     No current facility-administered medications for this encounter.     Allergies  Allergen Reactions  . Bee Venom Anaphylaxis    [place]  . Other     Co pyronil per patient not sure  . Penicillins Other (See Comments)    Unknown. Childhood allergy. Has patient had a PCN reaction causing immediate rash, facial/tongue/throat swelling, SOB or lightheadedness with hypotension: Unknown Has patient had a PCN reaction causing severe rash involving mucus membranes or skin necrosis: Unknown Has patient had a PCN reaction that required hospitalization: Unknown Has patient had a PCN reaction occurring within the last 10 years: No If all of the above answers are "NO", then may proceed with Cephalosporin use.   . Sulfa Antibiotics Other (See Comments)    Unknown. Childhood allergy.  . Tetracyclines & Related Other (See Comments)    Unknown. Childhood allergy.    Social History   Socioeconomic History  . Marital status: Married    Spouse name: [place]  . Number of children: 4  . Years of education: 20  . Highest education level: Not on file  Occupational History    Comment: [employer]  Social Needs  . Financial resource strain: Not on file  . Food insecurity:    Worry: Not on file    Inability: Not on file  . Transportation needs:    Medical: Not on file    Non-medical: Not on file  Tobacco Use  . Smoking status: Current Every Day Smoker    Packs/day: 0.50    Years: 40.00    Pack years: 20.00    Types: Cigarettes    Start date: 01/08/1977  . Smokeless tobacco: Never Used  Substance and Sexual Activity  . Alcohol use: Yes    Alcohol/week: 0.0 standard drinks    Comment: rarely  . Drug use: No  . Sexual activity: Yes  Lifestyle  . Physical activity:    Days per week: Not on file    Minutes per session: Not on file  . Stress: Not on file  Relationships  . Social  connections:    Talks on phone: Not on file    Gets together: Not on file    Attends religious service: Not on file    Active member of club or organization: Not on file    Attends meetings of clubs or organizations: Not on file    Relationship status: Not on file  . Intimate partner violence:    Fear of current or ex partner: Not on file    Emotionally abused: Not on file    Physically abused: Not on file    Forced sexual activity: Not on file  Other Topics Concern  .  Not on file  Social History Narrative   Lives in [place] (near [place]) with wife.   Patient 1 cup of coffee daily,occas. 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    Family History  Problem Relation Age of Onset  . Deep vein thrombosis Father   . Stroke Mother   . Colon cancer Neg Hx     ROS- All systems are reviewed and negative except as per the HPI above  Physical Exam: Vitals:   06/10/18 1534  BP: 136/84  Pulse: 75    GEN- The patient is well appearing, alert and oriented x 3 today.   Head- normocephalic, atraumatic Eyes-  Sclera clear, conjunctiva pink Ears- hearing intact Oropharynx- clear Neck- supple, no JVP Lymph- no cervical lymphadenopathy Lungs- Clear to ausculation bilaterally, normal work of breathing Heart- irregular rate and rhythm, no murmurs, rubs or gallops, PMI not laterally displaced GI- soft, NT, ND, + BS Extremities- no clubbing, cyanosis, or edema, surgical scars present both knees MS- no significant deformity or atrophy Skin- no rash or lesion Psych- euthymic mood, full affect Neuro- strength and sensation are intact  EKG- atrial flutter with variable AV block, pr int 74 bpm, qrs int 70 ms, qtc 466 ms Echo- 2018-Study Conclusions  - Left ventricle: The cavity size was normal. Wall thickness was   normal. Systolic function was normal. The estimated ejection   fraction was in the range of 60% to 65%. Mildly reduced GLS at   -16%. Wall motion was  normal; there were no regional wall motion   abnormalities. Left ventricular diastolic function parameters   were normal. - Aortic valve: Trileaflet. Sclerosis without stenosis. There was   trivial regurgitation. - Mitral valve: Mildly thickened leaflets . There was trivial   regurgitation. - Left atrium: The atrium was mildly dilated. - Right atrium: Moderately dilated. - Tricuspid valve: There was trivial regurgitation. - Pulmonary arteries: PA peak pressure: 33 mm Hg (S). - Inferior vena cava: The vessel was dilated. The respirophasic   diameter changes were blunted (< 50%), consistent with elevated   central venous pressure.  Impressions:  - Compared to a prior study in 2015, the LVEF is higher at 41-93% -   diastolic function is normal. There is moderate RAE and mild LAE.  Assessment and Plan: 1. Persistent afib/flutter Has been doing very  well on tikosyn, staying in SR for many years, however, has had return of afib on tikosyn for a few weeks, with recent successful cardioversion x 10 mins, then went back to afib  Discussed options Will update echo and refer to Dr. Rayann Heman to be considered for ablation  Continue tikosyn 500 mcg bid Continue metoprolol Continue xarelto for chadsvasc score of at least 1    2. HTN Stable  3. Obesity Encouraged weight loss  If pt is not a candidate for ablation, consideration for Dr. Olin Pia method of double defibrillator with pressure on chest pads, style cardioversion, for thick chest walls   Butch Penny C. , [facility] [address] [place], [place] [address] [phone]

## 2018-06-23 DIAGNOSIS — N5089 Other specified disorders of the male genital organs: Secondary | ICD-10-CM | POA: Diagnosis not present

## 2018-06-23 DIAGNOSIS — B078 Other viral warts: Secondary | ICD-10-CM | POA: Diagnosis not present

## 2018-06-24 ENCOUNTER — Ambulatory Visit (HOSPITAL_COMMUNITY): Payer: BLUE CROSS/BLUE SHIELD

## 2018-06-28 ENCOUNTER — Other Ambulatory Visit (HOSPITAL_COMMUNITY): Payer: Self-pay | Admitting: Nurse Practitioner

## 2018-07-17 ENCOUNTER — Telehealth: Payer: Self-pay

## 2018-07-17 NOTE — Telephone Encounter (Signed)
Spoke with pt regarding appt on 07/18/18. Pt stated he can not upload EKG, but will check vitals day of appt. Pt questions and concerns were address.

## 2018-07-18 ENCOUNTER — Telehealth (INDEPENDENT_AMBULATORY_CARE_PROVIDER_SITE_OTHER): Payer: BLUE CROSS/BLUE SHIELD | Admitting: Internal Medicine

## 2018-07-18 ENCOUNTER — Encounter: Payer: Self-pay | Admitting: Internal Medicine

## 2018-07-18 VITALS — BP 140/80 | HR 78 | Wt 285.0 lb

## 2018-07-18 DIAGNOSIS — I10 Essential (primary) hypertension: Secondary | ICD-10-CM

## 2018-07-18 DIAGNOSIS — I4819 Other persistent atrial fibrillation: Secondary | ICD-10-CM

## 2018-07-18 DIAGNOSIS — G4733 Obstructive sleep apnea (adult) (pediatric): Secondary | ICD-10-CM | POA: Diagnosis not present

## 2018-07-18 NOTE — Progress Notes (Signed)
Electrophysiology TeleHealth Note   Due to national recommendations of social distancing due to [place] 19, Audio/video telehealth visit is felt to be most appropriate for this patient at this time.  See MyChart message from today for patient consent regarding telehealth for [facility].   Date:  07/18/2018   ID:  Sean Morales, DOB [birth year], MRN [id number]  Location: home Provider location:   [facility] Evaluation Performed: New patient consult  PCP:  Sharilyn Sites, MD   Electrophysiologist:  Dr Rayann Heman  Chief Complaint:  afib  History of Present Illness:    Sean Morales is a 58 y.o. male who presents via audio/video conferencing for a telehealth visit today.   I saw him last in 2017.  He maintained sinus rhythm since that time.  Unfortunately, he returned to afib 05/2018.  He reports symptoms of palpitations, fatigue, and SOB.  His exercise tolerance has been reduced.  He underwent cardioversion 06/03/2018.  He returned to afib about 10 minutes.  He continues to take tikosyn. He has lost weight down to 240 bpm but has recently been less active and has returned to 290 lbs. Today, he denies symptoms of palpitations, chest pain, shortness of breath, orthopnea, PND, claudication, dizziness, presyncope, syncope, bleeding, or neurologic sequela. The patient is tolerating medications without difficulties and is otherwise without complaint today.  + edema   he denies symptoms of cough, fevers, chills, or new SOB worrisome for COVID 19.   Past Medical History:  Diagnosis Date  . Bilateral knee pain   . Depression    no current meds  . DJD (degenerative joint disease)   . First degree heart block   . GERD (gastroesophageal reflux disease)   . Hypertension   . OA (osteoarthritis) of knee    bilateral  . Obstructive sleep apnea   . Persistent atrial fibrillation first dx 10/ 2015-- previous primary cardiologist, [name]   followed by atrial fib. clinic-- [name]  NP    Past Surgical History:  Procedure Laterality Date  . APPENDECTOMY  age 38  . CARDIOVASCULAR STRESS TEST  01-17-2015  [name]   Low risk nuclear study w/ no ischemia/  normal LV function and wal motion,  nuclear stress ef 60%  . CARDIOVERSION N/A 01/20/2015   Procedure: CARDIOVERSION;  Surgeon: [name], MD;  Location: [facility];  Service: Cardiovascular;  Laterality: N/A;  . CARDIOVERSION N/A 06/03/2018   Procedure: CARDIOVERSION;  Surgeon: [name], MD;  Location: [facility] ENDOSCOPY;  Service: Cardiovascular;  Laterality: N/A;  . COLONOSCOPY N/A 09/20/2017   Procedure: COLONOSCOPY;  Surgeon: [name], MD;  Location: [facility];  Service: Endoscopy;  Laterality: N/A;  1:30pm  . colonscopy  age 58  . cysts removed from nipples  in high school   and neck  . ORIF RIGHT KNEE  age 37  . POLYPECTOMY  09/20/2017   Procedure: POLYPECTOMY;  Surgeon: [name], MD;  [facility]: [facility];  Service: Endoscopy;;  . TONSILLECTOMY  age 66  . TOTAL KNEE ARTHROPLASTY Bilateral 05/20/2017   Procedure: TOTAL KNEE BILATERAL;  Surgeon: [name], MD;  Location: [facility];  Service: Orthopedics;  Laterality: Bilateral;  Bilateral Adductor Block  . TRANSTHORACIC ECHOCARDIOGRAM  03/29/2017   ef 60-65%/  trivial AR, MR, and TR/  mild LAE/ moderate RAE/  PASP 69mmHg    Current Outpatient Medications  Medication Sig Dispense Refill  . acetaminophen (TYLENOL) 500 MG tablet Take 1,000 mg  by mouth every 6 (six) hours as needed for moderate pain.    Marland Kitchen amLODipine (NORVASC) 10 MG tablet Take 1 tablet (10 mg total) by mouth every evening.    . betamethasone dipropionate (DIPROLENE) 0.05 % cream Apply 1 application topically 2 (two) times daily as needed (irritation).   3  . dofetilide (TIKOSYN) 500 MCG capsule TAKE ONE CAPSULE BY MOUTH TWICE A DAY 60 capsule 5  . ibuprofen (ADVIL,MOTRIN) 200 MG tablet Take 400-800 mg by mouth every 6 (six) hours as needed for headache or moderate  pain.    Marland Kitchen losartan (COZAAR) 100 MG tablet Take 100 mg by mouth daily.     . Magnesium 250 MG TABS Take 250 mg by mouth daily.     . metoprolol tartrate (LOPRESSOR) 25 MG tablet Take 1 tablet (25 mg total) by mouth 2 (two) times daily.    Marland Kitchen omeprazole (PRILOSEC) 40 MG capsule Take 40 mg by mouth daily.     . rivaroxaban (XARELTO) 20 MG TABS tablet Take 1 tablet (20 mg total) by mouth daily with supper.     No current facility-administered medications for this visit.     Allergies:   Bee venom; Other; Penicillins; Sulfa antibiotics; and Tetracyclines & related   Social History:  The patient  reports that he has been smoking cigarettes. He started smoking about 41 years ago. He has a 20.00 pack-year smoking history. He has never used smokeless tobacco. He reports current alcohol use. He reports that he does not use drugs.   Family History:  The patient's  family history includes Deep vein thrombosis in his father; Stroke in his mother.    ROS:  Please see the history of present illness.   All other systems are personally reviewed and negative.    Exam:    Vital Signs:  BP 140/80   Pulse 78   Wt 285 lb (129.3 kg)   BMI 38.65 kg/m    Well appearing, alert and conversant, regular work of breathing,  good skin color Eyes- anicteric, neuro- grossly intact, skin- no apparent rash or lesions or cyanosis, mouth- oral mucosa is pink   Labs/Other Tests and Data Reviewed:    Recent Labs: 05/22/2018: BUN 10; Creatinine, Ser 1.00; Magnesium 2.0; Potassium 4.0; Sodium 140 05/27/2018: Hemoglobin 15.8; Platelets 258   Wt Readings from Last 3 Encounters:  07/18/18 285 lb (129.3 kg)  06/03/18 290 lb (131.5 kg)  05/22/18 (!) 302 lb (137 kg)     Other studies personally reviewed: Additional studies/ records that were reviewed today include: AF clinic notes, my prior note, ekgs and prior echo  Review of the above records today demonstrates: as above Prior radiographs: N/A    ASSESSMENT &  PLAN:    1.  Persistent atrial fibrillation The patient has symptomatic, recurrent persistent atrial fibrillation. he has failed medical therapy with tikosyn.  he is anticoagulated with xarleto . Therapeutic strategies for afib including medicine (amiodarone) and ablation were discussed in detail with the patient today. Risk, benefits, and alternatives to EP study and radiofrequency ablation for afib were also discussed in detail today. These risks include but are not limited to stroke, bleeding, vascular damage, tamponade, perforation, damage to the esophagus, lungs, and other structures, pulmonary vein stenosis, worsening renal function, and death. The patient understands these risk and wishes to proceed.  We will therefore proceed with catheter ablation at the next available time.  Carto, ICE, anesthesia are requested for the procedure.  Will also obtain cardiac  CT prior to the procedure to exclude LAA thrombus and further evaluate atrial anatomy. Continue tikosyn in the interim 2d echo is also ordered prior to ablation, once COVID 19 has passed  2. Morbid obesity Lifestyle modification is encouraged  3. OSA Compliance with CPAP is encouraged  4. HTN Stable No change required today  5. COVID screen The patient does not have any symptoms that suggest any further testing/ screening at this time.  Social distancing reinforced today.   Current medicines are reviewed at length with the patient today.   The patient does not have concerns regarding his medicines.  The following changes were made today:  none  Labs/ tests ordered today include:  No orders of the defined types were placed in this encounter.   Patient Risk:  after full review of this patients clinical status, I feel that they are at moderate risk at this time.   Today, I have spent 25 minutes with the patient with telehealth technology discussing afib management .    Signed, Thompson Grayer MD, [facility] 07/18/2018 3:26 PM    [place] [address] [place] [address] [phone] (office) [phone] (fax)

## 2018-07-31 ENCOUNTER — Other Ambulatory Visit: Payer: Self-pay | Admitting: Pharmacist

## 2018-07-31 MED ORDER — RIVAROXABAN 20 MG PO TABS: 20.0000 mg | ORAL_TABLET | Freq: Every day | ORAL | 1 refills | Status: DC

## 2018-07-31 NOTE — Telephone Encounter (Signed)
58yo Male Scr = 1.0 on 05/22/2018 Video-visit Allred on 07/18/2018

## 2018-08-05 ENCOUNTER — Other Ambulatory Visit: Payer: Self-pay

## 2018-08-05 ENCOUNTER — Ambulatory Visit (HOSPITAL_COMMUNITY)
Admission: RE | Admit: 2018-08-05 | Discharge: 2018-08-05 | Disposition: A | Discharge status: Home / Self Care | Payer: BLUE CROSS/BLUE SHIELD | Source: Ambulatory Visit | Attending: Nurse Practitioner | Admitting: Nurse Practitioner

## 2018-08-05 DIAGNOSIS — I10 Essential (primary) hypertension: Secondary | ICD-10-CM | POA: Insufficient documentation

## 2018-08-05 DIAGNOSIS — E669 Obesity, unspecified: Secondary | ICD-10-CM | POA: Insufficient documentation

## 2018-08-05 DIAGNOSIS — I4819 Other persistent atrial fibrillation: Secondary | ICD-10-CM | POA: Insufficient documentation

## 2018-08-05 DIAGNOSIS — I351 Nonrheumatic aortic (valve) insufficiency: Secondary | ICD-10-CM | POA: Diagnosis not present

## 2018-08-05 DIAGNOSIS — F17210 Nicotine dependence, cigarettes, uncomplicated: Secondary | ICD-10-CM | POA: Diagnosis not present

## 2018-08-05 NOTE — Progress Notes (Signed)
  Echocardiogram 2D Echocardiogram has been performed.  Sean Morales L Androw 08/05/2018, 2:19 PM

## 2018-08-07 ENCOUNTER — Encounter (HOSPITAL_COMMUNITY): Payer: Self-pay | Admitting: *Deleted

## 2018-08-14 ENCOUNTER — Other Ambulatory Visit (HOSPITAL_COMMUNITY): Payer: Self-pay | Admitting: *Deleted

## 2018-08-14 MED ORDER — LOSARTAN POTASSIUM 100 MG PO TABS: 100.0000 mg | ORAL_TABLET | Freq: Every day | ORAL | 6 refills | Status: DC

## 2018-09-08 ENCOUNTER — Other Ambulatory Visit: Payer: Self-pay | Admitting: Nurse Practitioner

## 2018-09-10 ENCOUNTER — Other Ambulatory Visit (HOSPITAL_COMMUNITY): Payer: Self-pay | Admitting: *Deleted

## 2018-09-10 MED ORDER — METOPROLOL TARTRATE 25 MG PO TABS: 25.0000 mg | ORAL_TABLET | Freq: Two times a day (BID) | ORAL | 6 refills | Status: DC

## 2018-09-15 ENCOUNTER — Telehealth: Payer: Self-pay

## 2018-09-15 NOTE — Telephone Encounter (Signed)
Left message requesting call back to discuss scheduling afib ablation.

## 2018-11-26 ENCOUNTER — Telehealth: Payer: Self-pay | Admitting: Internal Medicine

## 2018-11-26 DIAGNOSIS — I4819 Other persistent atrial fibrillation: Secondary | ICD-10-CM

## 2018-11-26 NOTE — Telephone Encounter (Signed)
  Patient wants to speak to the nurse regarding scheduling the ablation that Dr Rayann Heman wanted to do

## 2018-11-27 NOTE — Telephone Encounter (Signed)
Call returned to Pt.  Pt scheduled for September 8,2020 at 10:30 am  Orders entered.  Needs scheduled for cardiac CT, labs and covid test  Need to create instruction letter

## 2018-12-02 ENCOUNTER — Other Ambulatory Visit: Payer: Self-pay

## 2018-12-02 NOTE — Telephone Encounter (Signed)
Work up complete. 

## 2018-12-03 ENCOUNTER — Telehealth (HOSPITAL_COMMUNITY): Payer: Self-pay | Admitting: Emergency Medicine

## 2018-12-03 NOTE — Telephone Encounter (Signed)
Reaching out to patient to offer assistance regarding upcoming cardiac imaging study; pt verbalizes understanding of appt date/time, parking situation and where to check in, pre-test NPO status and medications ordered, and verified current allergies; name and call back number provided for further questions should they arise Aasiyah Auerbach RN Navigator Cardiac Imaging [facility] [phone] office [phone] cell 

## 2018-12-04 ENCOUNTER — Other Ambulatory Visit: Payer: Self-pay

## 2018-12-04 ENCOUNTER — Ambulatory Visit (HOSPITAL_COMMUNITY)
Admission: RE | Admit: 2018-12-04 | Discharge: 2018-12-04 | Disposition: A | Discharge status: Home / Self Care | Payer: BC Managed Care – PPO | Source: Ambulatory Visit | Attending: Internal Medicine | Admitting: Internal Medicine

## 2018-12-04 ENCOUNTER — Ambulatory Visit (HOSPITAL_COMMUNITY): Admission: RE | Admit: 2018-12-04 | Payer: BC Managed Care – PPO | Source: Ambulatory Visit

## 2018-12-04 DIAGNOSIS — I4819 Other persistent atrial fibrillation: Secondary | ICD-10-CM | POA: Insufficient documentation

## 2018-12-04 MED ORDER — IOHEXOL 350 MG/ML SOLN
80.0000 mL | Freq: Once | INTRAVENOUS | Status: AC | PRN
  Administered 2018-12-04: 16:00:00 80 mL via INTRAVENOUS

## 2018-12-05 ENCOUNTER — Other Ambulatory Visit (HOSPITAL_COMMUNITY)
Admission: RE | Admit: 2018-12-05 | Discharge: 2018-12-05 | Disposition: A | Discharge status: Home / Self Care | Payer: BC Managed Care – PPO | Source: Ambulatory Visit | Attending: Internal Medicine | Admitting: Internal Medicine

## 2018-12-05 ENCOUNTER — Other Ambulatory Visit: Payer: BC Managed Care – PPO | Admitting: *Deleted

## 2018-12-05 DIAGNOSIS — I4819 Other persistent atrial fibrillation: Secondary | ICD-10-CM

## 2018-12-05 DIAGNOSIS — Z20828 Contact with and (suspected) exposure to other viral communicable diseases: Secondary | ICD-10-CM | POA: Insufficient documentation

## 2018-12-05 DIAGNOSIS — Z01812 Encounter for preprocedural laboratory examination: Secondary | ICD-10-CM | POA: Diagnosis not present

## 2018-12-05 LAB — CBC WITH DIFFERENTIAL/PLATELET
Basophils Absolute: 0.1 10*3/uL (ref 0.0–0.2)
Basos: 1 %
EOS (ABSOLUTE): 0.4 10*3/uL (ref 0.0–0.4)
Eos: 4 %
Hematocrit: 49.5 % (ref 37.5–51.0)
Hemoglobin: 16.5 g/dL (ref 13.0–17.7)
Immature Grans (Abs): 0 10*3/uL (ref 0.0–0.1)
Immature Granulocytes: 0 %
Lymphocytes Absolute: 2.1 10*3/uL (ref 0.7–3.1)
Lymphs: 22 %
MCH: 30.3 pg (ref 26.6–33.0)
MCHC: 33.3 g/dL (ref 31.5–35.7)
MCV: 91 fL (ref 79–97)
Monocytes Absolute: 0.9 10*3/uL (ref 0.1–0.9)
Monocytes: 10 %
Neutrophils Absolute: 5.9 10*3/uL (ref 1.4–7.0)
Neutrophils: 63 %
Platelets: 250 10*3/uL (ref 150–450)
RBC: 5.44 x10E6/uL (ref 4.14–5.80)
RDW: 12.8 % (ref 11.6–15.4)
WBC: 9.4 10*3/uL (ref 3.4–10.8)

## 2018-12-05 LAB — BASIC METABOLIC PANEL
BUN/Creatinine Ratio: 14 (ref 9–20)
BUN: 15 mg/dL (ref 6–24)
CO2: 22 mmol/L (ref 20–29)
Calcium: 9.2 mg/dL (ref 8.7–10.2)
Chloride: 103 mmol/L (ref 96–106)
Creatinine, Ser: 1.11 mg/dL (ref 0.76–1.27)
GFR calc Af Amer: 85 mL/min/{1.73_m2} (ref >59)
GFR calc non Af Amer: 73 mL/min/{1.73_m2} (ref >59)
Glucose: 90 mg/dL (ref 65–99)
Potassium: 5.2 mmol/L (ref 3.5–5.2)
Sodium: 143 mmol/L (ref 134–144)

## 2018-12-06 LAB — NOVEL CORONAVIRUS, NAA (HOSP ORDER, SEND-OUT TO REF LAB; TAT 18-24 HRS): SARS-CoV-2, NAA: NOT DETECTED

## 2018-12-09 ENCOUNTER — Other Ambulatory Visit: Payer: Self-pay

## 2018-12-09 ENCOUNTER — Ambulatory Visit (HOSPITAL_COMMUNITY): Payer: BC Managed Care – PPO | Admitting: Certified Registered Nurse Anesthetist

## 2018-12-09 ENCOUNTER — Ambulatory Visit (HOSPITAL_COMMUNITY)
Admission: RE | Admit: 2018-12-09 | Discharge: 2018-12-09 | Disposition: A | Discharge status: Home / Self Care | Payer: BC Managed Care – PPO | Attending: Internal Medicine | Admitting: Internal Medicine

## 2018-12-09 ENCOUNTER — Encounter (HOSPITAL_COMMUNITY)
Admission: RE | Disposition: A | Discharge status: Home / Self Care | Payer: Self-pay | Source: Home / Self Care | Attending: Internal Medicine

## 2018-12-09 DIAGNOSIS — I4819 Other persistent atrial fibrillation: Secondary | ICD-10-CM | POA: Insufficient documentation

## 2018-12-09 DIAGNOSIS — Z880 Allergy status to penicillin: Secondary | ICD-10-CM | POA: Diagnosis not present

## 2018-12-09 DIAGNOSIS — M170 Bilateral primary osteoarthritis of knee: Secondary | ICD-10-CM | POA: Diagnosis not present

## 2018-12-09 DIAGNOSIS — Z791 Long term (current) use of non-steroidal anti-inflammatories (NSAID): Secondary | ICD-10-CM | POA: Insufficient documentation

## 2018-12-09 DIAGNOSIS — I10 Essential (primary) hypertension: Secondary | ICD-10-CM | POA: Diagnosis not present

## 2018-12-09 DIAGNOSIS — F329 Major depressive disorder, single episode, unspecified: Secondary | ICD-10-CM | POA: Diagnosis not present

## 2018-12-09 DIAGNOSIS — Z79899 Other long term (current) drug therapy: Secondary | ICD-10-CM | POA: Insufficient documentation

## 2018-12-09 DIAGNOSIS — Z7901 Long term (current) use of anticoagulants: Secondary | ICD-10-CM | POA: Diagnosis not present

## 2018-12-09 DIAGNOSIS — G4733 Obstructive sleep apnea (adult) (pediatric): Secondary | ICD-10-CM | POA: Diagnosis not present

## 2018-12-09 DIAGNOSIS — F17210 Nicotine dependence, cigarettes, uncomplicated: Secondary | ICD-10-CM | POA: Insufficient documentation

## 2018-12-09 DIAGNOSIS — K219 Gastro-esophageal reflux disease without esophagitis: Secondary | ICD-10-CM | POA: Diagnosis not present

## 2018-12-09 DIAGNOSIS — I440 Atrioventricular block, first degree: Secondary | ICD-10-CM | POA: Insufficient documentation

## 2018-12-09 DIAGNOSIS — Z882 Allergy status to sulfonamides status: Secondary | ICD-10-CM | POA: Diagnosis not present

## 2018-12-09 DIAGNOSIS — I441 Atrioventricular block, second degree: Secondary | ICD-10-CM | POA: Diagnosis not present

## 2018-12-09 DIAGNOSIS — I4891 Unspecified atrial fibrillation: Secondary | ICD-10-CM | POA: Diagnosis not present

## 2018-12-09 DIAGNOSIS — I483 Typical atrial flutter: Secondary | ICD-10-CM | POA: Diagnosis not present

## 2018-12-09 DIAGNOSIS — I4892 Unspecified atrial flutter: Secondary | ICD-10-CM | POA: Diagnosis not present

## 2018-12-09 HISTORY — PX: ATRIAL FIBRILLATION ABLATION: EP1191

## 2018-12-09 LAB — POCT ACTIVATED CLOTTING TIME
Activated Clotting Time: 257 seconds
Activated Clotting Time: 329 seconds

## 2018-12-09 SURGERY — ATRIAL FIBRILLATION ABLATION
Anesthesia: General

## 2018-12-09 MED ORDER — DEXAMETHASONE SODIUM PHOSPHATE 10 MG/ML IJ SOLN
INTRAMUSCULAR | Status: DC | PRN
  Administered 2018-12-09: 5 mg via INTRAVENOUS

## 2018-12-09 MED ORDER — HEPARIN SODIUM (PORCINE) 1000 UNIT/ML IJ SOLN
INTRAMUSCULAR | Status: DC | PRN
  Administered 2018-12-09: 1000 [IU] via INTRAVENOUS
  Administered 2018-12-09: 12000 [IU] via INTRAVENOUS

## 2018-12-09 MED ORDER — ONDANSETRON HCL 4 MG/2ML IJ SOLN: 4.0000 mg | Freq: Four times a day (QID) | INTRAMUSCULAR | Status: DC | PRN

## 2018-12-09 MED ORDER — HEPARIN (PORCINE) IN NACL 1000-0.9 UT/500ML-% IV SOLN
INTRAVENOUS | Status: DC | PRN
  Administered 2018-12-09: 500 mL

## 2018-12-09 MED ORDER — SODIUM CHLORIDE 0.9% FLUSH: 3.0000 mL | Freq: Two times a day (BID) | INTRAVENOUS | Status: DC

## 2018-12-09 MED ORDER — HEPARIN SODIUM (PORCINE) 1000 UNIT/ML IJ SOLN
INTRAMUSCULAR | Status: AC
  Filled 2018-12-09: qty 2

## 2018-12-09 MED ORDER — FENTANYL CITRATE (PF) 250 MCG/5ML IJ SOLN
INTRAMUSCULAR | Status: DC | PRN
  Administered 2018-12-09: 50 ug via INTRAVENOUS
  Administered 2018-12-09 (×2): 25 ug via INTRAVENOUS

## 2018-12-09 MED ORDER — ONDANSETRON HCL 4 MG/2ML IJ SOLN
INTRAMUSCULAR | Status: DC | PRN
  Administered 2018-12-09: 4 mg via INTRAVENOUS

## 2018-12-09 MED ORDER — ALBUTEROL SULFATE HFA 108 (90 BASE) MCG/ACT IN AERS
INHALATION_SPRAY | RESPIRATORY_TRACT | Status: DC | PRN
  Administered 2018-12-09: 2 via RESPIRATORY_TRACT

## 2018-12-09 MED ORDER — ROCURONIUM BROMIDE 50 MG/5ML IV SOSY
PREFILLED_SYRINGE | INTRAVENOUS | Status: DC | PRN
  Administered 2018-12-09: 20 mg via INTRAVENOUS
  Administered 2018-12-09: 50 mg via INTRAVENOUS
  Administered 2018-12-09: 10 mg via INTRAVENOUS

## 2018-12-09 MED ORDER — SODIUM CHLORIDE 0.9 % IV SOLN: 250.0000 mL | INTRAVENOUS | Status: DC | PRN

## 2018-12-09 MED ORDER — SODIUM CHLORIDE 0.9 % IV SOLN
INTRAVENOUS | Status: DC
  Administered 2018-12-09: 10:00:00 via INTRAVENOUS

## 2018-12-09 MED ORDER — BUPIVACAINE HCL (PF) 0.25 % IJ SOLN
INTRAMUSCULAR | Status: DC | PRN
  Administered 2018-12-09: 20 mL

## 2018-12-09 MED ORDER — ACETAMINOPHEN 325 MG PO TABS
650.0000 mg | ORAL_TABLET | ORAL | Status: DC | PRN
  Filled 2018-12-09: qty 2

## 2018-12-09 MED ORDER — HEPARIN SODIUM (PORCINE) 1000 UNIT/ML IJ SOLN
INTRAMUSCULAR | Status: DC | PRN
  Administered 2018-12-09: 4000 [IU] via INTRAVENOUS

## 2018-12-09 MED ORDER — PHENYLEPHRINE HCL (PRESSORS) 10 MG/ML IV SOLN
INTRAVENOUS | Status: DC | PRN
  Administered 2018-12-09: 80 ug via INTRAVENOUS

## 2018-12-09 MED ORDER — MIDAZOLAM HCL 2 MG/2ML IJ SOLN
INTRAMUSCULAR | Status: DC | PRN
  Administered 2018-12-09: 2 mg via INTRAVENOUS

## 2018-12-09 MED ORDER — PROPOFOL 10 MG/ML IV BOLUS
INTRAVENOUS | Status: DC | PRN
  Administered 2018-12-09: 20 mg via INTRAVENOUS
  Administered 2018-12-09: 160 mg via INTRAVENOUS

## 2018-12-09 MED ORDER — SUGAMMADEX SODIUM 200 MG/2ML IV SOLN
INTRAVENOUS | Status: DC | PRN
  Administered 2018-12-09: 263 mg via INTRAVENOUS

## 2018-12-09 MED ORDER — PROTAMINE SULFATE 10 MG/ML IV SOLN
INTRAVENOUS | Status: DC | PRN
  Administered 2018-12-09: 40 mg via INTRAVENOUS

## 2018-12-09 MED ORDER — SUCCINYLCHOLINE CHLORIDE 200 MG/10ML IV SOSY
PREFILLED_SYRINGE | INTRAVENOUS | Status: DC | PRN
  Administered 2018-12-09: 120 mg via INTRAVENOUS

## 2018-12-09 MED ORDER — HEPARIN (PORCINE) IN NACL 1000-0.9 UT/500ML-% IV SOLN
INTRAVENOUS | Status: AC
  Filled 2018-12-09: qty 500

## 2018-12-09 MED ORDER — HYDROCODONE-ACETAMINOPHEN 5-325 MG PO TABS: 1.0000 | ORAL_TABLET | ORAL | Status: DC | PRN

## 2018-12-09 MED ORDER — SODIUM CHLORIDE 0.9% FLUSH: 3.0000 mL | INTRAVENOUS | Status: DC | PRN

## 2018-12-09 MED ORDER — BUPIVACAINE HCL (PF) 0.25 % IJ SOLN
INTRAMUSCULAR | Status: AC
  Filled 2018-12-09: qty 30

## 2018-12-09 SURGICAL SUPPLY — 17 items
BLANKET WARM UNDERBOD FULL ACC (MISCELLANEOUS) ×3 IMPLANT
CATH MAPPNG PENTARAY F 2-6-2MM (CATHETERS) IMPLANT
CATH SMTCH THERMOCOOL SF DF (CATHETERS) ×2 IMPLANT
CATH SOUNDSTAR 3D IMAGING (CATHETERS) ×2 IMPLANT
CATH WEBSTER BI DIR CS D-F CRV (CATHETERS) ×2 IMPLANT
COVER SWIFTLINK CONNECTOR (BAG) ×3 IMPLANT
NDL BAYLIS TRANSSEPTAL 71CM (NEEDLE) IMPLANT
NEEDLE BAYLIS TRANSSEPTAL 71CM (NEEDLE) ×3 IMPLANT
PACK EP LATEX FREE (CUSTOM PROCEDURE TRAY) ×3
PACK EP LF (CUSTOM PROCEDURE TRAY) ×1 IMPLANT
PAD PRO RADIOLUCENT 2001M-C (PAD) ×3 IMPLANT
PATCH CARTO3 (PAD) ×2 IMPLANT
PENTARAY F 2-6-2MM (CATHETERS) ×3
SHEATH AVANTI 11F 11CM (SHEATH) ×2 IMPLANT
SHEATH PINNACLE 7F 10CM (SHEATH) ×4 IMPLANT
SHEATH SWARTZ TS SL2 63CM 8.5F (SHEATH) ×2 IMPLANT
TUBING SMART ABLATE COOLFLOW (TUBING) ×6 IMPLANT

## 2018-12-09 NOTE — Progress Notes (Signed)
Site area: rt groin 3 way stop cock and suture removed Site Prior to Removal:  Level 0, raised and puffy, soft Pressure Applied For: 10 minutes Manual:   yes Patient Status During Pull:  stable Post Pull Site:  Level  0, remains raised and puffy, soft Post Pull Instructions Given:  yes Post Pull Pulses Present: rt dp palpable Dressing Applied:  Gauze and tegaderm Bedrest begins @ [id number] Comments:  IV saline locked

## 2018-12-09 NOTE — Discharge Instructions (Signed)

## 2018-12-09 NOTE — H&P (Signed)
Chief Complaint:  afib  History of Present Illness:    Sean Morales is a 58 y.o. male who presents today for afib ablation.     Unfortunately, he remains in afib.  He reports symptoms of palpitations, fatigue, and SOB.  His exercise tolerance has been reduced.  He underwent cardioversion 06/03/2018.  He returned to afib about 10 minutes.  He continues to take tikosyn. He has lost weight down to 240 bpm but has recently been less active and has returned to 290 lbs. Today, he denies symptoms of palpitations, chest pain, shortness of breath, orthopnea, PND, claudication, dizziness, presyncope, syncope, bleeding, or neurologic sequela. The patient is tolerating medications without difficulties and is otherwise without complaint today.  + edema   he denies symptoms of cough, fevers, chills, or new SOB worrisome for COVID 19.       Past Medical History:  Diagnosis Date  . Bilateral knee pain   . Depression    no current meds  . DJD (degenerative joint disease)   . First degree heart block   . GERD (gastroesophageal reflux disease)   . Hypertension   . OA (osteoarthritis) of knee    bilateral  . Obstructive sleep apnea   . Persistent atrial fibrillation first dx 10/ 2015-- previous primary cardiologist, [name]   followed by atrial fib. clinic-- [name] NP         Past Surgical History:  Procedure Laterality Date  . APPENDECTOMY  age 67  . CARDIOVASCULAR STRESS TEST  01-17-2015  [name]   Low risk nuclear study w/ no ischemia/  normal LV function and wal motion,  nuclear stress ef 60%  . CARDIOVERSION N/A 01/20/2015   Procedure: CARDIOVERSION;  Surgeon: [name], MD;  Location: [facility];  Service: Cardiovascular;  Laterality: N/A;  . CARDIOVERSION N/A 06/03/2018   Procedure: CARDIOVERSION;  Surgeon: [name], MD;  Location: [facility];  Service: Cardiovascular;  Laterality: N/A;  . COLONOSCOPY N/A 09/20/2017   Procedure:  COLONOSCOPY;  Surgeon: [name], MD;  [facility]: [facility];  Service: Endoscopy;  Laterality: N/A;  1:30pm  . colonscopy  age 60  . cysts removed from nipples  in high school   and neck  . ORIF RIGHT KNEE  age 60  . POLYPECTOMY  09/20/2017   Procedure: POLYPECTOMY;  Surgeon: [name], MD;  [facility]: [facility];  Service: Endoscopy;;  . TONSILLECTOMY  age 46  . TOTAL KNEE ARTHROPLASTY Bilateral 05/20/2017   Procedure: TOTAL KNEE BILATERAL;  Surgeon: [name], MD;  Location: [facility];  Service: Orthopedics;  Laterality: Bilateral;  Bilateral Adductor Block  . TRANSTHORACIC ECHOCARDIOGRAM  03/29/2017   ef 60-65%/  trivial AR, MR, and TR/  mild LAE/ moderate RAE/  PASP 42mmHg          Current Outpatient Medications  Medication Sig Dispense Refill  . acetaminophen (TYLENOL) 500 MG tablet Take 1,000 mg by mouth every 6 (six) hours as needed for moderate pain.    Marland Kitchen amLODipine (NORVASC) 10 MG tablet Take 1 tablet (10 mg total) by mouth every evening.    . betamethasone dipropionate (DIPROLENE) 0.05 % cream Apply 1 application topically 2 (two) times daily as needed (irritation).   3  . dofetilide (TIKOSYN) 500 MCG capsule TAKE ONE CAPSULE BY MOUTH TWICE A DAY 60 capsule 5  . ibuprofen (ADVIL,MOTRIN) 200 MG tablet Take 400-800 mg by mouth every 6 (six) hours as needed for headache or moderate  pain.    . losartan (COZAAR) 100 MG tablet Take 100 mg by mouth daily.     . Magnesium 250 MG TABS Take 250 mg by mouth daily.     . metoprolol tartrate (LOPRESSOR) 25 MG tablet Take 1 tablet (25 mg total) by mouth 2 (two) times daily.    Marland Kitchen omeprazole (PRILOSEC) 40 MG capsule Take 40 mg by mouth daily.     . rivaroxaban (XARELTO) 20 MG TABS tablet Take 1 tablet (20 mg total) by mouth daily with supper.     No current facility-administered medications for this visit.     Allergies:   Bee venom; Other; Penicillins; Sulfa antibiotics; and Tetracyclines &  related   Social History:  The patient  reports that he has been smoking cigarettes. He started smoking about 41 years ago. He has a 20.00 pack-year smoking history. He has never used smokeless tobacco. He reports current alcohol use. He reports that he does not use drugs.   Family History:  The patient's  family history includes Deep vein thrombosis in his father; Stroke in his mother.    ROS:  Please see the history of present illness.   All other systems are personally reviewed and negative.   Physical Exam: Vitals:   12/09/18 0842  BP: (!) 169/118  Pulse: 73  Resp: 18  Temp: 98.1 F (36.7 C)  TempSrc: Skin  SpO2: 100%  Weight: 131.5 kg  Height: 6' (1.829 m)    GEN- The patient is well appearing, alert and oriented x 3 today.   Head- normocephalic, atraumatic Eyes-  Sclera clear, conjunctiva pink Ears- hearing intact Oropharynx- clear Neck- supple, Lungs- normal work of breathing Heart- irregular rate and rhythm  GI- soft, NT, ND, + BS Extremities- no clubbing, cyanosis, or edema, groin is without hematoma/ bruit MS- no significant deformity or atrophy Skin- no rash or lesion Psych- euthymic mood, full affect Neuro- strength and sensation are intact   Labs/Other Tests and Data Reviewed:    Recent Labs: 05/22/2018: BUN 10; Creatinine, Ser 1.00; Magnesium 2.0; Potassium 4.0; Sodium 140 05/27/2018: Hemoglobin 15.8; Platelets 258      Wt Readings from Last 3 Encounters:  07/18/18 285 lb (129.3 kg)  06/03/18 290 lb (131.5 kg)  05/22/18 (!) 302 lb (137 kg)     Other studies personally reviewed: Additional studies/ records that were reviewed today include: AF clinic notes, my prior note, ekgs and prior echo  Review of the above records today demonstrates: as above Prior radiographs: N/A    ASSESSMENT & PLAN:    1.  Persistent atrial fibrillation The patient has symptomatic, recurrent persistent atrial fibrillation. he has failed medical therapy with  tikosyn.  he is anticoagulated with xarleto . Therapeutic strategies for afib including medicine (amiodarone) and ablation were discussed in detail with the patient today. Risk, benefits, and alternatives to EP study and radiofrequency ablation for afib were also discussed in detail today. These risks include but are not limited to stroke, bleeding, vascular damage, tamponade, perforation, damage to the esophagus, lungs, and other structures, pulmonary vein stenosis, worsening renal function, and death. The patient understands these risk and wishes to proceed at this time.  He reports compliance with xarelto without interruption.  Thompson Grayer MD, [facility] 12/09/2018 10:00 AM

## 2018-12-09 NOTE — Anesthesia Procedure Notes (Signed)
Procedure Name: Intubation Date/Time: 12/09/2018 10:37 AM Performed by: Kathryne Hitch, CRNA Pre-anesthesia Checklist: Patient identified, Emergency Drugs available, Suction available and Patient being monitored Patient Re-evaluated:Patient Re-evaluated prior to induction Oxygen Delivery Method: Circle system utilized Preoxygenation: Pre-oxygenation with 100% oxygen Induction Type: IV induction Laryngoscope Size: McGraph and 4 (Miller 2, DVL x1 + Grade 3 View) Grade View: Grade I Tube type: Oral Tube size: 7.5 mm Number of attempts: 2 Airway Equipment and Method: Stylet and Oral airway Placement Confirmation: ETT inserted through vocal cords under direct vision,  positive ETCO2 and breath sounds checked- equal and bilateral Secured at: 23 cm Tube secured with: Tape Dental Injury: Teeth and Oropharynx as per pre-operative assessment  Difficulty Due To: Difficulty was anticipated, Difficult Airway- due to anterior larynx and Difficult Airway- due to large tongue

## 2018-12-09 NOTE — Anesthesia Preprocedure Evaluation (Signed)
Anesthesia Evaluation  Patient identified by MRN, date of birth, ID band Patient awake    Reviewed: Allergy & Precautions, NPO status , Patient's Chart, lab work & pertinent test results  Airway Mallampati: II  TM Distance: >3 FB     Dental   Pulmonary Current Smoker,    breath sounds clear to auscultation       Cardiovascular hypertension, + dysrhythmias  Rhythm:Irregular Rate:Normal     Neuro/Psych    GI/Hepatic Neg liver ROS, GERD  ,  Endo/Other  negative endocrine ROS  Renal/GU negative Renal ROS     Musculoskeletal   Abdominal   Peds  Hematology   Anesthesia Other Findings   Reproductive/Obstetrics                             Anesthesia Physical Anesthesia Plan  ASA: III  Anesthesia Plan: General   Post-op Pain Management:    Induction: Intravenous  PONV Risk Score and Plan: 1 and Ondansetron, Dexamethasone and Midazolam  Airway Management Planned: Oral ETT  Additional Equipment:   Intra-op Plan:   Post-operative Plan: Extubation in OR  Informed Consent: I have reviewed the patients History and Physical, chart, labs and discussed the procedure including the risks, benefits and alternatives for the proposed anesthesia with the patient or authorized representative who has indicated his/her understanding and acceptance.       Plan Discussed with: CRNA and Anesthesiologist  Anesthesia Plan Comments:         Anesthesia Quick Evaluation

## 2018-12-09 NOTE — Transfer of Care (Signed)
Immediate Anesthesia Transfer of Care Note  Patient: Sean Morales  Procedure(s) Performed: ATRIAL FIBRILLATION ABLATION (N/A )  Patient Location: Cath Lab  Anesthesia Type:General  Level of Consciousness: awake, alert  and patient cooperative  Airway & Oxygen Therapy: Patient Spontanous Breathing and Patient connected to face mask oxygen  Post-op Assessment: Report given to RN and Post -op Vital signs reviewed and stable  Post vital signs: Reviewed and stable  Last Vitals:  Vitals Value Taken Time  BP 148/90 12/09/18 1253  Temp    Pulse 89 12/09/18 1254  Resp 16 12/09/18 1254  SpO2 100 % 12/09/18 1254  Vitals shown include unvalidated device data.  Last Pain:  Vitals:   12/09/18 0842  TempSrc: Skin         Complications: No apparent anesthesia complications

## 2018-12-09 NOTE — Progress Notes (Signed)
No bleeding or swelling noted post ambulation 

## 2018-12-09 NOTE — Anesthesia Postprocedure Evaluation (Signed)
Anesthesia Post Note  Patient: Sean Morales  Procedure(s) Performed: ATRIAL FIBRILLATION ABLATION (N/A )     Patient location during evaluation: PACU Anesthesia Type: General Level of consciousness: awake Pain management: pain level controlled Vital Signs Assessment: post-procedure vital signs reviewed and stable Respiratory status: spontaneous breathing Cardiovascular status: stable Postop Assessment: no apparent nausea or vomiting Anesthetic complications: no    Last Vitals:  Vitals:   12/09/18 1530 12/09/18 1600  BP: 129/76 114/74  Pulse: 100 100  Resp: (!) 21 19  Temp:    SpO2: 99% 95%    Last Pain:  Vitals:   12/09/18 1600  TempSrc:   PainSc: 0-No pain                 Jakiyah Stepney

## 2018-12-10 ENCOUNTER — Encounter (HOSPITAL_COMMUNITY): Payer: Self-pay | Admitting: Internal Medicine

## 2018-12-11 ENCOUNTER — Telehealth: Payer: Self-pay | Admitting: Internal Medicine

## 2018-12-11 NOTE — Telephone Encounter (Signed)
New Message   Patient is calling because he has some questions about his recent ablation. He is concerned about swelling and bruising.

## 2018-12-11 NOTE — Telephone Encounter (Signed)
Returned call to Pt.  Pt states he has bruising around groin area, maybe 12 inches by 6 inches and his genital area is swollen.  Doesn't notice any hard lumps or areas.  Soft to touch.  States he can ambulate.    States his neck also has some swelling and his throat is sore-explained that was normal after being intubated for a procedure.  Discussed that bruising noted was probably normal part of this procedure, but this nurse would like to have Pt assessed.  Pt in agreement.  Pt will come to [facility] office tomorrow 12/12/2018 at 10:00 am for assessment.  Pt thanked nurse for assistance.

## 2018-12-12 ENCOUNTER — Other Ambulatory Visit: Payer: Self-pay

## 2018-12-12 ENCOUNTER — Ambulatory Visit (INDEPENDENT_AMBULATORY_CARE_PROVIDER_SITE_OTHER): Payer: BC Managed Care – PPO | Admitting: Student

## 2018-12-12 DIAGNOSIS — T148XXA Other injury of unspecified body region, initial encounter: Secondary | ICD-10-CM

## 2018-12-12 NOTE — Progress Notes (Signed)
Wound check in office pt s/p Afib ablation 12/09/2018  Pt states it was slightly swollen day after, and even more so yesterday am, 12/11/2018.   It feels much better this am per pt. Less swollen and he is able to move more freely/less stiff.  He denies pain or active bleeding. No fever or chills.   Pt has large area of dark ecchymosis  With dependent ecchymosis on his R thigh. + diffuse, soft edema. No hard areas, no auscultated bruits or pulsations. Slightly tender centrally.  No scrotal edema.  Discussed with Dr. Rayann Heman.  Pt should continue to groin precautions and take it easy for the next several days. He has plan to return to work on Tuesday, 12/16/2018, but will be sedentary.  I reviewed instructions and continued to recommend no squatting, lifting over 5 lbs for 1 week, no sexual activity for 1 week.   Pt knows to call back with any worsening or change in symptoms.  Shirley Friar, PA-C  12/12/2018 10:49 AM

## 2018-12-22 ENCOUNTER — Encounter (HOSPITAL_COMMUNITY): Payer: Self-pay

## 2018-12-24 ENCOUNTER — Other Ambulatory Visit (HOSPITAL_COMMUNITY): Payer: Self-pay | Admitting: Nurse Practitioner

## 2019-01-07 ENCOUNTER — Other Ambulatory Visit: Payer: Self-pay

## 2019-01-07 ENCOUNTER — Ambulatory Visit (HOSPITAL_COMMUNITY)
Admission: RE | Admit: 2019-01-07 | Discharge: 2019-01-07 | Disposition: A | Discharge status: Home / Self Care | Payer: BC Managed Care – PPO | Source: Ambulatory Visit | Attending: Nurse Practitioner | Admitting: Nurse Practitioner

## 2019-01-07 ENCOUNTER — Encounter (HOSPITAL_COMMUNITY): Payer: Self-pay | Admitting: Nurse Practitioner

## 2019-01-07 VITALS — BP 144/100 | HR 78 | Ht 72.0 in | Wt 315.2 lb

## 2019-01-07 DIAGNOSIS — I10 Essential (primary) hypertension: Secondary | ICD-10-CM | POA: Insufficient documentation

## 2019-01-07 DIAGNOSIS — F17210 Nicotine dependence, cigarettes, uncomplicated: Secondary | ICD-10-CM | POA: Diagnosis not present

## 2019-01-07 DIAGNOSIS — Z823 Family history of stroke: Secondary | ICD-10-CM | POA: Diagnosis not present

## 2019-01-07 DIAGNOSIS — I4819 Other persistent atrial fibrillation: Secondary | ICD-10-CM | POA: Insufficient documentation

## 2019-01-07 DIAGNOSIS — Z880 Allergy status to penicillin: Secondary | ICD-10-CM | POA: Insufficient documentation

## 2019-01-07 DIAGNOSIS — Z79899 Other long term (current) drug therapy: Secondary | ICD-10-CM | POA: Insufficient documentation

## 2019-01-07 DIAGNOSIS — K219 Gastro-esophageal reflux disease without esophagitis: Secondary | ICD-10-CM | POA: Insufficient documentation

## 2019-01-07 DIAGNOSIS — G4733 Obstructive sleep apnea (adult) (pediatric): Secondary | ICD-10-CM | POA: Insufficient documentation

## 2019-01-07 DIAGNOSIS — Z7901 Long term (current) use of anticoagulants: Secondary | ICD-10-CM | POA: Diagnosis not present

## 2019-01-07 DIAGNOSIS — Z882 Allergy status to sulfonamides status: Secondary | ICD-10-CM | POA: Insufficient documentation

## 2019-01-07 DIAGNOSIS — Z888 Allergy status to other drugs, medicaments and biological substances status: Secondary | ICD-10-CM | POA: Insufficient documentation

## 2019-01-07 LAB — BASIC METABOLIC PANEL
Anion gap: 9 (ref 5–15)
BUN: 14 mg/dL (ref 6–20)
CO2: 24 mmol/L (ref 22–32)
Calcium: 8.7 mg/dL — ABNORMAL LOW (ref 8.9–10.3)
Chloride: 106 mmol/L (ref 98–111)
Creatinine, Ser: 1.1 mg/dL (ref 0.61–1.24)
GFR calc Af Amer: >60 mL/min (ref >60)
GFR calc non Af Amer: >60 mL/min (ref >60)
Glucose, Bld: 97 mg/dL (ref 70–99)
Potassium: 4.2 mmol/L (ref 3.5–5.1)
Sodium: 139 mmol/L (ref 135–145)

## 2019-01-07 LAB — MAGNESIUM: Magnesium: 2.1 mg/dL (ref 1.7–2.4)

## 2019-01-07 NOTE — Progress Notes (Addendum)
Patient ID: Sean Morales, male   DOB: 25-Dec-1960, 58 y.o.   MRN: [id number]     Primary Care Physician: Sharilyn Sites, MD Referring Physician:  Dr. Rayann Heman EP: Dr. Merlene Morse is a 58 y.o. male with a h/o persistent afib on tikosyn therapy/xarelto. He has done well since 2016, when loaded on Tikosyn   until the last several weeks when he went back into afib/flutter. He had amiodarone in late 2015 and failed cardioversion. He had been persistent afib for a year at that time. He was seen by Dr. Rayann Heman 05/2014 who initiated tikosyn.   He returned to the clinic  for f/u cardioversion 06/03/18.Marland Kitchen He had SR for about 10 mins and then returned to afib. Other options discussed and he wanted to proceed with ablation. . Is very religious to wear cpap for treatment of OSA. Struggles with weight loss. Symptomatic with fatigue and shortness of breath in afib.  F/u in afib clinic one month past ablation. He has been staying in [place]. He did have bruising at the groin   that has resolved. Sore throat right after ablation that has also resolved. He continues to smoke 1 pack a day. He has gained 10 lbs.  He is trying to walk 5000 steps daily but has felt that he is still winded with exertion. He felt after ablation that he would feel better very quickly Today, I discussed Coumadin as well as novel anticoagulants including Pradaxa, Xarelto, Savaysa, and Eliquis today as indicated for risk reduction in stroke and systemic emboli with nonvalvular atrial fibrillation.  Risks, benefits, and alternatives to each of these drugs were discussed at length today. PND/orthopnea.  Today, he denies symptoms  of shortness of breath, orthopnea, PND, lower extremity edema, dizziness, presyncope, syncope, or neurologic sequela.Positive of symptoms as  listed above.The patient is tolerating medications without difficulties and is otherwise without complaint today.   Past Medical History:  Diagnosis Date  . Bilateral knee  pain   . Depression    no current meds  . DJD (degenerative joint disease)   . First degree heart block   . GERD (gastroesophageal reflux disease)   . Hypertension   . OA (osteoarthritis) of knee    bilateral  . Obstructive sleep apnea   . Persistent atrial fibrillation ([place]) first dx 10/ 2015-- previous primary cardiologist, [name]   followed by atrial fib. clinic-- [name] NP   Past Surgical History:  Procedure Laterality Date  . APPENDECTOMY  age 38  . ATRIAL FIBRILLATION ABLATION N/A 12/09/2018   Procedure: ATRIAL FIBRILLATION ABLATION;  Surgeon: Thompson Grayer, MD;  Location: [facility];  Service: Cardiovascular;  Laterality: N/A;  . CARDIOVASCULAR STRESS TEST  01-17-2015  [name]   Low risk nuclear study w/ no ischemia/  normal LV function and wal motion,  nuclear stress ef 60%  . CARDIOVERSION N/A 01/20/2015   Procedure: CARDIOVERSION;  Surgeon: [name], MD;  Location: [facility];  Service: Cardiovascular;  Laterality: N/A;  . CARDIOVERSION N/A 06/03/2018   Procedure: CARDIOVERSION;  Surgeon: [name], MD;  Location: [facility] ENDOSCOPY;  Service: Cardiovascular;  Laterality: N/A;  . COLONOSCOPY N/A 09/20/2017   Procedure: COLONOSCOPY;  Surgeon: [name], MD;  [facility];  Service: Endoscopy;  Laterality: N/A;  1:30pm  . colonscopy  age 30  . cysts removed from nipples  in high school   and neck  . ORIF RIGHT KNEE  age 40  .  POLYPECTOMY  09/20/2017   Procedure: POLYPECTOMY;  Surgeon: [name], MD;  [facility];  Service: Endoscopy;;  . TONSILLECTOMY  age 74  . TOTAL KNEE ARTHROPLASTY Bilateral 05/20/2017   Procedure: TOTAL KNEE BILATERAL;  Surgeon: [name], MD;  Location: [facility];  Service: Orthopedics;  Laterality: Bilateral;  Bilateral Adductor Block  . TRANSTHORACIC ECHOCARDIOGRAM  03/29/2017   ef 60-65%/  trivial AR, MR, and TR/  mild LAE/ moderate RAE/  PASP 56mmHg    Current Outpatient Medications   Medication Sig Dispense Refill  . acetaminophen (TYLENOL) 500 MG tablet Take 1,000 mg by mouth every 6 (six) hours as needed for moderate pain.    Marland Kitchen amLODipine (NORVASC) 10 MG tablet Take 1 tablet (10 mg total) by mouth every evening.    . dofetilide (TIKOSYN) 500 MCG capsule TAKE 1 CAPSULE BY MOUTH TWICE A DAY 60 capsule 5  . ibuprofen (ADVIL,MOTRIN) 200 MG tablet Take 400-800 mg by mouth every 6 (six) hours as needed for headache or moderate pain.    Marland Kitchen losartan (COZAAR) 100 MG tablet Take 1 tablet (100 mg total) by mouth daily. 30 tablet 6  . Magnesium 250 MG TABS Take 250 mg by mouth daily.     . metoprolol tartrate (LOPRESSOR) 25 MG tablet Take 1 tablet (25 mg total) by mouth 2 (two) times daily. 60 tablet 6  . omeprazole (PRILOSEC) 40 MG capsule Take 40 mg by mouth daily.     . rivaroxaban (XARELTO) 20 MG TABS tablet Take 1 tablet (20 mg total) by mouth daily with supper. 90 tablet 1   No current facility-administered medications for this encounter.     Allergies  Allergen Reactions  . Bee Venom Anaphylaxis    [place] Hornet  . Other     Co pyronil per patient not sure  . Penicillins Other (See Comments)    Unknown. Childhood allergy. Has patient had a PCN reaction causing immediate rash, facial/tongue/throat swelling, SOB or lightheadedness with hypotension: Unknown Has patient had a PCN reaction causing severe rash involving mucus membranes or skin necrosis: Unknown Has patient had a PCN reaction that required hospitalization: Unknown Has patient had a PCN reaction occurring within the last 10 years: No If all of the above answers are "NO", then may proceed with Cephalosporin use.   . Sulfa Antibiotics Other (See Comments)    Unknown. Childhood allergy.  . Tetracyclines & Related Other (See Comments)    Unknown. Childhood allergy.    Social History   Socioeconomic History  . Marital status: Married    Spouse name: [place]  . Number of children: 4  . Years of education:  73  . Highest education level: Not on file  Occupational History    Comment: Transport [employer]  Social Needs  . Financial resource strain: Not on file  . Food insecurity    Worry: Not on file    Inability: Not on file  . Transportation needs    Medical: Not on file    Non-medical: Not on file  Tobacco Use  . Smoking status: Current Every Day Smoker    Packs/day: 1.00    Years: 40.00    Pack years: 40.00    Types: Cigarettes    Start date: 01/08/1977  . Smokeless tobacco: Never Used  Substance and Sexual Activity  . Alcohol use: Yes    Alcohol/week: 1.0 - 2.0 standard drinks    Types: 1 - 2 Cans of beer per week  Comment: rarely  . Drug use: No  . Sexual activity: Yes  Lifestyle  . Physical activity    Days per week: Not on file    Minutes per session: Not on file  . Stress: Not on file  Relationships  . Social Herbalist on phone: Not on file    Gets together: Not on file    Attends religious service: Not on file    Active member of club or organization: Not on file    Attends meetings of clubs or organizations: Not on file    Relationship status: Not on file  . Intimate partner violence    Fear of current or ex partner: Not on file    Emotionally abused: Not on file    Physically abused: Not on file    Forced sexual activity: Not on file  Other Topics Concern  . Not on file  Social History Narrative   Lives in [place] (near [place]) with wife.   Patient 1 cup of coffee daily,occas. 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 Deep vein thrombosis Father   . Stroke Mother   . Colon cancer Neg Hx     ROS- All systems are reviewed and negative except as per the HPI above  Physical Exam: Vitals:   01/07/19 1532  BP: (!) 144/100  Pulse: 78  Weight: (!) 143 kg  Height: 6' (1.829 m)  Pulse Ox 97%  GEN- The patient is well appearing,  alert and oriented x 3 today.   Head- normocephalic, atraumatic Eyes-  Sclera clear, conjunctiva pink Ears- hearing intact Oropharynx- clear Neck- supple, no JVP Lymph- no cervical lymphadenopathy Lungs- Clear to ausculation bilaterally, normal work of breathing Heart- regular rate and rhythm, no murmurs, rubs or gallops, PMI not laterally displaced GI- soft, NT, ND, + BS Extremities- no clubbing, cyanosis, or 2+ edema MS- no significant deformity or atrophy Skin- no rash or lesion Psych- euthymic mood, full affect Neuro- strength and sensation are intact  EKG- sinus rhythm with first degree AV block, pr int 256 ms, qrs int 86 ms, qtc 467 ms Echo- 2018-Study Conclusions  - Left ventricle: The cavity size was normal. Wall thickness was   normal. Systolic function was normal. The estimated ejection   fraction was in the range of 60% to 65%. Mildly reduced GLS at   -16%. Wall motion was normal; there were no regional wall motion   abnormalities. Left ventricular diastolic function parameters   were normal. - Aortic valve: Trileaflet. Sclerosis without stenosis. There was   trivial regurgitation. - Mitral valve: Mildly thickened leaflets . There was trivial   regurgitation. - Left atrium: The atrium was mildly dilated. - Right atrium: Moderately dilated. - Tricuspid valve: There was trivial regurgitation. - Pulmonary arteries: PA peak pressure: 33 mm Hg (S). - Inferior vena cava: The vessel was dilated. The respirophasic   diameter changes were blunted (< 50%), consistent with elevated   central venous pressure.  Impressions:  - Compared to a prior study in 2015, the LVEF is higher at 123456 -   diastolic function is normal. There is moderate RAE and mild LAE.  Assessment and Plan: 1. Persistent afib/flutter S/p ablation one month ago Pt encouraged to continue walking routine and lose weight  I feel exertional dyspnea will improve with additional healing period and  working on  life style Smoking cessation encouraged  Continue tikosyn 500 mcg bid Continue metoprolol 25 mg bid  Continue xarelto for chadsvasc score of at least 1    2. HTN Elevated at 144/100  Continue amlodipine, losartan, metoprolol  Avoid salt Weight loss   3. BMI 42.75 Encouraged weight loss   F/u with Dr. Rayann Heman 12/10  Geroge Baseman. Iridessa Harrow, [facility] [address] [place], [place] [address] [phone]

## 2019-01-07 NOTE — Addendum Note (Signed)
Encounter addended by: Sherran Needs, NP on: 01/07/2019 4:42 PM  Actions taken: Clinical Note Signed, Medication List reviewed, Problem List reviewed, Allergies reviewed

## 2019-01-19 DIAGNOSIS — Z23 Encounter for immunization: Secondary | ICD-10-CM | POA: Diagnosis not present

## 2019-01-28 ENCOUNTER — Other Ambulatory Visit: Payer: Self-pay | Admitting: Internal Medicine

## 2019-01-28 NOTE — Telephone Encounter (Signed)
Xarelto 20mg  refill request received. Pt is 58 years old, weight- 143kg, Crea-1.10 on 01/07/2019, last seen by Roderic Palau on 01/07/2019, Diagnosis-Afib, CrCl-148.84ml/min; Dose is appropriate based on dosing criteria. Will send in refill to requested pharmacy.

## 2019-02-13 ENCOUNTER — Other Ambulatory Visit (HOSPITAL_COMMUNITY): Payer: Self-pay | Admitting: Nurse Practitioner

## 2019-02-17 ENCOUNTER — Other Ambulatory Visit (HOSPITAL_COMMUNITY): Payer: Self-pay | Admitting: *Deleted

## 2019-02-17 MED ORDER — AMLODIPINE BESYLATE 10 MG PO TABS: 10.0000 mg | ORAL_TABLET | Freq: Every evening | ORAL | 6 refills | Status: DC

## 2019-03-09 ENCOUNTER — Ambulatory Visit (INDEPENDENT_AMBULATORY_CARE_PROVIDER_SITE_OTHER): Payer: BC Managed Care – PPO | Admitting: Podiatry

## 2019-03-09 ENCOUNTER — Other Ambulatory Visit: Payer: Self-pay

## 2019-03-09 ENCOUNTER — Encounter: Payer: Self-pay | Admitting: Podiatry

## 2019-03-09 DIAGNOSIS — M79676 Pain in unspecified toe(s): Secondary | ICD-10-CM | POA: Diagnosis not present

## 2019-03-09 DIAGNOSIS — B351 Tinea unguium: Secondary | ICD-10-CM

## 2019-03-09 DIAGNOSIS — L603 Nail dystrophy: Secondary | ICD-10-CM | POA: Diagnosis not present

## 2019-03-09 DIAGNOSIS — L608 Other nail disorders: Secondary | ICD-10-CM

## 2019-03-09 MED ORDER — GENTAMICIN SULFATE 0.1 % EX CREA: 1.0000 "application " | TOPICAL_CREAM | Freq: Two times a day (BID) | CUTANEOUS | 1 refills | Status: DC

## 2019-03-09 NOTE — Patient Instructions (Signed)

## 2019-03-11 NOTE — Progress Notes (Signed)
   Subjective: Patient presents today for evaluation of pain to the right great toenail and the left third toenail that have both been symptomatic intermittently for the past few years. Patient is concerned for possible ingrown nail. Wearing shoes and applying pressure to the toes increases the pain. She has not had any treatment. Patient presents today for further treatment and evaluation.  Past Medical History:  Diagnosis Date  . Bilateral knee pain   . Depression    no current meds  . DJD (degenerative joint disease)   . First degree heart block   . GERD (gastroesophageal reflux disease)   . Hypertension   . OA (osteoarthritis) of knee    bilateral  . Obstructive sleep apnea   . Persistent atrial fibrillation ([place]) first dx 10/ 2015-- previous primary cardiologist, [name]   followed by atrial fib. clinic-- [name] NP    Objective:  General: Well developed, nourished, in no acute distress, alert and oriented x3   Dermatology: Skin is warm, dry and supple bilateral. Pincer nails noted to the right great toe and left third toe. The remaining nails appear unremarkable at this time. There are no open sores, lesions.  Vascular: Dorsalis Pedis artery and Posterior Tibial artery pedal pulses palpable. No lower extremity edema noted.   Neruologic: Grossly intact via light touch bilateral.  Musculoskeletal: Muscular strength within normal limits in all groups bilateral. Normal range of motion noted to all pedal and ankle joints.   Assesement: #1 Ingrown / pincer nail right great toe #2 ingrown / pincer nail left 3rd toe   Plan of Care:  1. Patient evaluated.  2. Discussed treatment alternatives and plan of care. Explained nail avulsion procedure and post procedure course to patient. 3. Patient opted for permanent total nail avulsion of the right great toe and the left third toe.  4. Prior to procedure, local anesthesia infiltration utilized using 3 ml of a 50:50 mixture of  2% plain lidocaine and 0.5% plain marcaine in a normal hallux and digital block fashion and a betadine prep performed.  5. Total permanent nail avulsion with chemical matrixectomy performed using [id number] applications of phenol followed by alcohol flush.  6. Light dressing applied. 7. Prescription for Gentamicin cream provided to patient to use daily with a bandage.  8. Return to clinic in 2 weeks.  Edrick Kins, DPM [facility]  Dr. Edrick Kins, [address]                                        [place], [place] [address]                Office [phone]  Fax [phone]

## 2019-03-12 ENCOUNTER — Encounter: Payer: Self-pay | Admitting: Internal Medicine

## 2019-03-12 ENCOUNTER — Ambulatory Visit (INDEPENDENT_AMBULATORY_CARE_PROVIDER_SITE_OTHER): Payer: BC Managed Care – PPO | Admitting: Internal Medicine

## 2019-03-12 ENCOUNTER — Other Ambulatory Visit: Payer: Self-pay

## 2019-03-12 VITALS — BP 142/94 | HR 80 | Ht 72.0 in | Wt 312.8 lb

## 2019-03-12 DIAGNOSIS — I10 Essential (primary) hypertension: Secondary | ICD-10-CM

## 2019-03-12 DIAGNOSIS — D6869 Other thrombophilia: Secondary | ICD-10-CM

## 2019-03-12 DIAGNOSIS — I4819 Other persistent atrial fibrillation: Secondary | ICD-10-CM

## 2019-03-12 DIAGNOSIS — G4733 Obstructive sleep apnea (adult) (pediatric): Secondary | ICD-10-CM | POA: Diagnosis not present

## 2019-03-12 DIAGNOSIS — Z9989 Dependence on other enabling machines and devices: Secondary | ICD-10-CM

## 2019-03-12 NOTE — Progress Notes (Signed)
PCP: Sharilyn Sites, MD  Sean Morales is a 58 y.o. male who presents today for routine electrophysiology followup.  Since his recent afib ablation, the patient reports doing very well.  he denies procedure related complications and is pleased with the results of the procedure.  Today, he denies symptoms of palpitations, chest pain, shortness of breath,  lower extremity edema, dizziness, presyncope, or syncope.  The patient is otherwise without complaint today.   Past Medical History:  Diagnosis Date  . Bilateral knee pain   . Depression    no current meds  . DJD (degenerative joint disease)   . First degree heart block   . GERD (gastroesophageal reflux disease)   . Hypertension   . OA (osteoarthritis) of knee    bilateral  . Obstructive sleep apnea   . Persistent atrial fibrillation ([place]) first dx 10/ 2015-- previous primary cardiologist, [name]   followed by atrial fib. clinic-- [name] NP   Past Surgical History:  Procedure Laterality Date  . APPENDECTOMY  age 41  . ATRIAL FIBRILLATION ABLATION N/A 12/09/2018   Procedure: ATRIAL FIBRILLATION ABLATION;  Surgeon: Thompson Grayer, MD;  Location: [facility];  Service: Cardiovascular;  Laterality: N/A;  . CARDIOVASCULAR STRESS TEST  01-17-2015  [name]   Low risk nuclear study w/ no ischemia/  normal LV function and wal motion,  nuclear stress ef 60%  . CARDIOVERSION N/A 01/20/2015   Procedure: CARDIOVERSION;  Surgeon: [name], MD;  Location: [facility];  Service: Cardiovascular;  Laterality: N/A;  . CARDIOVERSION N/A 06/03/2018   Procedure: CARDIOVERSION;  Surgeon: [name], MD;  Location: [facility] ENDOSCOPY;  Service: Cardiovascular;  Laterality: N/A;  . COLONOSCOPY N/A 09/20/2017   Procedure: COLONOSCOPY;  Surgeon: [name], MD;  Location: [facility];  Service: Endoscopy;  Laterality: N/A;  1:30pm  . colonscopy  age 5  . cysts removed from nipples  in high school   and neck  . ORIF  RIGHT KNEE  age 36  . POLYPECTOMY  09/20/2017   Procedure: POLYPECTOMY;  Surgeon: [name], MD;  [facility]: [facility];  Service: Endoscopy;;  . TONSILLECTOMY  age 65  . TOTAL KNEE ARTHROPLASTY Bilateral 05/20/2017   Procedure: TOTAL KNEE BILATERAL;  Surgeon: [name], MD;  Location: [facility];  Service: Orthopedics;  Laterality: Bilateral;  Bilateral Adductor Block  . TRANSTHORACIC ECHOCARDIOGRAM  03/29/2017   ef 60-65%/  trivial AR, MR, and TR/  mild LAE/ moderate RAE/  PASP 57mmHg    ROS- all systems are personally reviewed and negatives except as per HPI above  Current Outpatient Medications  Medication Sig Dispense Refill  . acetaminophen (TYLENOL) 500 MG tablet Take 1,000 mg by mouth every 6 (six) hours as needed for moderate pain.    Marland Kitchen amLODipine (NORVASC) 10 MG tablet Take 1 tablet (10 mg total) by mouth every evening. 30 tablet 6  . benzonatate (TESSALON) 100 MG capsule Take 200 mg by mouth every 8 (eight) hours as needed.    . brompheniramine-pseudoephedrine-DM 30-2-10 MG/5ML syrup Take 10 mLs by mouth every 6 (six) hours as needed.    . dofetilide (TIKOSYN) 500 MCG capsule TAKE 1 CAPSULE BY MOUTH TWICE A DAY 60 capsule 5  . gentamicin cream (GARAMYCIN) 0.1 % Apply 1 application topically 2 (two) times daily. 15 g 1  . ibuprofen (ADVIL,MOTRIN) 200 MG tablet Take 400-800 mg by mouth every 6 (six) hours as needed for headache or moderate pain.    Marland Kitchen  losartan (COZAAR) 100 MG tablet TAKE 1 TABLET BY MOUTH EVERY DAY 90 tablet 2  . Magnesium 250 MG TABS Take 250 mg by mouth daily.     . metoprolol tartrate (LOPRESSOR) 25 MG tablet Take 1 tablet (25 mg total) by mouth 2 (two) times daily. 60 tablet 6  . omeprazole (PRILOSEC) 40 MG capsule Take 40 mg by mouth daily.     Alveda Reasons 20 MG TABS tablet TAKE 1 TABLET (20 MG TOTAL) BY MOUTH DAILY WITH SUPPER. 90 tablet 3   No current facility-administered medications for this visit.    Physical Exam: Vitals:   03/12/19 1603  BP:  (!) 142/94  Pulse: 80  SpO2: 95%  Weight: (!) 312 lb 12.8 oz (141.9 kg)  Height: 6' (1.829 m)    GEN- The patient is well appearing, alert and oriented x 3 today.   Head- normocephalic, atraumatic Eyes-  Sclera clear, conjunctiva pink Ears- hearing intact Oropharynx- clear Lungs-   normal work of breathing Heart- Regular rate and rhythm  GI- soft  Extremities- no clubbing, cyanosis, or edema  EKG tracing ordered today is personally reviewed and shows sinus rhythm, first degree AV block, Qtc 461 msec  Assessment and Plan:  1. Persistent atrial fibrillation Doing well s/p ablation continue tikosyn for 3 more months,  He may want to stop at that time chads2vasc score is 1 We discussed pros and cons of anticoagulation.  His father and PGF had "clots".  He is leaning towards long term anticoagulation but may reconsider in 3 months.  2. HTN Stable No change required today  3. Morbid obesity Lifestyle modification is advised  4. OSA Compliance with CPAP advised  Return to see me in 3 months  Thompson Grayer MD, [facility] 03/12/2019 4:19 PM

## 2019-03-12 NOTE — Patient Instructions (Addendum)
Medication Instructions:  Your physician recommends that you continue on your current medications as directed. Please refer to the Current Medication list given to you today.  Labwork: None ordered.  Testing/Procedures: None ordered.  Follow-Up: Your physician wants you to follow-up in: 3 months with Dr. Rayann Heman.    June 10, 2019 at 11:45 am MyChart visit with Dr. Rayann Heman    Any Other Special Instructions Will Be Listed Below (If Applicable).  If you need a refill on your cardiac medications before your next appointment, please call your pharmacy.

## 2019-03-16 DIAGNOSIS — M545 Low back pain: Secondary | ICD-10-CM | POA: Diagnosis not present

## 2019-03-19 ENCOUNTER — Other Ambulatory Visit (HOSPITAL_COMMUNITY): Payer: Self-pay | Admitting: Nurse Practitioner

## 2019-03-23 ENCOUNTER — Ambulatory Visit (INDEPENDENT_AMBULATORY_CARE_PROVIDER_SITE_OTHER): Payer: BC Managed Care – PPO | Admitting: Podiatry

## 2019-03-23 ENCOUNTER — Other Ambulatory Visit: Payer: Self-pay

## 2019-03-23 DIAGNOSIS — M79676 Pain in unspecified toe(s): Secondary | ICD-10-CM | POA: Diagnosis not present

## 2019-03-23 DIAGNOSIS — L603 Nail dystrophy: Secondary | ICD-10-CM | POA: Diagnosis not present

## 2019-03-23 DIAGNOSIS — B351 Tinea unguium: Secondary | ICD-10-CM

## 2019-03-25 DIAGNOSIS — M545 Low back pain: Secondary | ICD-10-CM | POA: Diagnosis not present

## 2019-03-29 NOTE — Progress Notes (Signed)
   Subjective: 58 y.o. male presenting today status post total permanent nail avulsion procedure of the right great toe and the left 3rd toe that was performed on 03/04/2019. He states he is doing well. He reports only mild tenderness caused by touching the area. He has been applying Gentamicin cream as directed. Patient is here for further evaluation and treatment.    Past Medical History:  Diagnosis Date  . Bilateral knee pain   . Depression    no current meds  . DJD (degenerative joint disease)   . First degree heart block   . GERD (gastroesophageal reflux disease)   . Hypertension   . OA (osteoarthritis) of knee    bilateral  . Obstructive sleep apnea   . Persistent atrial fibrillation ([place]) first dx 10/ 2015-- previous primary cardiologist, [name]   followed by atrial fib. clinic-- [name] NP     Objective: Skin is warm, dry and supple. Nail bed and respective nail fold appears to be healing appropriately. Open wound to the associated nail fold with a granular wound base and moderate amount of fibrotic tissue. Minimal drainage noted. Mild erythema around the periungual region likely due to phenol chemical matricectomy.  Assessment: #1 postop permanent total nail avulsion right great toe, left third toe  Plan of care: #1 patient was evaluated  #2 light debridement of open wound was performed to the periungual border of the respective toe using a currette and tissue nipper. Antibiotic ointment and Band-Aid was applied. #3 patient is to return to clinic on a PRN  basis.   Edrick Kins, DPM [facility]  Dr. Edrick Kins, DPM    [address], [place] [address]                Office [phone]  Fax [phone]

## 2019-04-01 DIAGNOSIS — M5126 Other intervertebral disc displacement, lumbar region: Secondary | ICD-10-CM | POA: Diagnosis not present

## 2019-04-01 DIAGNOSIS — E669 Obesity, unspecified: Secondary | ICD-10-CM | POA: Diagnosis not present

## 2019-04-01 DIAGNOSIS — M519 Unspecified thoracic, thoracolumbar and lumbosacral intervertebral disc disorder: Secondary | ICD-10-CM | POA: Diagnosis not present

## 2019-04-01 DIAGNOSIS — M545 Low back pain: Secondary | ICD-10-CM | POA: Diagnosis not present

## 2019-05-05 ENCOUNTER — Other Ambulatory Visit: Payer: Self-pay

## 2019-05-05 ENCOUNTER — Ambulatory Visit (HOSPITAL_COMMUNITY)
Admission: RE | Admit: 2019-05-05 | Discharge: 2019-05-05 | Disposition: A | Discharge status: Home / Self Care | Payer: BC Managed Care – PPO | Source: Ambulatory Visit | Attending: Nurse Practitioner | Admitting: Nurse Practitioner

## 2019-05-05 ENCOUNTER — Encounter (HOSPITAL_COMMUNITY): Payer: Self-pay | Admitting: Nurse Practitioner

## 2019-05-05 VITALS — BP 164/110 | HR 83 | Ht 72.0 in | Wt 313.0 lb

## 2019-05-05 DIAGNOSIS — G4733 Obstructive sleep apnea (adult) (pediatric): Secondary | ICD-10-CM | POA: Diagnosis not present

## 2019-05-05 DIAGNOSIS — I4819 Other persistent atrial fibrillation: Secondary | ICD-10-CM

## 2019-05-05 DIAGNOSIS — I4892 Unspecified atrial flutter: Secondary | ICD-10-CM | POA: Insufficient documentation

## 2019-05-05 DIAGNOSIS — Z823 Family history of stroke: Secondary | ICD-10-CM | POA: Diagnosis not present

## 2019-05-05 DIAGNOSIS — Z880 Allergy status to penicillin: Secondary | ICD-10-CM | POA: Insufficient documentation

## 2019-05-05 DIAGNOSIS — I4891 Unspecified atrial fibrillation: Secondary | ICD-10-CM

## 2019-05-05 DIAGNOSIS — Z79899 Other long term (current) drug therapy: Secondary | ICD-10-CM | POA: Insufficient documentation

## 2019-05-05 DIAGNOSIS — I10 Essential (primary) hypertension: Secondary | ICD-10-CM | POA: Diagnosis not present

## 2019-05-05 DIAGNOSIS — Z882 Allergy status to sulfonamides status: Secondary | ICD-10-CM | POA: Insufficient documentation

## 2019-05-05 DIAGNOSIS — Z7901 Long term (current) use of anticoagulants: Secondary | ICD-10-CM | POA: Insufficient documentation

## 2019-05-05 DIAGNOSIS — F17210 Nicotine dependence, cigarettes, uncomplicated: Secondary | ICD-10-CM | POA: Insufficient documentation

## 2019-05-05 DIAGNOSIS — Z888 Allergy status to other drugs, medicaments and biological substances status: Secondary | ICD-10-CM | POA: Diagnosis not present

## 2019-05-05 DIAGNOSIS — D6869 Other thrombophilia: Secondary | ICD-10-CM

## 2019-05-05 LAB — CBC
HCT: 50.4 % (ref 39.0–52.0)
Hemoglobin: 15.9 g/dL (ref 13.0–17.0)
MCH: 29.3 pg (ref 26.0–34.0)
MCHC: 31.5 g/dL (ref 30.0–36.0)
MCV: 93 fL (ref 80.0–100.0)
Platelets: 242 10*3/uL (ref 150–400)
RBC: 5.42 MIL/uL (ref 4.22–5.81)
RDW: 13.1 % (ref 11.5–15.5)
WBC: 8.1 10*3/uL (ref 4.0–10.5)
nRBC: 0 % (ref 0.0–0.2)

## 2019-05-05 LAB — BASIC METABOLIC PANEL
Anion gap: 11 (ref 5–15)
BUN: 15 mg/dL (ref 6–20)
CO2: 26 mmol/L (ref 22–32)
Calcium: 9 mg/dL (ref 8.9–10.3)
Chloride: 105 mmol/L (ref 98–111)
Creatinine, Ser: 0.96 mg/dL (ref 0.61–1.24)
GFR calc Af Amer: >60 mL/min (ref >60)
GFR calc non Af Amer: >60 mL/min (ref >60)
Glucose, Bld: 98 mg/dL (ref 70–99)
Potassium: 4.4 mmol/L (ref 3.5–5.1)
Sodium: 142 mmol/L (ref 135–145)

## 2019-05-05 LAB — TSH: TSH: 1.684 u[IU]/mL (ref 0.350–4.500)

## 2019-05-05 LAB — MAGNESIUM: Magnesium: 2.2 mg/dL (ref 1.7–2.4)

## 2019-05-05 NOTE — Progress Notes (Addendum)
Patient ID: Sean Morales, male   DOB: 08/06/1960, 59 y.o.   MRN: [id number]     Primary Care Physician: Sharilyn Sites, MD Referring Physician:  Dr. Rayann Heman EP: Dr. Merlene Morse is a 59 y.o. male with a h/o persistent afib on tikosyn therapy/xarelto. He has done well since 2016, when loaded on Tikosyn   until the last several weeks when he went back into afib/flutter. He had amiodarone in late 2015 and failed cardioversion. He had been persistent afib for a year at that time. He was seen by Dr. Rayann Heman 05/2014 who initiated tikosyn.   He returned to the clinic  for f/u cardioversion 06/03/18.Marland Kitchen He had SR for about 10 mins and then returned to afib. Other options discussed and he wanted to proceed with ablation. . Is very religious to wear cpap for treatment of OSA. Struggles with weight loss. Symptomatic with fatigue and shortness of breath in afib.  F/u in afib clinic one month past ablation. He has been staying in [place]. He did have bruising at the groin   that has resolved. Sore throat right after ablation that has also resolved. He continues to smoke 1 pack a day. He has gained 10 lbs.  He is trying to walk 5000 steps daily but has felt that he is still winded with exertion. He felt after ablation that he would feel better very quickly Today, I discussed Coumadin as well as novel anticoagulants including Pradaxa, Xarelto, Savaysa, and Eliquis today as indicated for risk reduction in stroke and systemic emboli with nonvalvular atrial fibrillation.  Risks, benefits, and alternatives to each of these drugs were discussed at length today. PND/orthopnea.  F/u in afib clinic, 05/05/19. He asked to be seen this am as he has not felt well for around 2-3 weeks. He has noted headaches/ extreme fatgiue/ fluttering of the heart that would only last minutes. His  symptoms have improved but linger. He  was exposed to covid 03/30/19 from his adult daughter. 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 He  did not notice any sinus issues or loss of smell/taste. He is SR today. Weight around the same. BP elevated this am as he did not take his am meds yet. Continues on xarelto 20 mg daily for at least 1.   Today, he denies symptoms  of shortness of breath, orthopnea, PND, lower extremity edema, dizziness, presyncope, syncope, or neurologic sequela.Positive of symptoms as  listed above.The patient is tolerating medications without difficulties and is otherwise without complaint today.   Past Medical History:  Diagnosis Date  . Bilateral knee pain   . Depression    no current meds  . DJD (degenerative joint disease)   . First degree heart block   . GERD (gastroesophageal reflux disease)   . Hypertension   . OA (osteoarthritis) of knee    bilateral  . Obstructive sleep apnea   . Persistent atrial fibrillation ([place]) first dx 10/ 2015-- previous primary cardiologist, [name]   followed by atrial fib. clinic-- [name] NP   Past Surgical History:  Procedure Laterality Date  . APPENDECTOMY  age 7  . ATRIAL FIBRILLATION ABLATION N/A 12/09/2018   Procedure: ATRIAL FIBRILLATION ABLATION;  Surgeon: Thompson Grayer, MD;  Location: [facility];  Service: Cardiovascular;  Laterality: N/A;  . CARDIOVASCULAR STRESS TEST  01-17-2015  [name]  Low risk nuclear study w/ no ischemia/  normal LV function and wal motion,  nuclear stress ef 60%  . CARDIOVERSION N/A 01/20/2015   Procedure: CARDIOVERSION;  Surgeon: [name], MD;  Location: [facility];  Service: Cardiovascular;  Laterality: N/A;  . CARDIOVERSION N/A 06/03/2018   Procedure: CARDIOVERSION;  Surgeon: [name], MD;  Location: [facility] ENDOSCOPY;  Service: Cardiovascular;  Laterality: N/A;  . COLONOSCOPY N/A 09/20/2017   Procedure: COLONOSCOPY;  Surgeon: [name], MD;  [facility];  Service: Endoscopy;  Laterality: N/A;  1:30pm  .  colonscopy  age 2  . cysts removed from nipples  in high school   and neck  . ORIF RIGHT KNEE  age 48  . POLYPECTOMY  09/20/2017   Procedure: POLYPECTOMY;  Surgeon: [name], MD;  [facility];  Service: Endoscopy;;  . TONSILLECTOMY  age 39  . TOTAL KNEE ARTHROPLASTY Bilateral 05/20/2017   Procedure: TOTAL KNEE BILATERAL;  Surgeon: [name], MD;  Location: [facility];  Service: Orthopedics;  Laterality: Bilateral;  Bilateral Adductor Block  . TRANSTHORACIC ECHOCARDIOGRAM  03/29/2017   ef 60-65%/  trivial AR, MR, and TR/  mild LAE/ moderate RAE/  PASP 62mmHg    Current Outpatient Medications  Medication Sig Dispense Refill  . acetaminophen (TYLENOL) 500 MG tablet Take 1,000 mg by mouth as needed for moderate pain.     Marland Kitchen amLODipine (NORVASC) 10 MG tablet Take 1 tablet (10 mg total) by mouth every evening. 30 tablet 6  . amoxicillin (AMOXIL) 500 MG capsule TAKE 4 CAPSULES BY MOUTH 1 HOUR PRIOR TO APPOINTMENT    . dofetilide (TIKOSYN) 500 MCG capsule TAKE 1 CAPSULE BY MOUTH TWICE A DAY 60 capsule 5  . ibuprofen (ADVIL,MOTRIN) 200 MG tablet Take 400-800 mg by mouth as needed for headache or moderate pain.     Marland Kitchen losartan (COZAAR) 100 MG tablet TAKE 1 TABLET BY MOUTH EVERY DAY 90 tablet 2  . Magnesium 250 MG TABS Take 250 mg by mouth daily.     . metoprolol tartrate (LOPRESSOR) 25 MG tablet TAKE 1 TABLET BY MOUTH TWICE A DAY 180 tablet 2  . omeprazole (PRILOSEC) 40 MG capsule Take 40 mg by mouth daily.     Alveda Reasons 20 MG TABS tablet TAKE 1 TABLET (20 MG TOTAL) BY MOUTH DAILY WITH SUPPER. 90 tablet 3   No current facility-administered medications for this encounter.    Allergies  Allergen Reactions  . Bee Venom Anaphylaxis    [place] Hornet  . Other     Co pyronil per patient not sure  . Penicillins Other (See Comments)    Unknown. Childhood allergy. Has patient had a PCN reaction causing immediate rash, facial/tongue/throat swelling, SOB or lightheadedness with  hypotension: Unknown Has patient had a PCN reaction causing severe rash involving mucus membranes or skin necrosis: Unknown Has patient had a PCN reaction that required hospitalization: Unknown Has patient had a PCN reaction occurring within the last 10 years: No If all of the above answers are "NO", then may proceed with Cephalosporin use.   . Sulfa Antibiotics Other (See Comments)    Unknown. Childhood allergy.  . Tetracyclines & Related Other (See Comments)    Unknown. Childhood allergy.    Social History   Socioeconomic History  . Marital status: Married    Spouse name: [place]  . Number of children: 4  . Years of education: 43  . Highest education level: Not on file  Occupational History    Comment: Transport pl[employer]  Tobacco Use  . Smoking status: Current Every Day Smoker    Packs/day: 1.00    Years: 40.00    Pack years: 40.00    Types: Cigarettes    Start date: 01/08/1977  . Smokeless tobacco: Never Used  Substance and Sexual Activity  . Alcohol use: Yes    Alcohol/week: 1.0 - 2.0 standard drinks    Types: 1 - 2 Cans of beer per week    Comment: rarely  . Drug use: No  . Sexual activity: Yes  Other Topics Concern  . Not on file  Social History Narrative   Lives in [place] (near [place]) with wife.   Patient 1 cup of coffee daily,occas. 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 Difficulty of Paying Living Expenses: Not on file  Food Insecurity:   . Worried About Charity fundraiser in the Last Year: Not on file  . Ran Out of Food in the Last Year: Not on file  Transportation Needs:   . Lack of Transportation (Medical): Not on file  . Lack of Transportation (Non-Medical): Not on file  Physical Activity:   . Days of Exercise per Week: Not on file  . Minutes of Exercise per Session: Not on file  Stress:   . Feeling of Stress : Not on file    Social Connections:   . Frequency of Communication with Friends and Family: Not on file  . Frequency of Social Gatherings with Friends and Family: Not on file  . Attends Religious Services: Not on file  . Active Member of Clubs or Organizations: Not on file  . Attends Archivist Meetings: Not on file  . Marital Status: Not on file  Intimate Partner Violence:   . Fear of Current or Ex-Partner: Not on file  . Emotionally Abused: Not on file  . Physically Abused: Not on file  . Sexually Abused: Not on file    Family History  Problem Relation Age of Onset  . Deep vein thrombosis Father   . Stroke Mother   . Colon cancer Neg Hx     ROS- All systems are reviewed and negative except as per the HPI above  Physical Exam: Vitals:   05/05/19 0919  BP: (!) 164/110  Pulse: 83  Weight: (!) 142 kg  Height: 6' (1.829 m)  Pulse Ox 97%  GEN- The patient is well appearing, alert and oriented x 3 today.   Head- normocephalic, atraumatic Eyes-  Sclera clear, conjunctiva pink Ears- hearing intact Oropharynx- clear Neck- supple, no JVP Lymph- no cervical lymphadenopathy Lungs- Clear to ausculation bilaterally, normal work of breathing Heart- regular rate and rhythm, no murmurs, rubs or gallops, PMI not laterally displaced GI- soft, NT, ND, + BS Extremities- no clubbing, cyanosis, or 2+ edema MS- no significant deformity or atrophy Skin- no rash or lesion Psych- euthymic mood, full affect Neuro- strength and sensation are intact  EKG- sinus rhythm with first degree AV block, pr int 304 ms, qrs int 84 ms, qtc 458 ms Echo- 2018-Study Conclusions  - Left ventricle: The cavity size was normal. Wall thickness was   normal. Systolic function was normal. The estimated ejection   fraction was in the range of 60% to 65%. Mildly reduced GLS at   -16%. Wall motion was normal; there were  no regional wall motion   abnormalities. Left ventricular diastolic function parameters   were  normal. - Aortic valve: Trileaflet. Sclerosis without stenosis. There was   trivial regurgitation. - Mitral valve: Mildly thickened leaflets . There was trivial   regurgitation. - Left atrium: The atrium was mildly dilated. - Right atrium: Moderately dilated. - Tricuspid valve: There was trivial regurgitation. - Pulmonary arteries: PA peak pressure: 33 mm Hg (S). - Inferior vena cava: The vessel was dilated. The respirophasic   diameter changes were blunted (< 50%), consistent with elevated   central venous pressure.  Impressions:  - Compared to a prior study in 2015, the LVEF is higher at 123456 -   diastolic function is normal. There is moderate RAE and mild LAE.  Assessment and Plan: 1. Persistent afib/flutter S/p ablation 12/2017 Continue tikosyn 500 mcg bid Continue metoprolol 25 mg bid  Continue xarelto for chadsvasc score of at least 1  I will place a zio patch for one week for intermittent fluttering   2. HTN Elevated today at 164/110 but did not take am meds  Continue amlodipine, losartan, metoprolol  Avoid salt Weight loss   3.  Intermittent H/A/fatigue x 2-3 weeks, slowly improving ? Etiology  Possible covid, had exposure  Does not sound consistent with intermittent afib I will see if any afib noted on monitor  but  if symptoms persist see PCP CBC/bmet/TSH/mag today    F/u with Dr. Rayann Heman 3/10  afib clinic as needed  Butch Penny C. [place], [facility] [address] [place], [place] [address] [phone]

## 2019-05-25 ENCOUNTER — Other Ambulatory Visit (HOSPITAL_COMMUNITY): Payer: Self-pay | Admitting: Nurse Practitioner

## 2019-05-25 NOTE — Addendum Note (Signed)
Encounter addended by: Juluis Mire, RN on: 05/25/2019 9:06 AM  Actions taken: Imaging Exam ended

## 2019-06-10 ENCOUNTER — Telehealth: Payer: BC Managed Care – PPO | Admitting: Internal Medicine

## 2019-06-17 ENCOUNTER — Telehealth (INDEPENDENT_AMBULATORY_CARE_PROVIDER_SITE_OTHER): Payer: BC Managed Care – PPO | Admitting: Internal Medicine

## 2019-06-17 ENCOUNTER — Other Ambulatory Visit: Payer: Self-pay

## 2019-06-17 DIAGNOSIS — Z9989 Dependence on other enabling machines and devices: Secondary | ICD-10-CM

## 2019-06-17 DIAGNOSIS — D6869 Other thrombophilia: Secondary | ICD-10-CM

## 2019-06-17 DIAGNOSIS — I4819 Other persistent atrial fibrillation: Secondary | ICD-10-CM | POA: Diagnosis not present

## 2019-06-17 DIAGNOSIS — G4733 Obstructive sleep apnea (adult) (pediatric): Secondary | ICD-10-CM

## 2019-06-17 DIAGNOSIS — I10 Essential (primary) hypertension: Secondary | ICD-10-CM

## 2019-06-17 DIAGNOSIS — I441 Atrioventricular block, second degree: Secondary | ICD-10-CM

## 2019-06-17 NOTE — Progress Notes (Signed)
Electrophysiology TeleHealth Note   Due to national recommendations of social distancing due to COVID 19, an audio/video telehealth visit is felt to be most appropriate for this patient at this time.  See MyChart message from today for the patient's consent to telehealth for [facility].  Date:  06/17/2019   ID:  Sean Morales, DOB May 27, 1960, MRN [id number]  Location: patient's home  Provider location:  [facility]  Evaluation Performed: Follow-up visit  PCP:  Sharilyn Sites, MD   Electrophysiologist:  Dr Rayann Heman  Chief Complaint:  palpitations  History of Present Illness:    Sean Morales is a 59 y.o. male who presents via telehealth conferencing today.  Since last being seen in our clinic, the patient reports doing very well.  Today, he denies symptoms of palpitations, chest pain, shortness of breath,  lower extremity edema, dizziness, presyncope, or syncope.  The patient is otherwise without complaint today.  The patient denies symptoms of fevers, chills, cough, or new SOB worrisome for COVID 19.  Past Medical History:  Diagnosis Date  . Bilateral knee pain   . Depression    no current meds  . DJD (degenerative joint disease)   . First degree heart block   . GERD (gastroesophageal reflux disease)   . Hypertension   . OA (osteoarthritis) of knee    bilateral  . Obstructive sleep apnea   . Persistent atrial fibrillation ([place]) first dx 10/ 2015-- previous primary cardiologist, [name]   followed by atrial fib. clinic-- [name] NP    Past Surgical History:  Procedure Laterality Date  . APPENDECTOMY  age 80  . ATRIAL FIBRILLATION ABLATION N/A 12/09/2018   Procedure: ATRIAL FIBRILLATION ABLATION;  Surgeon: Thompson Grayer, MD;  Location: [place] [facility];  Service: Cardiovascular;  Laterality: N/A;  . CARDIOVASCULAR STRESS TEST  01-17-2015  [name]   Low risk nuclear study w/ no ischemia/  normal LV function and wal motion,  nuclear stress ef  60%  . CARDIOVERSION N/A 01/20/2015   Procedure: CARDIOVERSION;  Surgeon: [name], MD;  [facility];  Service: Cardiovascular;  Laterality: N/A;  . CARDIOVERSION N/A 06/03/2018   Procedure: CARDIOVERSION;  Surgeon: [name], MD;  Location: [facility] END[facility];  Service: Cardiovascular;  Laterality: N/A;  . COLONOSCOPY N/A 09/20/2017   Procedure: COLONOSCOPY;  Surgeon: [name], MD;  [facility];  Service: Endoscopy;  Laterality: N/A;  1:30pm  . colonscopy  age 63  . cysts removed from nipples  in high school   and neck  . ORIF RIGHT KNEE  age 79  . POLYPECTOMY  09/20/2017   Procedure: POLYPECTOMY;  Surgeon: [name], MD;  [facility];  Service: Endoscopy;;  . TONSILLECTOMY  age 39  . TOTAL KNEE ARTHROPLASTY Bilateral 05/20/2017   Procedure: TOTAL KNEE BILATERAL;  Surgeon: [name], MD;  Location: [facility];  Service: Orthopedics;  Laterality: Bilateral;  Bilateral Adductor Block  . TRANSTHORACIC ECHOCARDIOGRAM  03/29/2017   ef 60-65%/  trivial AR, MR, and TR/  mild LAE/ moderate RAE/  PASP 26mmHg    Current Outpatient Medications  Medication Sig Dispense Refill  . acetaminophen (TYLENOL) 500 MG tablet Take 1,000 mg by mouth as needed for moderate pain.     Marland Kitchen amLODipine (NORVASC) 10 MG tablet Take 1 tablet (10 mg total) by mouth every evening. 30 tablet 6  . amoxicillin (AMOXIL) 500 MG capsule TAKE 4 CAPSULES BY MOUTH 1 HOUR PRIOR  TO APPOINTMENT    . dofetilide (TIKOSYN) 500 MCG capsule TAKE 1 CAPSULE BY MOUTH TWICE A DAY 60 capsule 5  . ibuprofen (ADVIL,MOTRIN) 200 MG tablet Take 400-800 mg by mouth as needed for headache or moderate pain.     Marland Kitchen losartan (COZAAR) 100 MG tablet TAKE 1 TABLET BY MOUTH EVERY DAY 90 tablet 2  . Magnesium 250 MG TABS Take 250 mg by mouth daily.     . metoprolol tartrate (LOPRESSOR) 25 MG tablet TAKE 1 TABLET BY MOUTH TWICE A DAY 180 tablet 2  . omeprazole (PRILOSEC) 40 MG capsule Take 40 mg by mouth daily.      Alveda Reasons 20 MG TABS tablet TAKE 1 TABLET (20 MG TOTAL) BY MOUTH DAILY WITH SUPPER. 90 tablet 3   No current facility-administered medications for this visit.    Allergies:   Bee venom, Other, Penicillins, Sulfa antibiotics, and Tetracyclines & related   Social History:  The patient  reports that he has been smoking cigarettes. He started smoking about 42 years ago. He has a 40.00 pack-year smoking history. He has never used smokeless tobacco. He reports current alcohol use of about 1.0 - 2.0 standard drinks of alcohol per week. He reports that he does not use drugs.   ROS:  Please see the history of present illness.   All other systems are personally reviewed and negative.    Exam:    Vital Signs:  There were no vitals taken for this visit.  Well sounding and appearing, alert and conversant, regular work of breathing,  good skin color Eyes- anicteric, neuro- grossly intact, skin- no apparent rash or lesions or cyanosis, mouth- oral mucosa is pink  Labs/Other Tests and Data Reviewed:    Recent Labs: 05/05/2019: BUN 15; Creatinine, Ser 0.96; Hemoglobin 15.9; Magnesium 2.2; Platelets 242; Potassium 4.4; Sodium 142; TSH 1.684   Wt Readings from Last 3 Encounters:  05/05/19 (!) 313 lb (142 kg)  03/12/19 (!) 312 lb 12.8 oz (141.9 kg)  01/07/19 (!) 315 lb 3.2 oz (143 kg)     5 day ZIO monitor is reviewed.  This reveals sinus rhythm.  Two episode of SVT lasting about 2 minutes are noted.  Mobitz I second degree AV block is also observed.   ASSESSMENT & PLAN:    1.  Persistent atrial fibrillation  Doing very well post ablation He is on tikosyn.  We discussed the importance of close monitoring of this medicine to avoid toxicity.  Ekg, labs reviewed from 05/05/19 We will have him follow-up in the AF clinic in 3 months Recent SVT is noted on his ZIo (personally reviewed today).  This is likely atach/ atypical atrial flutter.  I think that we should continue tikosyn for now.  We could  consider stopping this on return.  Ultimately, I worry that without substantial weight loss/ lifestyle change that he will have further atrial arrhythmias He would prefer to continue xarelto long term given prior FH of stroke.  2. OSA Compliance with CPAP advised  3. HTN Stable No change required today  4. Obesity As above  5. Mobitz I second degree AV block Noted on his event monitor We could consider reducing metoprolol in the future  Follow-up:  3 months in AF clinic   Patient Risk:  after full review of this patients clinical status, I feel that they are at high risk at this time.  Today, I have spent 15 minutes with the patient with telehealth technology discussing arrhythmia management .  Army Fossa, MD  06/17/2019 4:00 PM     [place] [address] [id number] [phone] (office) [phone] (fax)

## 2019-06-20 ENCOUNTER — Ambulatory Visit: Payer: BC Managed Care – PPO | Attending: Internal Medicine

## 2019-06-20 DIAGNOSIS — Z23 Encounter for immunization: Secondary | ICD-10-CM

## 2019-06-20 NOTE — Progress Notes (Signed)
   Covid-19 Vaccination Clinic  Name:  Sean Morales    MRN: [id number] DOB: [birth year]  06/20/2019  Sean Morales was observed post Covid-19 immunization for 15 minutes without incident. He was provided with Vaccine Information Sheet and instruction to access the V-Safe system.   Sean Morales was instructed to call 911 with any severe reactions post vaccine: Marland Kitchen Difficulty breathing  . Swelling of face and throat  . A fast heartbeat  . A bad rash all over body  . Dizziness and weakness   Immunizations Administered    Name Date Dose VIS Date Route   Moderna COVID-19 Vaccine 06/20/2019  8:42 AM 0.5 mL 03/03/2019 Intramuscular   Manufacturer: Moderna   Lot: [id number]   [id number]

## 2019-07-15 DIAGNOSIS — G4733 Obstructive sleep apnea (adult) (pediatric): Secondary | ICD-10-CM | POA: Diagnosis not present

## 2019-07-15 DIAGNOSIS — I4891 Unspecified atrial fibrillation: Secondary | ICD-10-CM | POA: Diagnosis not present

## 2019-07-15 DIAGNOSIS — E782 Mixed hyperlipidemia: Secondary | ICD-10-CM | POA: Diagnosis not present

## 2019-07-15 DIAGNOSIS — M179 Osteoarthritis of knee, unspecified: Secondary | ICD-10-CM | POA: Diagnosis not present

## 2019-07-15 DIAGNOSIS — Z6841 Body Mass Index (BMI) 40.0 and over, adult: Secondary | ICD-10-CM | POA: Diagnosis not present

## 2019-07-15 DIAGNOSIS — I10 Essential (primary) hypertension: Secondary | ICD-10-CM | POA: Diagnosis not present

## 2019-07-15 DIAGNOSIS — Z1389 Encounter for screening for other disorder: Secondary | ICD-10-CM | POA: Diagnosis not present

## 2019-07-22 ENCOUNTER — Ambulatory Visit: Payer: BC Managed Care – PPO | Attending: Internal Medicine

## 2019-07-22 DIAGNOSIS — Z23 Encounter for immunization: Secondary | ICD-10-CM

## 2019-07-22 NOTE — Progress Notes (Signed)
   Covid-19 Vaccination Clinic  Name:  SIRIS GRAUS    MRN: [id number] DOB: [birth year]  07/22/2019  Mr. Hillhouse was observed post Covid-19 immunization for 15 minutes without incident. He was provided with Vaccine Information Sheet and instruction to access the V-Safe system.   Mr. Marcinko was instructed to call 911 with any severe reactions post vaccine: Marland Kitchen Difficulty breathing  . Swelling of face and throat  . A fast heartbeat  . A bad rash all over body  . Dizziness and weakness   Immunizations Administered    Name Date Dose VIS Date Route   Moderna COVID-19 Vaccine 07/22/2019  3:35 PM 0.5 mL 03/2019 Intramuscular   Manufacturer: Moderna   Lot: [id number]   [id number]

## 2019-09-15 ENCOUNTER — Other Ambulatory Visit (HOSPITAL_COMMUNITY): Payer: Self-pay | Admitting: Nurse Practitioner

## 2019-11-05 ENCOUNTER — Other Ambulatory Visit (HOSPITAL_COMMUNITY): Payer: Self-pay | Admitting: *Deleted

## 2019-11-05 MED ORDER — DOFETILIDE 500 MCG PO CAPS: 500.0000 ug | ORAL_CAPSULE | Freq: Two times a day (BID) | ORAL | 2 refills | Status: DC

## 2019-11-19 DIAGNOSIS — Z1283 Encounter for screening for malignant neoplasm of skin: Secondary | ICD-10-CM | POA: Diagnosis not present

## 2019-11-19 DIAGNOSIS — L820 Inflamed seborrheic keratosis: Secondary | ICD-10-CM | POA: Diagnosis not present

## 2019-11-19 DIAGNOSIS — L720 Epidermal cyst: Secondary | ICD-10-CM | POA: Diagnosis not present

## 2019-11-19 DIAGNOSIS — L821 Other seborrheic keratosis: Secondary | ICD-10-CM | POA: Diagnosis not present

## 2019-11-23 ENCOUNTER — Ambulatory Visit (HOSPITAL_COMMUNITY)
Admission: RE | Admit: 2019-11-23 | Discharge: 2019-11-23 | Disposition: A | Discharge status: Home / Self Care | Payer: BC Managed Care – PPO | Source: Ambulatory Visit | Attending: Nurse Practitioner | Admitting: Nurse Practitioner

## 2019-11-23 ENCOUNTER — Encounter (HOSPITAL_COMMUNITY): Payer: Self-pay | Admitting: Nurse Practitioner

## 2019-11-23 ENCOUNTER — Other Ambulatory Visit: Payer: Self-pay

## 2019-11-23 VITALS — BP 138/94 | HR 77 | Ht 72.0 in | Wt 309.0 lb

## 2019-11-23 DIAGNOSIS — Z791 Long term (current) use of non-steroidal anti-inflammatories (NSAID): Secondary | ICD-10-CM | POA: Insufficient documentation

## 2019-11-23 DIAGNOSIS — Z8249 Family history of ischemic heart disease and other diseases of the circulatory system: Secondary | ICD-10-CM | POA: Diagnosis not present

## 2019-11-23 DIAGNOSIS — I4892 Unspecified atrial flutter: Secondary | ICD-10-CM | POA: Diagnosis not present

## 2019-11-23 DIAGNOSIS — Z79899 Other long term (current) drug therapy: Secondary | ICD-10-CM | POA: Insufficient documentation

## 2019-11-23 DIAGNOSIS — I440 Atrioventricular block, first degree: Secondary | ICD-10-CM | POA: Diagnosis not present

## 2019-11-23 DIAGNOSIS — Z7901 Long term (current) use of anticoagulants: Secondary | ICD-10-CM | POA: Diagnosis not present

## 2019-11-23 DIAGNOSIS — M170 Bilateral primary osteoarthritis of knee: Secondary | ICD-10-CM | POA: Insufficient documentation

## 2019-11-23 DIAGNOSIS — F17210 Nicotine dependence, cigarettes, uncomplicated: Secondary | ICD-10-CM | POA: Diagnosis not present

## 2019-11-23 DIAGNOSIS — Z880 Allergy status to penicillin: Secondary | ICD-10-CM | POA: Insufficient documentation

## 2019-11-23 DIAGNOSIS — Z881 Allergy status to other antibiotic agents status: Secondary | ICD-10-CM | POA: Diagnosis not present

## 2019-11-23 DIAGNOSIS — Z9989 Dependence on other enabling machines and devices: Secondary | ICD-10-CM | POA: Diagnosis not present

## 2019-11-23 DIAGNOSIS — D6869 Other thrombophilia: Secondary | ICD-10-CM | POA: Diagnosis not present

## 2019-11-23 DIAGNOSIS — I4819 Other persistent atrial fibrillation: Secondary | ICD-10-CM | POA: Diagnosis not present

## 2019-11-23 DIAGNOSIS — K219 Gastro-esophageal reflux disease without esophagitis: Secondary | ICD-10-CM | POA: Diagnosis not present

## 2019-11-23 DIAGNOSIS — Z792 Long term (current) use of antibiotics: Secondary | ICD-10-CM | POA: Diagnosis not present

## 2019-11-23 DIAGNOSIS — I10 Essential (primary) hypertension: Secondary | ICD-10-CM | POA: Insufficient documentation

## 2019-11-23 DIAGNOSIS — Z96653 Presence of artificial knee joint, bilateral: Secondary | ICD-10-CM | POA: Diagnosis not present

## 2019-11-23 DIAGNOSIS — G4733 Obstructive sleep apnea (adult) (pediatric): Secondary | ICD-10-CM | POA: Diagnosis not present

## 2019-11-23 DIAGNOSIS — F329 Major depressive disorder, single episode, unspecified: Secondary | ICD-10-CM | POA: Diagnosis not present

## 2019-11-23 DIAGNOSIS — Z888 Allergy status to other drugs, medicaments and biological substances status: Secondary | ICD-10-CM | POA: Insufficient documentation

## 2019-11-23 DIAGNOSIS — Z9049 Acquired absence of other specified parts of digestive tract: Secondary | ICD-10-CM | POA: Insufficient documentation

## 2019-11-23 DIAGNOSIS — I4891 Unspecified atrial fibrillation: Secondary | ICD-10-CM

## 2019-11-23 LAB — BASIC METABOLIC PANEL
Anion gap: 9 (ref 5–15)
BUN: 12 mg/dL (ref 6–20)
CO2: 26 mmol/L (ref 22–32)
Calcium: 8.9 mg/dL (ref 8.9–10.3)
Chloride: 105 mmol/L (ref 98–111)
Creatinine, Ser: 1.07 mg/dL (ref 0.61–1.24)
GFR calc Af Amer: >60 mL/min (ref >60)
GFR calc non Af Amer: >60 mL/min (ref >60)
Glucose, Bld: 94 mg/dL (ref 70–99)
Potassium: 4.3 mmol/L (ref 3.5–5.1)
Sodium: 140 mmol/L (ref 135–145)

## 2019-11-23 LAB — MAGNESIUM: Magnesium: 2.1 mg/dL (ref 1.7–2.4)

## 2019-11-23 NOTE — Progress Notes (Signed)
Patient ID: Sean Morales, male   DOB: 11/05/1960, 59 y.o.   MRN: [id number]     Primary Care Physician: Sharilyn Sites, MD Referring Physician:  Dr. Rayann Heman EP: Dr. Merlene Morse is a 59 y.o. male with a h/o persistent afib on tikosyn therapy/xarelto. He has done well since 2016, when loaded on Tikosyn   until the last several weeks when he went back into afib/flutter. He had amiodarone in late 2015 and failed cardioversion. He had been persistent afib for a year at that time. He was seen by Dr. Rayann Heman 05/2014 who initiated tikosyn.   He returned to the clinic  for f/u cardioversion 06/03/18.Marland Kitchen He had SR for about 10 mins and then returned to afib. Other options discussed and he wanted to proceed with ablation. . Is very religious to wear cpap for treatment of OSA. Struggles with weight loss. Symptomatic with fatigue and shortness of breath in afib.  F/u in afib clinic one month past ablation. He has been staying in [place]. He did have bruising at the groin   that has resolved. Sore throat right after ablation that has also resolved. He continues to smoke 1 pack a day. He has gained 10 lbs.  He is trying to walk 5000 steps daily but has felt that he is still winded with exertion. He felt after ablation that he would feel better very quickly Today, I discussed Coumadin as well as novel anticoagulants including Pradaxa, Xarelto, Savaysa, and Eliquis today as indicated for risk reduction in stroke and systemic emboli with nonvalvular atrial fibrillation.  Risks, benefits, and alternatives to each of these drugs were discussed at length today. PND/orthopnea.  F/u in afib clinic, 05/05/19. He asked to be seen this am as he has not felt well for around 2-3 weeks. He has noted headaches/ extreme fatgiue/ fluttering of the heart that would only last minutes. His  symptoms have improved but linger. He  was exposed to covid 03/30/19 from his adult daughter. 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 He  did not notice any sinus issues or loss of smell/taste. He is SR today. Weight around the same. BP elevated this am as he did not take his am meds yet. Continues on xarelto 20 mg daily for at least 1.   F/u in afib clinic, 8/23. He has not noted any afib. He remains on dofetilide after his ablation 12/09/18. He has had both covid shots. He has not noted any bleeding on xarelto for a CHA2DS2VASc score of 1. He wishes to stay on anticoagulation for family history of stroke and tikosyn, as he has stressful issues going on with the sale of a house and does not want to risk going into afib at this time.   Today, he denies symptoms  of shortness of breath, orthopnea, PND, lower extremity edema, dizziness, presyncope, syncope, or neurologic sequela.Positive of symptoms as  listed above.The patient is tolerating medications without difficulties and is otherwise without complaint today.   Past Medical History:  Diagnosis Date  . Bilateral knee pain   . Depression    no current meds  . DJD (degenerative joint disease)   . First degree heart block   . GERD (gastroesophageal reflux disease)   . Hypertension   . OA (osteoarthritis) of knee    bilateral  . Obstructive sleep apnea   . Persistent  atrial fibrillation ([place]) first dx 10/ 2015-- previous primary cardiologist, [name]   followed by atrial fib. clinic-- [name] NP   Past Surgical History:  Procedure Laterality Date  . APPENDECTOMY  age 73  . ATRIAL FIBRILLATION ABLATION N/A 12/09/2018   Procedure: ATRIAL FIBRILLATION ABLATION;  Surgeon: Thompson Grayer, MD;  Location: [place] [facility];  Service: Cardiovascular;  Laterality: N/A;  . CARDIOVASCULAR STRESS TEST  01-17-2015  [name]   Low risk nuclear study w/ no ischemia/  normal LV function and wal motion,  nuclear stress ef 60%  . CARDIOVERSION N/A 01/20/2015   Procedure: CARDIOVERSION;  Surgeon: [name], MD;  Location: [facility];  Service: Cardiovascular;  Laterality: N/A;  . CARDIOVERSION N/A 06/03/2018   Procedure: CARDIOVERSION;  Surgeon: [name], MD;  Location: [facility] ENDOSCOPY;  Service: Cardiovascular;  Laterality: N/A;  . COLONOSCOPY N/A 09/20/2017   Procedure: COLONOSCOPY;  Surgeon: [name], MD;  [facility];  Service: Endoscopy;  Laterality: N/A;  1:30pm  . colonscopy  age 89  . cysts removed from nipples  in high school   and neck  . ORIF RIGHT KNEE  age 45  . POLYPECTOMY  09/20/2017   Procedure: POLYPECTOMY;  Surgeon: [name], MD;  [facility];  Service: Endoscopy;;  . TONSILLECTOMY  age 16  . TOTAL KNEE ARTHROPLASTY Bilateral 05/20/2017   Procedure: TOTAL KNEE BILATERAL;  Surgeon: [name], MD;  [facility];  Service: Orthopedics;  Laterality: Bilateral;  Bilateral Adductor Block  . TRANSTHORACIC ECHOCARDIOGRAM  03/29/2017   ef 60-65%/  trivial AR, MR, and TR/  mild LAE/ moderate RAE/  PASP 67mmHg    Current Outpatient Medications  Medication Sig Dispense Refill  . acetaminophen (TYLENOL) 500 MG tablet Take 1,000 mg by mouth as needed for moderate pain.     Marland Kitchen amLODipine (NORVASC) 10 MG tablet TAKE 1 TABLET BY MOUTH EVERY DAY IN THE EVENING 90 tablet 2  . amoxicillin (AMOXIL) 500 MG capsule TAKE 4 CAPSULES BY MOUTH 1 HOUR PRIOR TO APPOINTMENT    . dofetilide (TIKOSYN) 500 MCG capsule Take 1 capsule (500 mcg total) by mouth 2 (two) times daily. 60 capsule 2  . ibuprofen (ADVIL,MOTRIN) 200 MG tablet Take 400-800 mg by mouth as needed for headache or moderate pain.     Marland Kitchen losartan (COZAAR) 100 MG tablet TAKE 1 TABLET BY MOUTH EVERY DAY 90 tablet 2  . Magnesium 250 MG TABS Take 250 mg by mouth daily.     . metoprolol tartrate (LOPRESSOR) 25 MG tablet TAKE 1 TABLET BY MOUTH TWICE A DAY 180 tablet 2  . omeprazole (PRILOSEC) 40 MG capsule Take 40 mg by mouth daily.     Alveda Reasons 20 MG TABS tablet TAKE 1 TABLET (20 MG TOTAL) BY  MOUTH DAILY WITH SUPPER. 90 tablet 3   No current facility-administered medications for this encounter.    Allergies  Allergen Reactions  . Bee Venom Anaphylaxis    [place] Hornet  . Other     Co pyronil per patient not sure  . Penicillins Other (See Comments)    Unknown. Childhood allergy. Has patient had a PCN reaction causing immediate rash, facial/tongue/throat swelling, SOB or lightheadedness with hypotension: Unknown Has patient had a PCN reaction causing severe rash involving mucus membranes or skin necrosis: Unknown Has patient had a PCN reaction that required hospitalization: Unknown Has patient had a PCN reaction occurring within the last 10 years: No  If all of the above answers are "NO", then may proceed with Cephalosporin use.   . Sulfa Antibiotics Other (See Comments)    Unknown. Childhood allergy.  . Tetracyclines & Related Other (See Comments)    Unknown. Childhood allergy.    Social History   Socioeconomic History  . Marital status: Married    Spouse name: [place]  . Number of children: 4  . Years of education: 52  . Highest education level: Not on file  Occupational History    Comment: Transport planner [employer]  Tobacco Use  . Smoking status: Current Every Day Smoker    Packs/day: 1.00    Years: 40.00    Pack years: 40.00    Types: Cigarettes    Start date: 01/08/1977  . Smokeless tobacco: Never Used  Vaping Use  . Vaping Use: Never used  Substance and Sexual Activity  . Alcohol use: Yes    Alcohol/week: 1.0 - 2.0 standard drink    Types: 1 - 2 Cans of beer per week    Comment: rarely  . Drug use: No  . Sexual activity: Yes  Other Topics Concern  . Not on file  Social History Narrative   Lives in [place] (near [place] with wife.   Patient 1 cup of coffee daily,occas. 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 Difficulty of  Paying Living Expenses: Not on file  Food Insecurity:   . Worried About Charity fundraiser in the Last Year: Not on file  . Ran Out of Food in the Last Year: Not on file  Transportation Needs:   . Lack of Transportation (Medical): Not on file  . Lack of Transportation (Non-Medical): Not on file  Physical Activity:   . Days of Exercise per Week: Not on file  . Minutes of Exercise per Session: Not on file  Stress:   . Feeling of Stress : Not on file  Social Connections:   . Frequency of Communication with Friends and Family: Not on file  . Frequency of Social Gatherings with Friends and Family: Not on file  . Attends Religious Services: Not on file  . Active Member of Clubs or Organizations: Not on file  . Attends Archivist Meetings: Not on file  . Marital Status: Not on file  Intimate Partner Violence:   . Fear of Current or Ex-Partner: Not on file  . Emotionally Abused: Not on file  . Physically Abused: Not on file  . Sexually Abused: Not on file    Family History  Problem Relation Age of Onset  . Deep vein thrombosis Father   . Stroke Mother   . Colon cancer Neg Hx     ROS- All systems are reviewed and negative except as per the HPI above  Physical Exam: Vitals:   11/23/19 1419  BP: (!) 138/94  Pulse: 77  Weight: (!) 140.2 kg  Height: 6' (1.829 m)  Pulse Ox 97%  GEN- The patient is well appearing, alert and oriented x 3 today.   Head- normocephalic, atraumatic Eyes-  Sclera clear, conjunctiva pink Ears- hearing intact Oropharynx- clear Neck- supple, no JVP Lymph- no cervical lymphadenopathy Lungs- Clear to ausculation bilaterally, normal work of breathing Heart- regular rate and rhythm, no murmurs, rubs or gallops, PMI not laterally displaced GI- soft, NT, ND, + BS Extremities- no clubbing, cyanosis, or 2+ edema  MS- no significant deformity or atrophy Skin- no rash or lesion Psych- euthymic mood, full affect Neuro- strength and sensation are  intact  EKG- sinus rhythm with first degree AV block, pr int 304 ms, qrs int 84 ms, qtc 458 ms Echo- 2018-Study Conclusions  - Left ventricle: The cavity size was normal. Wall thickness was   normal. Systolic function was normal. The estimated ejection   fraction was in the range of 60% to 65%. Mildly reduced GLS at   -16%. Wall motion was normal; there were no regional wall motion   abnormalities. Left ventricular diastolic function parameters   were normal. - Aortic valve: Trileaflet. Sclerosis without stenosis. There was   trivial regurgitation. - Mitral valve: Mildly thickened leaflets . There was trivial   regurgitation. - Left atrium: The atrium was mildly dilated. - Right atrium: Moderately dilated. - Tricuspid valve: There was trivial regurgitation. - Pulmonary arteries: PA peak pressure: 33 mm Hg (S). - Inferior vena cava: The vessel was dilated. The respirophasic   diameter changes were blunted (< 50%), consistent with elevated   central venous pressure.  Impressions:  - Compared to a prior study in 2015, the LVEF is higher at 83-09% -   diastolic function is normal. There is moderate RAE and mild LAE.  Assessment and Plan: 1. Persistent afib/flutter S/p ablation 12/2018 Doing well staying in SR  Continue tikosyn 500 mcg bid, he wishes to stay on antiarrythmic  Continue metoprolol 25 mg bid  Continue xarelto for chadsvasc score of at least 1  He wishes to stay on DOAC because of family history    2. HTN Stable   F/u with afib clinic in 6 months   Butch Penny C. Callie Facey, [facility] [address] [place], [place] [address] [phone]

## 2019-12-14 ENCOUNTER — Other Ambulatory Visit (HOSPITAL_COMMUNITY): Payer: Self-pay | Admitting: *Deleted

## 2019-12-14 MED ORDER — METOPROLOL TARTRATE 25 MG PO TABS: 25.0000 mg | ORAL_TABLET | Freq: Two times a day (BID) | ORAL | 2 refills | Status: DC

## 2020-02-03 DIAGNOSIS — L720 Epidermal cyst: Secondary | ICD-10-CM | POA: Diagnosis not present

## 2020-02-03 DIAGNOSIS — L918 Other hypertrophic disorders of the skin: Secondary | ICD-10-CM | POA: Diagnosis not present

## 2020-02-13 DIAGNOSIS — Z91030 Bee allergy status: Secondary | ICD-10-CM | POA: Diagnosis not present

## 2020-02-13 DIAGNOSIS — Z880 Allergy status to penicillin: Secondary | ICD-10-CM | POA: Diagnosis not present

## 2020-02-13 DIAGNOSIS — Z888 Allergy status to other drugs, medicaments and biological substances status: Secondary | ICD-10-CM | POA: Diagnosis not present

## 2020-02-13 DIAGNOSIS — K029 Dental caries, unspecified: Secondary | ICD-10-CM | POA: Diagnosis not present

## 2020-02-13 DIAGNOSIS — I10 Essential (primary) hypertension: Secondary | ICD-10-CM | POA: Diagnosis not present

## 2020-02-13 DIAGNOSIS — Z8679 Personal history of other diseases of the circulatory system: Secondary | ICD-10-CM | POA: Diagnosis not present

## 2020-02-13 DIAGNOSIS — K219 Gastro-esophageal reflux disease without esophagitis: Secondary | ICD-10-CM | POA: Diagnosis not present

## 2020-02-13 DIAGNOSIS — F17200 Nicotine dependence, unspecified, uncomplicated: Secondary | ICD-10-CM | POA: Diagnosis not present

## 2020-02-13 DIAGNOSIS — Z882 Allergy status to sulfonamides status: Secondary | ICD-10-CM | POA: Diagnosis not present

## 2020-02-13 DIAGNOSIS — K047 Periapical abscess without sinus: Secondary | ICD-10-CM | POA: Diagnosis not present

## 2020-02-13 DIAGNOSIS — K0889 Other specified disorders of teeth and supporting structures: Secondary | ICD-10-CM | POA: Diagnosis not present

## 2020-02-21 ENCOUNTER — Other Ambulatory Visit: Payer: Self-pay | Admitting: Internal Medicine

## 2020-02-22 NOTE — Telephone Encounter (Signed)
Pt last saw Roderic Palau, NP on 11/23/19, last labs 11/23/19 Creat 1.07, age 60, weight 140.2kg, CrCl 147.41, based on CrCl pt is on appropriate dosage of Xarelto 20mg  QD.  Will refill rx.

## 2020-03-23 ENCOUNTER — Telehealth (HOSPITAL_COMMUNITY): Payer: Self-pay

## 2020-03-23 NOTE — Telephone Encounter (Signed)
Patient PA for Tikosyn approve from 03/22/2020-03/22/2021 [facility].

## 2020-04-11 ENCOUNTER — Other Ambulatory Visit: Payer: Self-pay

## 2020-04-11 ENCOUNTER — Encounter (HOSPITAL_COMMUNITY): Payer: Self-pay | Admitting: Physician Assistant

## 2020-04-11 ENCOUNTER — Ambulatory Visit (HOSPITAL_COMMUNITY)
Admission: RE | Admit: 2020-04-11 | Discharge: 2020-04-11 | Disposition: A | Discharge status: Home / Self Care | Payer: BC Managed Care – PPO | Source: Ambulatory Visit | Attending: Physician Assistant | Admitting: Physician Assistant

## 2020-04-11 VITALS — BP 142/90 | HR 73 | Ht 72.0 in | Wt 308.0 lb

## 2020-04-11 DIAGNOSIS — E669 Obesity, unspecified: Secondary | ICD-10-CM | POA: Diagnosis not present

## 2020-04-11 DIAGNOSIS — I498 Other specified cardiac arrhythmias: Secondary | ICD-10-CM | POA: Insufficient documentation

## 2020-04-11 DIAGNOSIS — Z79899 Other long term (current) drug therapy: Secondary | ICD-10-CM | POA: Insufficient documentation

## 2020-04-11 DIAGNOSIS — I4819 Other persistent atrial fibrillation: Secondary | ICD-10-CM | POA: Diagnosis not present

## 2020-04-11 DIAGNOSIS — Z888 Allergy status to other drugs, medicaments and biological substances status: Secondary | ICD-10-CM | POA: Diagnosis not present

## 2020-04-11 DIAGNOSIS — I441 Atrioventricular block, second degree: Secondary | ICD-10-CM | POA: Diagnosis not present

## 2020-04-11 DIAGNOSIS — Z6841 Body Mass Index (BMI) 40.0 and over, adult: Secondary | ICD-10-CM | POA: Insufficient documentation

## 2020-04-11 DIAGNOSIS — F17210 Nicotine dependence, cigarettes, uncomplicated: Secondary | ICD-10-CM | POA: Diagnosis not present

## 2020-04-11 DIAGNOSIS — G4733 Obstructive sleep apnea (adult) (pediatric): Secondary | ICD-10-CM | POA: Diagnosis not present

## 2020-04-11 DIAGNOSIS — Z880 Allergy status to penicillin: Secondary | ICD-10-CM | POA: Diagnosis not present

## 2020-04-11 DIAGNOSIS — Z882 Allergy status to sulfonamides status: Secondary | ICD-10-CM | POA: Insufficient documentation

## 2020-04-11 DIAGNOSIS — I10 Essential (primary) hypertension: Secondary | ICD-10-CM | POA: Insufficient documentation

## 2020-04-11 DIAGNOSIS — Z7901 Long term (current) use of anticoagulants: Secondary | ICD-10-CM | POA: Insufficient documentation

## 2020-04-11 LAB — BASIC METABOLIC PANEL
Anion gap: 10 (ref 5–15)
BUN: 13 mg/dL (ref 6–20)
CO2: 25 mmol/L (ref 22–32)
Calcium: 8.7 mg/dL — ABNORMAL LOW (ref 8.9–10.3)
Chloride: 102 mmol/L (ref 98–111)
Creatinine, Ser: 0.97 mg/dL (ref 0.61–1.24)
GFR, Estimated: >60 mL/min (ref >60)
Glucose, Bld: 82 mg/dL (ref 70–99)
Potassium: 4 mmol/L (ref 3.5–5.1)
Sodium: 137 mmol/L (ref 135–145)

## 2020-04-11 LAB — MAGNESIUM: Magnesium: 2.3 mg/dL (ref 1.7–2.4)

## 2020-04-11 NOTE — Progress Notes (Signed)
Primary Care Physician: Sharilyn Sites, MD Referring Physician:  Dr. Rayann Heman EP: Dr. Merlene Morse is a 60 y.o. male with a h/o persistent afib on tikosyn therapy/xarelto. He has done well since 2016, when loaded on Tikosyn   until the last several weeks when he went back into afib/flutter. He had amiodarone in late 2015 and failed cardioversion. He had been persistent afib for a year at that time. He was seen by Dr. Rayann Heman 05/2014 who initiated tikosyn.   He returned to the clinic  for f/u cardioversion 06/03/18.Sean Morales He had SR for about 10 mins and then returned to afib. Other options discussed and he wanted to proceed with ablation. . Is very religious to wear cpap for treatment of OSA. Struggles with weight loss. Symptomatic with fatigue and shortness of breath in afib.  F/u in afib clinic one month past ablation. He has been staying in [place]. He did have bruising at the groin   that has resolved. Sore throat right after ablation that has also resolved. He continues to smoke 1 pack a day. He has gained 10 lbs.  He is trying to walk 5000 steps daily but has felt that he is still winded with exertion. He felt after ablation that he would feel better very quickly Today, I discussed Coumadin as well as novel anticoagulants including Pradaxa, Xarelto, Savaysa, and Eliquis today as indicated for risk reduction in stroke and systemic emboli with nonvalvular atrial fibrillation.  Risks, benefits, and alternatives to each of these drugs were discussed at length today. PND/orthopnea.  F/u in afib clinic, 05/05/19. He asked to be seen this am as he has not felt well for around 2-3 weeks. He has noted headaches/ extreme fatgiue/ fluttering of the heart that would only last minutes. His  symptoms have improved but linger. He  was exposed to covid 03/30/19 from his adult daughter. 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 He  did not  notice any sinus issues or loss of smell/taste. He is SR today. Weight around the same. BP elevated this am as he did not take his am meds yet. Continues on xarelto 20 mg daily for at least 1.   F/u in afib clinic, 8/23. He has not noted any afib. He remains on dofetilide after his ablation 12/09/18. He has had both covid shots. He has not noted any bleeding on xarelto for a CHA2DS2VASc score of 1. He wishes to stay on anticoagulation for family history of stroke and tikosyn, as he has stressful issues going on with the sale of a house and does not want to risk going into afib at this time.   Follow up in the AF clinic 04/12/19. Patient felt "jittery" over the last several days and was unable to get a BP reading on his home machine and was concerned he was back in afib. He is in SR today on ECG. He denies any bleeding issues on anticoagulation.   Today, he denies symptoms  of palpitatinos, shortness of breath, orthopnea, PND, lower extremity edema, dizziness, presyncope, syncope, or neurologic sequela.Positive of symptoms as  listed above.The patient is tolerating medications without difficulties and is otherwise without complaint today.   Past Medical History:  Diagnosis Date  . Bilateral knee pain   . Depression    no current meds  . DJD (degenerative joint disease)   .  First degree heart block   . GERD (gastroesophageal reflux disease)   . Hypertension   . OA (osteoarthritis) of knee    bilateral  . Obstructive sleep apnea   . Persistent atrial fibrillation ([place]) first dx 10/ 2015-- previous primary cardiologist, [name]   followed by atrial fib. clinic-- [name] NP   Past Surgical History:  Procedure Laterality Date  . APPENDECTOMY  age 89  . ATRIAL FIBRILLATION ABLATION N/A 12/09/2018   Procedure: ATRIAL FIBRILLATION ABLATION;  Surgeon: Thompson Grayer, MD;  Location: [facility];  Service: Cardiovascular;  Laterality: N/A;  . CARDIOVASCULAR STRESS TEST  01-17-2015  dr  Bronson Ing   Low risk nuclear study w/ no ischemia/  normal LV function and wal motion,  nuclear stress ef 60%  . CARDIOVERSION N/A 01/20/2015   Procedure: CARDIOVERSION;  Surgeon: [name], MD;  [facility]: [facility];  Service: Cardiovascular;  Laterality: N/A;  . CARDIOVERSION N/A 06/03/2018   Procedure: CARDIOVERSION;  Surgeon: [name], MD;  Location: [facility] ENDOSCOPY;  Service: Cardiovascular;  Laterality: N/A;  . COLONOSCOPY N/A 09/20/2017   Procedure: COLONOSCOPY;  Surgeon: [name], MD;  [facility]: [facility];  Service: Endoscopy;  Laterality: N/A;  1:30pm  . colonscopy  age 68  . cysts removed from nipples  in high school   and neck  . ORIF RIGHT KNEE  age 57  . POLYPECTOMY  09/20/2017   Procedure: POLYPECTOMY;  Surgeon: [name], MD;  [facility];  Service: Endoscopy;;  . TONSILLECTOMY  age 83  . TOTAL KNEE ARTHROPLASTY Bilateral 05/20/2017   Procedure: TOTAL KNEE BILATERAL;  Surgeon: [name], MD;  Location: [facility];  Service: Orthopedics;  Laterality: Bilateral;  Bilateral Adductor Block  . TRANSTHORACIC ECHOCARDIOGRAM  03/29/2017   ef 60-65%/  trivial AR, MR, and TR/  mild LAE/ moderate RAE/  PASP 42mmHg    Current Outpatient Medications  Medication Sig Dispense Refill  . acetaminophen (TYLENOL) 500 MG tablet Take 1,000 mg by mouth as needed for moderate pain.     Sean Morales amLODipine (NORVASC) 10 MG tablet TAKE 1 TABLET BY MOUTH EVERY DAY IN THE EVENING 90 tablet 2  . amoxicillin (AMOXIL) 500 MG capsule TAKE 4 CAPSULES BY MOUTH 1 HOUR PRIOR TO APPOINTMENT    . dofetilide (TIKOSYN) 500 MCG capsule Take 1 capsule (500 mcg total) by mouth 2 (two) times daily. 60 capsule 2  . ibuprofen (ADVIL,MOTRIN) 200 MG tablet Take 400-800 mg by mouth as needed for headache or moderate pain.     Sean Morales losartan (COZAAR) 100 MG tablet TAKE 1 TABLET BY MOUTH EVERY DAY 90 tablet 2  . Magnesium 250 MG TABS Take 250 mg by mouth daily.     . metoprolol tartrate (LOPRESSOR)  25 MG tablet Take 1 tablet (25 mg total) by mouth 2 (two) times daily. 180 tablet 2  . omeprazole (PRILOSEC) 40 MG capsule Take 40 mg by mouth daily.     Alveda Reasons 20 MG TABS tablet TAKE 1 TABLET (20 MG TOTAL) BY MOUTH DAILY WITH SUPPER. 90 tablet 1   No current facility-administered medications for this encounter.    Allergies  Allergen Reactions  . Bee Venom Anaphylaxis    [place] Hornet  . Other     Co pyronil per patient not sure  . Penicillins Other (See Comments)    Unknown. Childhood allergy. Has patient had a PCN reaction causing immediate rash, facial/tongue/throat swelling, SOB or lightheadedness with hypotension: Unknown  Has patient had a PCN reaction causing severe rash involving mucus membranes or skin necrosis: Unknown Has patient had a PCN reaction that required hospitalization: Unknown Has patient had a PCN reaction occurring within the last 10 years: No If all of the above answers are "NO", then may proceed with Cephalosporin use.   . Sulfa Antibiotics Other (See Comments)    Unknown. Childhood allergy.  . Tetracyclines & Related Other (See Comments)    Unknown. Childhood allergy.    Social History   Socioeconomic History  . Marital status: Married    Spouse name: Benkelman  . Number of children: 4  . Years of education: 25  . Highest education level: Not on file  Occupational History    Comment: [employer]  Tobacco Use  . Smoking status: Current Every Day Smoker    Packs/day: 1.00    Years: 40.00    Pack years: 40.00    Types: Cigarettes    Start date: 01/08/1977  . Smokeless tobacco: Never Used  . Tobacco comment: 1 pack daily  Vaping Use  . Vaping Use: Never used  Substance and Sexual Activity  . Alcohol use: Yes    Alcohol/week: 1.0 - 2.0 standard drink    Types: 1 - 2 Cans of beer per week    Comment: rarely  . Drug use: No  . Sexual activity: Yes  Other Topics Concern  . Not on file  Social History Narrative   Lives in [place] (near [place]) with wife.   Patient 1 cup of coffee daily,occas. 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 Deep vein thrombosis Father   . Stroke Mother   . Colon cancer Neg Hx     ROS- All systems are reviewed and negative except as per the HPI above  Physical Exam: Vitals:   04/11/20 1540  BP: (!) 142/90  Pulse: 73  Weight: (!) 139.7 kg  Height: 6' (1.829 m)  Pulse Ox 97%  GEN- The patient is well appearing obese male, alert and oriented x 3 today.   HEENT-head normocephalic, atraumatic, sclera clear, conjunctiva pink, hearing intact, trachea midline. Lungs- Clear to ausculation bilaterally, normal work of breathing Heart- Regular rate and rhythm, no murmurs, rubs or gallops  GI- soft, NT, ND, + BS Extremities- no clubbing, cyanosis, or edema MS- no significant deformity or atrophy Skin- no rash or lesion Psych- euthymic mood, full affect Neuro- strength and sensation are intact   EKG- SR HR 73, 1st degree AV block, PR 310, QRS 86, QTc 445  Echo 08/05/18 1. The left ventricle has normal systolic function with an ejection  fraction of 60-65%. The cavity size was normal. Left ventricular diastolic  function could not be evaluated secondary to atrial fibrillation.  2. The right ventricle has normal systolic function. The cavity was  normal.  3. The mitral valve is grossly normal.  4. The tricuspid valve is grossly normal.  5. The aortic valve was not well visualized. Aortic valve regurgitation  is trivial by color flow Doppler. No stenosis of the aortic valve.  6. Normal LV systolic function; trace  AI.    Assessment and Plan: 1. Persistent afib/flutter S/p ablation 12/2018 He is in SR today. He is unsure if he is having episodes of afib. We discussed role of ILR and he is interested. Will refer back to Dr Rayann Heman for consideration.  Check bmet/mag Continue tikosyn 500 mcg BID. QT stable.  Continue metoprolol 25 mg bid  Continue xarelto for chadsvasc score of at least 1  Recall he wishes to stay on DOAC because of family history    2. HTN Stable, no changes today.  3. OSA The importance of adequate treatment of sleep apnea was discussed today in order to improve our ability to maintain sinus rhythm long term. Encouraged compliance with CPAP therapy.   4. Mobitz I second degree block/1st degree AV block Noted on monitor. Limits rate control.  5. Obesity Body mass index is 41.77 kg/m. Lifestyle modification was discussed and encouraged including regular physical activity and weight reduction.   Follow up with Dr Rayann Heman for consideration of ILR.   [facility] [address] [place], [place] [address] [phone]

## 2020-05-04 ENCOUNTER — Encounter: Payer: Self-pay | Admitting: Internal Medicine

## 2020-05-04 ENCOUNTER — Other Ambulatory Visit: Payer: Self-pay

## 2020-05-04 ENCOUNTER — Ambulatory Visit (INDEPENDENT_AMBULATORY_CARE_PROVIDER_SITE_OTHER): Payer: BC Managed Care – PPO | Admitting: Internal Medicine

## 2020-05-04 VITALS — BP 150/94 | HR 76 | Ht 72.0 in | Wt 312.2 lb

## 2020-05-04 DIAGNOSIS — I4819 Other persistent atrial fibrillation: Secondary | ICD-10-CM

## 2020-05-04 DIAGNOSIS — G4733 Obstructive sleep apnea (adult) (pediatric): Secondary | ICD-10-CM | POA: Diagnosis not present

## 2020-05-04 DIAGNOSIS — Z9989 Dependence on other enabling machines and devices: Secondary | ICD-10-CM | POA: Diagnosis not present

## 2020-05-04 DIAGNOSIS — I10 Essential (primary) hypertension: Secondary | ICD-10-CM

## 2020-05-04 HISTORY — PX: OTHER SURGICAL HISTORY: SHX169

## 2020-05-04 NOTE — Patient Instructions (Addendum)
Medication Instructions:  Stop your Metoprolol. - taper off by taking 1/2 tablet twice a day for 2 weeks, then stop Your physician recommends that you continue on your current medications as directed. Please refer to the Current Medication list given to you today.  Labwork: None ordered.  Testing/Procedures: None ordered.  Your physician wants you to follow-up in: 08/03/20 at 3 pm with Dr. Rayann Heman.    Implantable Loop Recorder Placement, Care After This sheet gives you information about how to care for yourself after your procedure. Your health care provider may also give you more specific instructions. If you have problems or questions, contact your health care provider. What can I expect after the procedure? After the procedure, it is common to have:  Soreness or discomfort near the incision.  Some swelling or bruising near the incision.  Follow these instructions at home: Incision care  1.  Leave your outer dressing on for 24 hours.  After 24 hours you can remove your outer dressing and shower. 2. Leave adhesive strips in place. These skin closures may need to stay in place for 1-2 weeks. If adhesive strip edges start to loosen and curl up, you may trim the loose edges.  You may remove the strips if they have not fallen off after 2 weeks. 3. Check your incision area every day for signs of infection. Check for: a. Redness, swelling, or pain. b. Fluid or blood. c. Warmth. d. Pus or a bad smell. 4. Do not take baths, swim, or use a hot tub until your incision is completely healed. 5. If your wound site starts to bleed apply pressure.      If you have any questions/concerns please call the device clinic at [phone].  Activity  Return to your normal activities.  General instructions  Follow instructions from your health care provider about how to manage your implantable loop recorder and transmit the information. Learn how to activate a recording if this is necessary for  your type of device.  Do not go through a metal detection gate, and do not let someone hold a metal detector over your chest. Show your ID card.  Do not have an MRI unless you check with your health care provider first.  Take over-the-counter and prescription medicines only as told by your health care provider.  Keep all follow-up visits as told by your health care provider. This is important. Contact a health care provider if:  You have redness, swelling, or pain around your incision.  You have a fever.  You have pain that is not relieved by your pain medicine.  You have triggered your device because of fainting (syncope) or because of a heartbeat that feels like it is racing, slow, fluttering, or skipping (palpitations). Get help right away if you have:  Chest pain.  Difficulty breathing. Summary  After the procedure, it is common to have soreness or discomfort near the incision.  Change your dressing as told by your health care provider.  Follow instructions from your health care provider about how to manage your implantable loop recorder and transmit the information.  Keep all follow-up visits as told by your health care provider. This is important. This information is not intended to replace advice given to you by your health care provider. Make sure you discuss any questions you have with your health care provider. Document Released: 02/28/2015 Document Revised: 05/04/2017 Document Reviewed: 05/04/2017 Elsevier Patient Education  2020 [employer].

## 2020-05-04 NOTE — Progress Notes (Signed)
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  Since last being seen in our clinic, the patient reports doing very well.  She has feelings of "jitteriness" and palpitations for which he thinks he may be having afib.   + SOB with moderate activity.   Today, he denies symptoms of  chest pain,  lower extremity edema, dizziness, presyncope, or syncope.  The patient is otherwise without complaint today.   Past Medical History:  Diagnosis Date  . Bilateral knee pain   . Depression    no current meds  . DJD (degenerative joint disease)   . First degree heart block   . GERD (gastroesophageal reflux disease)   . Hypertension   . OA (osteoarthritis) of knee    bilateral  . Obstructive sleep apnea   . Persistent atrial fibrillation ([place]) first dx 10/ 2015-- previous primary cardiologist, [name]   followed by atrial fib. clinic-- [name] NP   Past Surgical History:  Procedure Laterality Date  . APPENDECTOMY  age 65  . ATRIAL FIBRILLATION ABLATION N/A 12/09/2018   Procedure: ATRIAL FIBRILLATION ABLATION;  Surgeon: Thompson Grayer, MD;  Location: [facility];  Service: Cardiovascular;  Laterality: N/A;  . CARDIOVASCULAR STRESS TEST  01-17-2015  [name]   Low risk nuclear study w/ no ischemia/  normal LV function and wal motion,  nuclear stress ef 60%  . CARDIOVERSION N/A 01/20/2015   Procedure: CARDIOVERSION;  Surgeon: [name], MD;  Location: [facility];  Service: Cardiovascular;  Laterality: N/A;  . CARDIOVERSION N/A 06/03/2018   Procedure: CARDIOVERSION;  Surgeon: [name], MD;  Location: [facility] ENDOSCOPY;  Service: Cardiovascular;  Laterality: N/A;  . COLONOSCOPY N/A 09/20/2017   Procedure: COLONOSCOPY;  Surgeon: [name], MD;  [facility];  Service: Endoscopy;  Laterality: N/A;  1:30pm  . colonscopy  age 69  . cysts removed from nipples  in high  school   and neck  . ORIF RIGHT KNEE  age 17  . POLYPECTOMY  09/20/2017   Procedure: POLYPECTOMY;  Surgeon: [name], MD;  [facility];  Service: Endoscopy;;  . TONSILLECTOMY  age 19  . TOTAL KNEE ARTHROPLASTY Bilateral 05/20/2017   Procedure: TOTAL KNEE BILATERAL;  Surgeon: [name], MD;  Location: [facility];  Service: Orthopedics;  Laterality: Bilateral;  Bilateral Adductor Block  . TRANSTHORACIC ECHOCARDIOGRAM  03/29/2017   ef 60-65%/  trivial AR, MR, and TR/  mild LAE/ moderate RAE/  PASP 32mmHg    ROS- all systems are reviewed and negatives except as per HPI above  Current Outpatient Medications  Medication Sig Dispense Refill  . acetaminophen (TYLENOL) 500 MG tablet Take 1,000 mg by mouth as needed for moderate pain.     Marland Kitchen amLODipine (NORVASC) 10 MG tablet TAKE 1 TABLET BY MOUTH EVERY DAY IN THE EVENING 90 tablet 2  . amoxicillin (AMOXIL) 500 MG capsule TAKE 4 CAPSULES BY MOUTH 1 HOUR PRIOR TO APPOINTMENT    . dofetilide (TIKOSYN) 500 MCG capsule Take 1 capsule (500 mcg total) by mouth 2 (two) times daily. 60 capsule 2  . ibuprofen (ADVIL,MOTRIN) 200 MG tablet Take 400-800 mg by mouth as needed for headache or moderate pain.     Marland Kitchen losartan (COZAAR) 100 MG tablet TAKE 1 TABLET BY MOUTH EVERY DAY 90 tablet 2  . Magnesium 250 MG TABS Take 250  mg by mouth daily.     . metoprolol tartrate (LOPRESSOR) 25 MG tablet Take 1 tablet (25 mg total) by mouth 2 (two) times daily. 180 tablet 2  . omeprazole (PRILOSEC) 40 MG capsule Take 40 mg by mouth daily.     Alveda Reasons 20 MG TABS tablet TAKE 1 TABLET (20 MG TOTAL) BY MOUTH DAILY WITH SUPPER. 90 tablet 1   No current facility-administered medications for this visit.    Physical Exam: Vitals:   05/04/20 1050  BP: (!) 150/94  Pulse: 76  SpO2: 96%  Weight: (!) 312 lb 3.2 oz (141.6 kg)  Height: 6' (1.829 m)    GEN- The patient is obese appearing, alert and oriented x 3 today.   Head- normocephalic, atraumatic Eyes-   Sclera clear, conjunctiva pink Ears- hearing intact Oropharynx- clear Lungs- Clear to ausculation bilaterally, normal work of breathing Heart- Regular rate and rhythm, no murmurs, rubs or gallops, PMI not laterally displaced GI- soft, NT, ND, + BS Extremities- no clubbing, cyanosis, or edema  Wt Readings from Last 3 Encounters:  05/04/20 (!) 312 lb 3.2 oz (141.6 kg)  04/11/20 (!) 308 lb (139.7 kg)  11/23/19 (!) 309 lb (140.2 kg)    EKG tracing ordered today is personally reviewed and shows sinus rhythm with first degree AV block (PR 332 msec)  Assessment and Plan:  1. Persistent afib He has done well post ablation He has had palpitations which are concerning to him for possible afib He is on tikosyn.  We will need to follow him closely on this medicine to avoid toxicity. Unfortunately, he has not made much progress with lifestyle changes Given first degree AV block, will stop metoprolol.  Given uncertainty about palpitations, I would advise implantation of an implantable loop recorder for long term arrhythmia monitoring post ablation.  Risks and benefits to ILR were discussed at length with the patient today, including but not limited to risks of bleeding and infection.  Extensive device education was performed.  Remote monitoring was also discussed at length today.  The patient understands and wishes to proceed.  We will proceed at this time with ILR implantation.  If no afib, we could consider potentially stopping tikosyn and OAC (chads2vasc score is 1) down the road.  2. Morbid obesity Body mass index is 42.34 kg/m. Lifestyle modification is advised  3. OSA Compliance with CPAP is encouraged  4. HTN Stable No change required today  5. SOB Unclear etiology Echo and cardiac CT 2020 reviewed Regular exercise and weight loss advised Assess for arrhythmia as the cause with ILR Stop metoprolol If not resolved with above, may consider additional workup.  Return in 3  months  Thompson Grayer MD, [facility] 05/04/2020 10:59 AM      DESCRIPTION OF PROCEDURE:  Informed written consent was obtained.  The patient required no sedation for the procedure today.  The patients left chest was prepped and draped. Mapping over the patient's chest was performed to identify the appropriate ILR site.  This area was found to be the left parasternal region over the 3rd-4th intercostal space.  The skin overlying this region was infiltrated with lidocaine for local analgesia.  A 0.5-cm incision was made at the implant site.  A subcutaneous ILR pocket was fashioned using a combination of sharp and blunt dissection.  A Medtronic Reveal Linq model LNQ 22 (SN [id number] ) implantable loop recorder was then placed into the pocket R waves were very prominent and measured > 0.2 mV. EBL<1  ml.  Steri- Strips and a sterile dressing were then applied.  There were no early apparent complications.     CONCLUSIONS:   1. Successful implantation of a Medtronic Reveal LINQ implantable loop recorder for afib management post ablation and to further evaluate palpitations.  2. No early apparent complications.   Thompson Grayer MD, [facility] 05/04/2020 10:59 AM

## 2020-05-25 ENCOUNTER — Ambulatory Visit (HOSPITAL_COMMUNITY): Payer: BC Managed Care – PPO | Admitting: Nurse Practitioner

## 2020-05-28 ENCOUNTER — Other Ambulatory Visit (HOSPITAL_COMMUNITY): Payer: Self-pay | Admitting: Nurse Practitioner

## 2020-06-05 ENCOUNTER — Other Ambulatory Visit (HOSPITAL_COMMUNITY): Payer: Self-pay | Admitting: Nurse Practitioner

## 2020-06-13 ENCOUNTER — Ambulatory Visit (INDEPENDENT_AMBULATORY_CARE_PROVIDER_SITE_OTHER): Payer: BC Managed Care – PPO

## 2020-06-13 DIAGNOSIS — I441 Atrioventricular block, second degree: Secondary | ICD-10-CM | POA: Diagnosis not present

## 2020-06-15 LAB — CUP PACEART REMOTE DEVICE CHECK
Date Time Interrogation Session: 20220316141503
Implantable Pulse Generator Implant Date: 20220202

## 2020-06-22 NOTE — Progress Notes (Signed)
[facility] Summary Report / Loop Recorder 

## 2020-07-18 ENCOUNTER — Ambulatory Visit (INDEPENDENT_AMBULATORY_CARE_PROVIDER_SITE_OTHER): Payer: BC Managed Care – PPO

## 2020-07-18 DIAGNOSIS — I441 Atrioventricular block, second degree: Secondary | ICD-10-CM

## 2020-07-19 LAB — CUP PACEART REMOTE DEVICE CHECK
Date Time Interrogation Session: 20220414151321
Implantable Pulse Generator Implant Date: 20220202

## 2020-08-03 ENCOUNTER — Ambulatory Visit (INDEPENDENT_AMBULATORY_CARE_PROVIDER_SITE_OTHER): Payer: BC Managed Care – PPO | Admitting: Internal Medicine

## 2020-08-03 ENCOUNTER — Other Ambulatory Visit: Payer: Self-pay

## 2020-08-03 ENCOUNTER — Encounter: Payer: Self-pay | Admitting: Internal Medicine

## 2020-08-03 VITALS — BP 160/92 | HR 91 | Ht 72.0 in | Wt 307.8 lb

## 2020-08-03 DIAGNOSIS — I4819 Other persistent atrial fibrillation: Secondary | ICD-10-CM | POA: Diagnosis not present

## 2020-08-03 DIAGNOSIS — I440 Atrioventricular block, first degree: Secondary | ICD-10-CM

## 2020-08-03 DIAGNOSIS — I10 Essential (primary) hypertension: Secondary | ICD-10-CM

## 2020-08-03 DIAGNOSIS — G4733 Obstructive sleep apnea (adult) (pediatric): Secondary | ICD-10-CM

## 2020-08-03 DIAGNOSIS — Z9989 Dependence on other enabling machines and devices: Secondary | ICD-10-CM | POA: Diagnosis not present

## 2020-08-03 MED ORDER — SPIRONOLACTONE 25 MG PO TABS: 25.0000 mg | ORAL_TABLET | Freq: Every day | ORAL | 3 refills | Status: DC

## 2020-08-03 NOTE — Patient Instructions (Addendum)
Medication Instructions:  Start Spironolactone 25 mg daily Your physician recommends that you continue on your current medications as directed. Please refer to the Current Medication list given to you today.  Labwork: BMP, Mag  Testing/Procedures: None ordered.  Follow-Up: Your physician wants you to follow-up in: 3 months with the [facility]. 4 weeks Lab- BMET  Remote monitoring is used to monitor your Pacemaker of ICD from home. This monitoring reduces the number of office visits required to check your device to one time per year. It allows [place] to keep an eye on the functioning of your device to ensure it is working properly. You are scheduled for a device check from home on 08/22/20. You may send your transmission at any time that day. If you have a wireless device, the transmission will be sent automatically. After your physician reviews your transmission, you will receive a postcard with your next transmission date.  Any Other Special Instructions Will Be Listed Below (If Applicable).  If you need a refill on your cardiac medications before your next appointment, please call your pharmacy.      Low-Sodium Eating Plan (2 grams a day)  Sodium, which is an element that makes up salt, helps you maintain a healthy balance of fluids in your body. Too much sodium can increase your blood pressure and cause fluid and waste to be held in your body. Your health care provider or dietitian may recommend following this plan if you have high blood pressure (hypertension), kidney disease, liver disease, or heart failure. Eating less sodium can help lower your blood pressure, reduce swelling, and protect your heart, liver, and kidneys. What are tips for following this plan? Reading food labels  The Nutrition Facts label lists the amount of sodium in one serving of the food. If you eat more than one serving, you must multiply the listed amount of sodium by the number of servings.  Choose foods with  less than 140 mg of sodium per serving.  Avoid foods with 300 mg of sodium or more per serving. Shopping  Look for lower-sodium products, often labeled as "low-sodium" or "no salt added."  Always check the sodium content, even if foods are labeled as "unsalted" or "no salt added."  Buy fresh foods. ? Avoid canned foods and pre-made or frozen meals. ? Avoid canned, cured, or processed meats.  Buy breads that have less than 80 mg of sodium per slice.   Cooking  Eat more home-cooked food and less restaurant, buffet, and fast food.  Avoid adding salt when cooking. Use salt-free seasonings or herbs instead of table salt or sea salt. Check with your health care provider or pharmacist before using salt substitutes.  Cook with plant-based oils, such as canola, sunflower, or olive oil.   Meal planning  When eating at a restaurant, ask that your food be prepared with less salt or no salt, if possible. Avoid dishes labeled as brined, pickled, cured, smoked, or made with soy sauce, miso, or teriyaki sauce.  Avoid foods that contain MSG (monosodium glutamate). MSG is sometimes added to [place] food, bouillon, and some canned foods.  Make meals that can be grilled, baked, poached, roasted, or steamed. These are generally made with less sodium. General information Most people on this plan should limit their sodium intake to 1,500-2,000 mg (milligrams) of sodium each day. What foods should I eat? Fruits Fresh, frozen, or canned fruit. Fruit juice. Vegetables Fresh or frozen vegetables. "No salt added" canned vegetables. "No salt added" tomato  sauce and paste. Low-sodium or reduced-sodium tomato and vegetable juice. Grains Low-sodium cereals, including oats, puffed wheat and rice, and shredded wheat. Low-sodium crackers. Unsalted rice. Unsalted pasta. Low-sodium bread. Whole-grain breads and whole-grain pasta. Meats and other proteins Fresh or frozen (no salt added) meat, poultry, seafood, and  fish. Low-sodium canned tuna and salmon. Unsalted nuts. Dried peas, beans, and lentils without added salt. Unsalted canned beans. Eggs. Unsalted nut butters. Dairy Milk. Soy milk. Cheese that is naturally low in sodium, such as ricotta cheese, fresh mozzarella, or Swiss cheese. Low-sodium or reduced-sodium cheese. Cream cheese. Yogurt. Seasonings and condiments Fresh and dried herbs and spices. Salt-free seasonings. Low-sodium mustard and ketchup. Sodium-free salad dressing. Sodium-free light mayonnaise. Fresh or refrigerated horseradish. Lemon juice. Vinegar. Other foods Homemade, reduced-sodium, or low-sodium soups. Unsalted popcorn and pretzels. Low-salt or salt-free chips. The items listed above may not be a complete list of foods and beverages you can eat. Contact a dietitian for more information. What foods should I avoid? Vegetables Sauerkraut, pickled vegetables, and relishes. Olives. [place] fries. Onion rings. Regular canned vegetables (not low-sodium or reduced-sodium). Regular canned tomato sauce and paste (not low-sodium or reduced-sodium). Regular tomato and vegetable juice (not low-sodium or reduced-sodium). Frozen vegetables in sauces. Grains Instant hot cereals. Bread stuffing, pancake, and biscuit mixes. Croutons. Seasoned rice or pasta mixes. Noodle soup cups. Boxed or frozen macaroni and cheese. Regular salted crackers. Self-rising flour. Meats and other proteins Meat or fish that is salted, canned, smoked, spiced, or pickled. Precooked or cured meat, such as sausages or meat loaves. Berniece Salines. Ham. Pepperoni. Hot dogs. Corned beef. Chipped beef. Salt pork. Jerky. Pickled herring. Anchovies and sardines. Regular canned tuna. Salted nuts. Dairy Processed cheese and cheese spreads. Hard cheeses. Cheese curds. Blue cheese. Feta cheese. String cheese. Regular cottage cheese. Buttermilk. Canned milk. Fats and oils Salted butter. Regular margarine. Ghee. Bacon fat. Seasonings and  condiments Onion salt, garlic salt, seasoned salt, table salt, and sea salt. Canned and packaged gravies. Worcestershire sauce. Tartar sauce. Barbecue sauce. Teriyaki sauce. Soy sauce, including reduced-sodium. Steak sauce. Fish sauce. Oyster sauce. Cocktail sauce. Horseradish that you find on the shelf. Regular ketchup and mustard. Meat flavorings and tenderizers. Bouillon cubes. Hot sauce. Pre-made or packaged marinades. Pre-made or packaged taco seasonings. Relishes. Regular salad dressings. Salsa. Other foods Salted popcorn and pretzels. Corn chips and puffs. Potato and tortilla chips. Canned or dried soups. Pizza. Frozen entrees and pot pies. The items listed above may not be a complete list of foods and beverages you should avoid. Contact a dietitian for more information. Summary  Eating less sodium can help lower your blood pressure, reduce swelling, and protect your heart, liver, and kidneys.  Most people on this plan should limit their sodium intake to 1,500-2,000 mg (milligrams) of sodium each day.  Canned, boxed, and frozen foods are high in sodium. Restaurant foods, fast foods, and pizza are also very high in sodium. You also get sodium by adding salt to food.  Try to cook at home, eat more fresh fruits and vegetables, and eat less fast food and canned, processed, or prepared foods. This information is not intended to replace advice given to you by your health care provider. Make sure you discuss any questions you have with your health care provider. Document Revised: 04/24/2019 Document Reviewed: 02/18/2019 Elsevier Patient Education  2021 [employer].

## 2020-08-03 NOTE — Progress Notes (Signed)
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  Since last being seen in our clinic, the patient reports doing very well.  His primary concern is with venous stasis/ vascular issues. Today, he denies symptoms of palpitations, chest pain,  lower extremity edema, dizziness, presyncope, or syncope.  The patient is otherwise without complaint today.   Past Medical History:  Diagnosis Date  . Bilateral knee pain   . Depression    no current meds  . DJD (degenerative joint disease)   . First degree heart block   . GERD (gastroesophageal reflux disease)   . Hypertension   . OA (osteoarthritis) of knee    bilateral  . Obstructive sleep apnea   . Persistent atrial fibrillation ([place]) first dx 10/ 2015-- previous primary cardiologist, [name]   followed by atrial fib. clinic-- [name] NP   Past Surgical History:  Procedure Laterality Date  . APPENDECTOMY  age 72  . ATRIAL FIBRILLATION ABLATION N/A 12/09/2018   Procedure: ATRIAL FIBRILLATION ABLATION;  Surgeon: Thompson Grayer, MD;  Location: [place] [facility];  Service: Cardiovascular;  Laterality: N/A;  . CARDIOVASCULAR STRESS TEST  01-17-2015  [name]   Low risk nuclear study w/ no ischemia/  normal LV function and wal motion,  nuclear stress ef 60%  . CARDIOVERSION N/A 01/20/2015   Procedure: CARDIOVERSION;  Surgeon: [name], MD;  [facility];  Service: Cardiovascular;  Laterality: N/A;  . CARDIOVERSION N/A 06/03/2018   Procedure: CARDIOVERSION;  Surgeon: [name], MD;  Location: [facility] END[facility];  Service: Cardiovascular;  Laterality: N/A;  . COLONOSCOPY N/A 09/20/2017   Procedure: COLONOSCOPY;  Surgeon: [name], MD;  [facility];  Service: Endoscopy;  Laterality: N/A;  1:30pm  . colonscopy  age 60  . cysts removed from nipples  in high school   and neck  . implantable loop recorder  placement  05/04/2020   Medtronic Reveal Linq model LNQ 22 (SN [id number] [id number] ) implantable loop recorder  . ORIF RIGHT KNEE  age 70  . POLYPECTOMY  09/20/2017   Procedure: POLYPECTOMY;  Surgeon: [name], MD;  [facility];  Service: Endoscopy;;  . TONSILLECTOMY  age 61  . TOTAL KNEE ARTHROPLASTY Bilateral 05/20/2017   Procedure: TOTAL KNEE BILATERAL;  Surgeon: [name], MD;  Location: [facility];  Service: Orthopedics;  Laterality: Bilateral;  Bilateral Adductor Block  . TRANSTHORACIC ECHOCARDIOGRAM  03/29/2017   ef 60-65%/  trivial AR, MR, and TR/  mild LAE/ moderate RAE/  PASP 74mmHg    ROS- all systems are reviewed and negatives except as per HPI above  Current Outpatient Medications  Medication Sig Dispense Refill  . acetaminophen (TYLENOL) 500 MG tablet Take 1,000 mg by mouth as needed for moderate pain.     Marland Kitchen amLODipine (NORVASC) 10 MG tablet TAKE 1 TABLET BY MOUTH EVERY DAY IN THE EVENING 90 tablet 1  . amoxicillin (AMOXIL) 500 MG capsule TAKE 4 CAPSULES BY MOUTH 1 HOUR PRIOR TO APPOINTMENT    . dofetilide (TIKOSYN) 500 MCG capsule TAKE 1 CAPSULE BY MOUTH TWICE A DAY 180 capsule 1  . ibuprofen (ADVIL,MOTRIN) 200 MG tablet Take 400-800 mg by mouth as needed for headache or moderate pain.     Marland Kitchen losartan (COZAAR) 100 MG tablet TAKE 1 TABLET BY MOUTH EVERY DAY 90 tablet 2  . Magnesium 250 MG  TABS Take 250 mg by mouth daily.     Marland Kitchen omeprazole (PRILOSEC) 40 MG capsule Take 40 mg by mouth daily.     Marland Kitchen telmisartan (MICARDIS) 80 MG tablet Take 80 mg by mouth daily.    Alveda Reasons 20 MG TABS tablet TAKE 1 TABLET (20 MG TOTAL) BY MOUTH DAILY WITH SUPPER. 90 tablet 1   No current facility-administered medications for this visit.    Physical Exam: Vitals:   08/03/20 1508  BP: (!) 160/92  Pulse: 91  SpO2: 96%  Weight: (!) 307 lb 12.8 oz (139.6 kg)  Height: 6' (1.829 m)    GEN- The patient is obese appearing, alert and oriented x 3 today.   Head- normocephalic,  atraumatic Eyes-  Sclera clear, conjunctiva pink Ears- hearing intact Oropharynx- clear Lungs- Clear to ausculation bilaterally, normal work of breathing Heart- Regular rate and rhythm  GI- soft  Extremities- + venous stasis with skin breakdown, + venous malformations  Wt Readings from Last 3 Encounters:  08/03/20 (!) 307 lb 12.8 oz (139.6 kg)  05/04/20 (!) 312 lb 3.2 oz (141.6 kg)  04/11/20 (!) 308 lb (139.7 kg)    EKG tracing ordered today is personally reviewed and shows sinus  Assessment and Plan:  1. Persistent atrial fibrillation Well controlled post ablation (0% by ILR) continue tikosyn.  Hopefully we can eventually discontinue this once BP and weight are improved. chads2vasc score is 1.  We discussed the possibility of stopping [place] therapy.  His father and brother had strokes.  He is clear in his decision to stay on xarelto Bmet, mg today We will need to follow him closely on tikosyn  2. Morbid obesity Body mass index is 41.75 kg/m. Lifestyle modification again advised  3. OSA Compliance with CPAP advised  4. HTN Elevated Start spironolactone 25mg  daily bmet today Repeat bmet in 4 weeks 2 gram sodium diet  5. Venous insufficiency Sodium restriction, support hose advsied Refer to Dr Donzetta Matters for further evaluation  Return to AF clinic in 3 months for aggressive lifestyle modification  Thompson Grayer MD, [facility] 08/03/2020 3:12 PM

## 2020-08-03 NOTE — Progress Notes (Signed)
[facility] Summary Report / Loop Recorder 

## 2020-08-04 LAB — BASIC METABOLIC PANEL
BUN/Creatinine Ratio: 10 (ref 9–20)
BUN: 10 mg/dL (ref 6–24)
CO2: 23 mmol/L (ref 20–29)
Calcium: 9.1 mg/dL (ref 8.7–10.2)
Chloride: 102 mmol/L (ref 96–106)
Creatinine, Ser: 1.04 mg/dL (ref 0.76–1.27)
Glucose: 94 mg/dL (ref 65–99)
Potassium: 4.6 mmol/L (ref 3.5–5.2)
Sodium: 140 mmol/L (ref 134–144)
eGFR: 83 mL/min/{1.73_m2} (ref >59)

## 2020-08-04 LAB — MAGNESIUM: Magnesium: 2.3 mg/dL (ref 1.6–2.3)

## 2020-08-10 ENCOUNTER — Other Ambulatory Visit: Payer: Self-pay

## 2020-08-10 DIAGNOSIS — I83009 Varicose veins of unspecified lower extremity with ulcer of unspecified site: Secondary | ICD-10-CM

## 2020-08-11 ENCOUNTER — Ambulatory Visit (HOSPITAL_COMMUNITY)
Admission: RE | Admit: 2020-08-11 | Discharge: 2020-08-11 | Disposition: A | Discharge status: Home / Self Care | Payer: BC Managed Care – PPO | Source: Ambulatory Visit | Attending: Vascular Surgery | Admitting: Vascular Surgery

## 2020-08-11 ENCOUNTER — Other Ambulatory Visit: Payer: Self-pay

## 2020-08-11 DIAGNOSIS — L97909 Non-pressure chronic ulcer of unspecified part of unspecified lower leg with unspecified severity: Secondary | ICD-10-CM

## 2020-08-11 DIAGNOSIS — I83009 Varicose veins of unspecified lower extremity with ulcer of unspecified site: Secondary | ICD-10-CM | POA: Insufficient documentation

## 2020-08-16 ENCOUNTER — Ambulatory Visit (INDEPENDENT_AMBULATORY_CARE_PROVIDER_SITE_OTHER): Payer: BC Managed Care – PPO | Admitting: Vascular Surgery

## 2020-08-16 ENCOUNTER — Encounter: Payer: Self-pay | Admitting: Vascular Surgery

## 2020-08-16 ENCOUNTER — Other Ambulatory Visit: Payer: Self-pay

## 2020-08-16 VITALS — BP 155/103 | HR 81 | Temp 98.4°F | Resp 20 | Ht 72.0 in | Wt 312.0 lb

## 2020-08-16 DIAGNOSIS — R600 Localized edema: Secondary | ICD-10-CM

## 2020-08-16 NOTE — Progress Notes (Signed)
Patient ID: Sean Morales, male   DOB: [birth year], 60 y.o.   MRN: [id number]  Reason for Consult: New Patient (Initial Visit)   Referred by Sharilyn Sites, MD  Subjective:     HPI:  Sean Morales is a 60 y.o. male history of bilateral lower extremity swelling.  He also has bilateral knee replacements in the past.  He has no history of DVT personal or family.  No previous stroke TIA amaurosis.  No personal or family history of aneurysm disease.  He has never had vascular intervention.  Risk factors include hypertension.  He is anticoagulated for atrial fibrillation although he has undergone ablation.  He has discoloration of the right lateral part of his leg has never had ulceration there.  He does have chronic leg swelling bilaterally which has worsened since knee replacement.  Past Medical History:  Diagnosis Date  . Bilateral knee pain   . Depression    no current meds  . DJD (degenerative joint disease)   . First degree heart block   . GERD (gastroesophageal reflux disease)   . Hypertension   . OA (osteoarthritis) of knee    bilateral  . Obstructive sleep apnea   . Persistent atrial fibrillation ([place]) first dx 10/ 2015-- previous primary cardiologist, [name]   followed by atrial fib. clinic-- [name] NP   Family History  Problem Relation Age of Onset  . Deep vein thrombosis Father   . Stroke Mother   . Colon cancer Neg Hx    Past Surgical History:  Procedure Laterality Date  . APPENDECTOMY  age 56  . ATRIAL FIBRILLATION ABLATION N/A 12/09/2018   Procedure: ATRIAL FIBRILLATION ABLATION;  Surgeon: Thompson Grayer, MD;  Location: [place] [facility];  Service: Cardiovascular;  Laterality: N/A;  . CARDIOVASCULAR STRESS TEST  01-17-2015  [name]   Low risk nuclear study w/ no ischemia/  normal LV function and wal motion,  nuclear stress ef 60%  . CARDIOVERSION N/A 01/20/2015   Procedure: CARDIOVERSION;  Surgeon: [name], MD;  [facility];   Service: Cardiovascular;  Laterality: N/A;  . CARDIOVERSION N/A 06/03/2018   Procedure: CARDIOVERSION;  Surgeon: [name], MD;  Location: [facility] ENDOSCOPY;  Service: Cardiovascular;  Laterality: N/A;  . COLONOSCOPY N/A 09/20/2017   Procedure: COLONOSCOPY;  Surgeon: [name], MD;  [facility];  Service: Endoscopy;  Laterality: N/A;  1:30pm  . colonscopy  age 12  . cysts removed from nipples  in high school   and neck  . implantable loop recorder placement  05/04/2020   Medtronic Reveal Linq model LNQ 22 (SN RLB [id number] ) implantable loop recorder  . ORIF RIGHT KNEE  age 21  . POLYPECTOMY  09/20/2017   Procedure: POLYPECTOMY;  Surgeon: [name], MD;  [facility];  Service: Endoscopy;;  . TONSILLECTOMY  age 52  . TOTAL KNEE ARTHROPLASTY Bilateral 05/20/2017   Procedure: TOTAL KNEE BILATERAL;  Surgeon: [name], MD;  [facility];  Service: Orthopedics;  Laterality: Bilateral;  Bilateral Adductor Block  . TRANSTHORACIC ECHOCARDIOGRAM  03/29/2017   ef 60-65%/  trivial AR, MR, and TR/  mild LAE/ moderate RAE/  PASP 69mmHg    Short Social History:  Social History   Tobacco Use  . Smoking status: Current Every Day Smoker    Packs/day: 1.00    Years: 40.00    Pack years: 40.00    Types: Cigarettes    Start date: 01/08/1977  .  Smokeless tobacco: Never Used  . Tobacco comment: 1 pack daily  Substance Use Topics  . Alcohol use: Yes    Alcohol/week: 1.0 - 2.0 standard drink    Types: 1 - 2 Cans of beer per week    Comment: rarely    Allergies  Allergen Reactions  . Bee Venom Anaphylaxis    [place] Hornet  . Other     Co pyronil per patient not sure  . Penicillins Other (See Comments)    Unknown. Childhood allergy. Has patient had a PCN reaction causing immediate rash, facial/tongue/throat swelling, SOB or lightheadedness with hypotension: Unknown Has patient had a PCN reaction causing severe rash involving mucus membranes or skin  necrosis: Unknown Has patient had a PCN reaction that required hospitalization: Unknown Has patient had a PCN reaction occurring within the last 10 years: No If all of the above answers are "NO", then may proceed with Cephalosporin use.   . Sulfa Antibiotics Other (See Comments)    Unknown. Childhood allergy.  . Tetracyclines & Related Other (See Comments)    Unknown. Childhood allergy.    Current Outpatient Medications  Medication Sig Dispense Refill  . acetaminophen (TYLENOL) 500 MG tablet Take 1,000 mg by mouth as needed for moderate pain.     Marland Kitchen amLODipine (NORVASC) 10 MG tablet TAKE 1 TABLET BY MOUTH EVERY DAY IN THE EVENING 90 tablet 1  . amoxicillin (AMOXIL) 500 MG capsule TAKE 4 CAPSULES BY MOUTH 1 HOUR PRIOR TO APPOINTMENT    . dofetilide (TIKOSYN) 500 MCG capsule TAKE 1 CAPSULE BY MOUTH TWICE A DAY 180 capsule 1  . ibuprofen (ADVIL,MOTRIN) 200 MG tablet Take 400-800 mg by mouth as needed for headache or moderate pain.     . Magnesium 250 MG TABS Take 250 mg by mouth daily.     Marland Kitchen omeprazole (PRILOSEC) 40 MG capsule Take 40 mg by mouth daily.     Marland Kitchen spironolactone (ALDACTONE) 25 MG tablet Take 1 tablet (25 mg total) by mouth daily. 90 tablet 3  . telmisartan (MICARDIS) 80 MG tablet Take 80 mg by mouth daily.    Alveda Reasons 20 MG TABS tablet TAKE 1 TABLET (20 MG TOTAL) BY MOUTH DAILY WITH SUPPER. 90 tablet 1   No current facility-administered medications for this visit.    Review of Systems  Constitutional:  Constitutional negative. HENT: HENT negative.  Eyes: Eyes negative.  Respiratory: Respiratory negative.  Cardiovascular: Positive for leg swelling.  GI: Gastrointestinal negative.  Musculoskeletal: Positive for leg pain.  Skin: Skin negative.  Neurological: Neurological negative. Hematologic: Hematologic/lymphatic negative.  Psychiatric: Psychiatric negative.        Objective:  Objective   Vitals:   08/16/20 0819  BP: (!) 155/103  Pulse: 81  Resp: 20  Temp:  98.4 F (36.9 C)  SpO2: 97%  Weight: (!) 312 lb (141.5 kg)  Height: 6' (1.829 m)   Body mass index is 42.31 kg/m.  Physical Exam HENT:     Head: Normocephalic.     Nose:     Comments: Wearing a mask Eyes:     Pupils: Pupils are equal, round, and reactive to light.  Cardiovascular:     Rate and Rhythm: Normal rate and regular rhythm.     Pulses: Normal pulses.  Pulmonary:     Effort: Pulmonary effort is normal.  Abdominal:     General: Abdomen is flat.     Palpations: Abdomen is soft. There is no mass.  Musculoskeletal:  General: Normal range of motion.     Cervical back: Normal range of motion and neck supple.     Right lower leg: Edema present.     Left lower leg: Edema present.  Skin:    General: Skin is warm.     Capillary Refill: Capillary refill takes less than 2 seconds.     Comments: There is dark discoloration of his skin on his right lateral leg  Neurological:     General: No focal deficit present.     Mental Status: He is alert.  Psychiatric:        Mood and Affect: Mood normal.        Behavior: Behavior normal.        Thought Content: Thought content normal.        Judgment: Judgment normal.     Data: Venous Reflux Times  +--------------+---------+------+-----------+------------+-----------------  ----+  RIGHT     Reflux NoRefluxReflux TimeDiameter cmsComments                      Yes                          +--------------+---------+------+-----------+------------+-----------------  ----+  CFV            yes  >1 second                    +--------------+---------+------+-----------+------------+-----------------  ----+  FV mid    no                                +--------------+---------+------+-----------+------------+-----------------  ----+  Popliteal         yes  >1 second                     +--------------+---------+------+-----------+------------+-----------------  ----+  GSV at SFJ        yes  >500 ms   0.9                +--------------+---------+------+-----------+------------+-----------------  ----+  GSV prox thighno               0.44               +--------------+---------+------+-----------+------------+-----------------  ----+  GSV mid thigh no               0.43               +--------------+---------+------+-----------+------------+-----------------  ----+  GSV dist thighno               0.45               +--------------+---------+------+-----------+------------+-----------------  ----+  GSV at knee  no               0.42               +--------------+---------+------+-----------+------------+-----------------  ----+  GSV prox calf no               0.37               +--------------+---------+------+-----------+------------+-----------------  ----+  SSV Pop Fossa no               0.37  no SPJ noted,  SSV  continues up  thigh    +--------------+---------+------+-----------+------------+-----------------  ----+  SSV prox calf       yes         0.32               +--------------+---------+------+-----------+------------+-----------------  ----+  SSV mid calf       yes         0.43  varicosities  noted    +--------------+---------+------+-----------+------------+-----------------  ----+     +--------------+---------+------+-----------+------------+--------+  LEFT     Reflux NoRefluxReflux TimeDiameter cmsComments               Yes                    +--------------+---------+------+-----------+------------+--------+  CFV      no                         +--------------+---------+------+-----------+------------+--------+  FV mid    no                         +--------------+---------+------+-----------+------------+--------+  Popliteal   no                         +--------------+---------+------+-----------+------------+--------+  GSV at SFJ        yes  >500 ms   0.61        +--------------+---------+------+-----------+------------+--------+  GSV prox thighno               0.38        +--------------+---------+------+-----------+------------+--------+  GSV mid thigh no               0.41        +--------------+---------+------+-----------+------------+--------+  GSV dist thighno               0.34        +--------------+---------+------+-----------+------------+--------+  GSV at knee  no               0.25        +--------------+---------+------+-----------+------------+--------+  GSV prox calf no               0.23        +--------------+---------+------+-----------+------------+--------+  SSV Pop Fossa no               0.38        +--------------+---------+------+-----------+------------+--------+  SSV prox calf no               0.38        +--------------+---------+------+-----------+------------+--------+  SSV mid calf no               0.3         +--------------+---------+------+-----------+------------+--------+         Assessment/Plan:     60 year old male with bilateral lower extremity swelling.  He does not have any saphenous veins for treatment.  He does have spider veins on the right does also  have some skin discoloration on the right.  I have recommended knee-high compression stockings as tolerated.  He can follow-up with [place] on an as-needed basis.     Waynetta Sandy MD [facility]

## 2020-08-22 ENCOUNTER — Ambulatory Visit (INDEPENDENT_AMBULATORY_CARE_PROVIDER_SITE_OTHER): Payer: BC Managed Care – PPO

## 2020-08-22 DIAGNOSIS — I441 Atrioventricular block, second degree: Secondary | ICD-10-CM

## 2020-08-24 ENCOUNTER — Other Ambulatory Visit: Payer: Self-pay | Admitting: Internal Medicine

## 2020-08-24 NOTE — Telephone Encounter (Signed)
Prescription refill request for Xarelto received.   Indication: afib  Last office visit: Allred, 08/03/2020 Weight:141.7 kg  Age: 60 yo  Scr: 1.04, 08/03/2020 CrCl: 153 ml/min   Pt is on the correct dose of Xarelto per dosing criteria, prescription refill sent for Xarelto 20mg  daily.

## 2020-08-25 LAB — CUP PACEART REMOTE DEVICE CHECK
Date Time Interrogation Session: 20220517151021
Implantable Pulse Generator Implant Date: 20220202

## 2020-08-26 DIAGNOSIS — K219 Gastro-esophageal reflux disease without esophagitis: Secondary | ICD-10-CM | POA: Diagnosis not present

## 2020-08-26 DIAGNOSIS — Z7689 Persons encountering health services in other specified circumstances: Secondary | ICD-10-CM | POA: Diagnosis not present

## 2020-08-26 DIAGNOSIS — I10 Essential (primary) hypertension: Secondary | ICD-10-CM | POA: Diagnosis not present

## 2020-08-26 DIAGNOSIS — Z0000 Encounter for general adult medical examination without abnormal findings: Secondary | ICD-10-CM | POA: Diagnosis not present

## 2020-09-02 ENCOUNTER — Other Ambulatory Visit: Payer: BC Managed Care – PPO

## 2020-09-13 NOTE — Progress Notes (Signed)
[facility] Summary Report / Loop Recorder 

## 2020-09-19 ENCOUNTER — Ambulatory Visit (INDEPENDENT_AMBULATORY_CARE_PROVIDER_SITE_OTHER): Payer: BC Managed Care – PPO

## 2020-09-19 DIAGNOSIS — I441 Atrioventricular block, second degree: Secondary | ICD-10-CM

## 2020-09-19 LAB — CUP PACEART REMOTE DEVICE CHECK
Date Time Interrogation Session: 20220619151502
Implantable Pulse Generator Implant Date: 20220202

## 2020-10-05 DIAGNOSIS — Z6841 Body Mass Index (BMI) 40.0 and over, adult: Secondary | ICD-10-CM | POA: Diagnosis not present

## 2020-10-05 DIAGNOSIS — I10 Essential (primary) hypertension: Secondary | ICD-10-CM | POA: Diagnosis not present

## 2020-10-10 NOTE — Addendum Note (Signed)
Addended by: Douglass Rivers D on: 10/10/2020 10:07 AM   Modules accepted: Level of Service

## 2020-10-10 NOTE — Progress Notes (Signed)
[facility] Summary Report / Loop Recorder 

## 2020-10-11 DIAGNOSIS — R0602 Shortness of breath: Secondary | ICD-10-CM | POA: Diagnosis not present

## 2020-10-11 DIAGNOSIS — I4892 Unspecified atrial flutter: Secondary | ICD-10-CM | POA: Diagnosis not present

## 2020-10-11 DIAGNOSIS — K219 Gastro-esophageal reflux disease without esophagitis: Secondary | ICD-10-CM | POA: Diagnosis not present

## 2020-10-11 DIAGNOSIS — I10 Essential (primary) hypertension: Secondary | ICD-10-CM | POA: Diagnosis not present

## 2020-10-12 ENCOUNTER — Telehealth: Payer: Self-pay | Admitting: Internal Medicine

## 2020-10-12 NOTE — Telephone Encounter (Signed)
pt states that pcp is wanting him to start taking the medication carvedilol 6.25mg  spironolacton 25mg   ... and would like to know if Dr.Allred is in agreement with this.. please advise.

## 2020-10-12 NOTE — Telephone Encounter (Signed)
PCP started Carvedilol 6.25 mg BID and Spironolactone 25 mg daily and patient wanted to make sure Dr. Rayann Heman agreed. Spoke to the patient and advised that Dr. Rayann Heman was out of the office at this time. Will check with him when he returns. IF okay with Dr. Rayann Heman no news is good news and continue as prescribed. Patient wanted to make note that spironolactone prescribed by Dr. Rayann Heman in May was never picked up because of issues with the pharmacy. Patient states this is why the PCP went with this medication because they normally don't use this drug and to touch base with his cardiologist.

## 2020-10-15 NOTE — Telephone Encounter (Signed)
I am ok with these changes.

## 2020-10-17 DIAGNOSIS — I10 Essential (primary) hypertension: Secondary | ICD-10-CM | POA: Diagnosis not present

## 2020-10-17 DIAGNOSIS — Z6841 Body Mass Index (BMI) 40.0 and over, adult: Secondary | ICD-10-CM | POA: Diagnosis not present

## 2020-10-17 DIAGNOSIS — K219 Gastro-esophageal reflux disease without esophagitis: Secondary | ICD-10-CM | POA: Diagnosis not present

## 2020-10-17 DIAGNOSIS — Z713 Dietary counseling and surveillance: Secondary | ICD-10-CM | POA: Diagnosis not present

## 2020-10-24 ENCOUNTER — Ambulatory Visit (INDEPENDENT_AMBULATORY_CARE_PROVIDER_SITE_OTHER): Payer: BC Managed Care – PPO

## 2020-10-24 DIAGNOSIS — I4819 Other persistent atrial fibrillation: Secondary | ICD-10-CM | POA: Diagnosis not present

## 2020-10-26 DIAGNOSIS — I10 Essential (primary) hypertension: Secondary | ICD-10-CM | POA: Diagnosis not present

## 2020-10-26 LAB — CUP PACEART REMOTE DEVICE CHECK
Date Time Interrogation Session: 20220722151258
Implantable Pulse Generator Implant Date: 20220202

## 2020-10-31 DIAGNOSIS — Z6841 Body Mass Index (BMI) 40.0 and over, adult: Secondary | ICD-10-CM | POA: Diagnosis not present

## 2020-10-31 DIAGNOSIS — Z713 Dietary counseling and surveillance: Secondary | ICD-10-CM | POA: Diagnosis not present

## 2020-10-31 DIAGNOSIS — I10 Essential (primary) hypertension: Secondary | ICD-10-CM | POA: Diagnosis not present

## 2020-11-01 NOTE — Progress Notes (Signed)
Electrophysiology Office Note Date: 11/03/2020  ID:  Sean Morales, DOB June 30, 1960, MRN [id number]  PCP: Sean Sites, MD Primary Cardiologist: None Electrophysiologist: Sean Grayer, MD   CC: ILR follow-up  Sean Morales is a 60 y.o. male seen today for Dr. Rayann Morales . he presents today for routine electrophysiology followup.  Since last being seen in our clinic, the patient reports doing very well. Peripheral edema is better controlled on spironolactone. BP at home is now 120-130s.  He denies chest pain, palpitations, dyspnea, PND, orthopnea, nausea, vomiting, dizziness, syncope, edema, weight gain, or early satiety.  Device History: Medtronic loop recorder implanted 05/2020 for palpitations and afib management  Past Medical History:  Diagnosis Date   Bilateral knee pain    Depression    no current meds   DJD (degenerative joint disease)    First degree heart block    GERD (gastroesophageal reflux disease)    Hypertension    OA (osteoarthritis) of knee    bilateral   Obstructive sleep apnea    Persistent atrial fibrillation ([place]) first dx 10/ 2015-- previous primary cardiologist, dr Sean Morales   followed by atrial fib. clinic-- Sean Devoid NP   Past Surgical History:  Procedure Laterality Date   APPENDECTOMY  age 89   ATRIAL FIBRILLATION ABLATION N/A 12/09/2018   Procedure: [place];  Surgeon: Sean Grayer, MD;  Location: [place] [facility];  Service: Cardiovascular;  Laterality: N/A;   CARDIOVASCULAR STRESS TEST  01-17-2015  dr Sean Morales   Low risk nuclear study w/ no ischemia/  normal LV function and wal motion,  nuclear stress ef 60%   CARDIOVERSION N/A 01/20/2015   Procedure: CARDIOVERSION;  Surgeon: Sean Commons, MD;  [facility]: [facility];  Service: Cardiovascular;  Laterality: N/A;   CARDIOVERSION N/A 06/03/2018   Procedure: CARDIOVERSION;  Surgeon: Sean Margarita, MD;  Location: [facility] ENDOSCOPY;  Service: Cardiovascular;  Laterality: N/A;    COLONOSCOPY N/A 09/20/2017   Procedure: COLONOSCOPY;  Surgeon: Sean Dolin, MD;  [facility];  Service: Endoscopy;  Laterality: N/A;  1:30pm   colonscopy  age 66   cysts removed from nipples  in high school   and neck   implantable loop recorder placement  05/04/2020   Medtronic Reveal Linq model LNQ 22 (SN [id number] ) implantable loop recorder   ORIF RIGHT KNEE  age 66   POLYPECTOMY  09/20/2017   Procedure: POLYPECTOMY;  Surgeon: Sean Dolin, MD;  [facility];  Service: Endoscopy;;   TONSILLECTOMY  age 64   TOTAL KNEE ARTHROPLASTY Bilateral 05/20/2017   Procedure: TOTAL KNEE BILATERAL;  Surgeon: Sean Cancel, MD;  Location: [facility];  Service: Orthopedics;  Laterality: Bilateral;  Bilateral Adductor Block   TRANSTHORACIC ECHOCARDIOGRAM  03/29/2017   ef 60-65%/  trivial AR, MR, and TR/  mild LAE/ moderate RAE/  PASP 56mHg    Current Outpatient Medications  Medication Sig Dispense Refill   acetaminophen (TYLENOL) 500 MG tablet Take 1,000 mg by mouth as needed for moderate pain.      amLODipine (NORVASC) 10 MG tablet TAKE 1 TABLET BY MOUTH EVERY DAY IN THE EVENING 90 tablet 1   amoxicillin (AMOXIL) 500 MG capsule TAKE 4 CAPSULES BY MOUTH 1 HOUR PRIOR TO APPOINTMENT     carvedilol (COREG) 6.25 MG tablet Take 6.25 mg by mouth 2 (two) times daily.     dofetilide (TIKOSYN) 500 MCG capsule TAKE 1 CAPSULE BY MOUTH TWICE A DAY 180 capsule 1  ibuprofen (ADVIL,MOTRIN) 200 MG tablet Take 400-800 mg by mouth as needed for headache or moderate pain.      Magnesium 250 MG TABS Take 250 mg by mouth daily.      omeprazole (PRILOSEC) 40 MG capsule Take 40 mg by mouth daily.      spironolactone (ALDACTONE) 50 MG tablet Take 50 mg by mouth daily.     telmisartan (MICARDIS) 80 MG tablet Take 80 mg by mouth daily.     XARELTO 20 MG TABS tablet TAKE 1 TABLET (20 MG TOTAL) BY MOUTH DAILY WITH SUPPER. 90 tablet 1   No current facility-administered medications for this visit.     Allergies:   Bee venom, Other, Penicillins, Sulfa antibiotics, and Tetracyclines & related   Social History: Social History   Socioeconomic History   Marital status: Married    Spouse name: Sean Morales   Number of children: 4   Years of education: 14   Highest education level: Not on file  Occupational History    Comment: [employer]  Tobacco Use   Smoking status: Every Day    Packs/day: 1.00    Years: 40.00    Pack years: 40.00    Types: Cigarettes    Start date: 01/08/1977   Smokeless tobacco: Never   Tobacco comments:    1 pack daily  Vaping Use   Vaping Use: Never used  Substance and Sexual Activity   Alcohol use: Yes    Alcohol/week: 1.0 - 2.0 standard drink    Types: 1 - 2 Cans of beer per week    Comment: rarely   Drug use: No   Sexual activity: Yes  Other Topics Concern   Not on file  Social History Narrative   Lives in [place] (near [place]) with wife.   Patient 1 cup of coffee daily,occas. 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  Physical Exam: Vitals:   11/03/20 0905  Height: 6' (1.829 m)     GEN- The patient is well appearing, alert and oriented x 3 today.   HEENT: normocephalic, atraumatic; sclera clear, conjunctiva pink; hearing intact; oropharynx clear; neck supple  Lungs- Clear to ausculation bilaterally, normal work of breathing.  No wheezes, rales, rhonchi Heart- Regular rate  and rhythm, no murmurs, rubs or gallops  GI- soft, non-tender, non-distended, bowel sounds present  Extremities- no clubbing, cyanosis, or edema  MS- no significant deformity or atrophy Skin- warm and dry, no rash or lesion; PPM pocket well healed Psych- euthymic mood, full affect Neuro- strength and sensation are intact  PPM Interrogation- reviewed in detail today,  See PACEART report  EKG:  EKG is ordered today. Personal review shows NSR at 76 bpm with stable QTc at 436 ms, PAC  Recent Labs: 08/03/2020: BUN 10; Creatinine, Ser 1.04; Magnesium 2.3; Potassium 4.6; Sodium 140   Wt Readings from  Last 3 Encounters:  08/16/20 (!) 312 lb (141.5 kg)  08/03/20 (!) 307 lb 12.8 oz (139.6 kg)  05/04/20 (!) 312 lb 3.2 oz (141.6 kg)     Other studies Reviewed: Additional studies/ records that were reviewed today include: Previous EP office notes, Previous remote checks, Most recent labwork.   Assessment and Plan:   1. Persistent atrial fibrillation s/p ablation and loop recorder Well controlled post ablation 0.0% burden by ILR Continue tikosyn.  Hopefully we can eventually discontinue this once BP and weight are improved. Currently on Xarelto despite chads2vasc score of 1. His father and brother had strokes and he wishes to continue.  BMET and Mg today.   2. Morbid obesity Body mass index is 42.31 kg/m.  Lifestyle modification again advised He is working with [facility]   3. OSA Encouraged nightly CPAP.    4. HTN Continue current medications.    5. Venous insufficiency Fluid and sodium restriction.  Has seen Dr. Donzetta Matters.   Current medicines are reviewed at length with the patient today.   The patient does not have concerns regarding his medicines.  The following changes were made today:  none  Labs/ tests ordered today include:  No orders of the defined types were placed in this encounter.  Disposition:   Follow up with  AF clinic in 3 months for coninued  lifestyle modification. He does not need to see them as well this month.   See Dr. Rayann Morales in 9 months to discuss coming off tikosyn if he continues to work on his weight and BP, and remains without AF on linq.    Jacalyn Lefevre, PA-C  11/03/2020 9:20 AM  [facility] [address] [place] [address] [phone] (office) [phone] (fax)

## 2020-11-03 ENCOUNTER — Other Ambulatory Visit: Payer: Self-pay

## 2020-11-03 ENCOUNTER — Ambulatory Visit (INDEPENDENT_AMBULATORY_CARE_PROVIDER_SITE_OTHER): Payer: BC Managed Care – PPO | Admitting: Student

## 2020-11-03 ENCOUNTER — Encounter: Payer: Self-pay | Admitting: Student

## 2020-11-03 VITALS — BP 141/98 | HR 76 | Ht 72.0 in | Wt 310.8 lb

## 2020-11-03 DIAGNOSIS — Z9989 Dependence on other enabling machines and devices: Secondary | ICD-10-CM

## 2020-11-03 DIAGNOSIS — G4733 Obstructive sleep apnea (adult) (pediatric): Secondary | ICD-10-CM

## 2020-11-03 DIAGNOSIS — I4819 Other persistent atrial fibrillation: Secondary | ICD-10-CM | POA: Diagnosis not present

## 2020-11-03 DIAGNOSIS — I10 Essential (primary) hypertension: Secondary | ICD-10-CM

## 2020-11-03 LAB — BASIC METABOLIC PANEL
BUN/Creatinine Ratio: 17 (ref 9–20)
BUN: 17 mg/dL (ref 6–24)
CO2: 21 mmol/L (ref 20–29)
Calcium: 9 mg/dL (ref 8.7–10.2)
Chloride: 102 mmol/L (ref 96–106)
Creatinine, Ser: 0.99 mg/dL (ref 0.76–1.27)
Glucose: 102 mg/dL — ABNORMAL HIGH (ref 65–99)
Potassium: 4.9 mmol/L (ref 3.5–5.2)
Sodium: 140 mmol/L (ref 134–144)
eGFR: 88 mL/min/{1.73_m2} (ref >59)

## 2020-11-03 LAB — MAGNESIUM: Magnesium: 2.2 mg/dL (ref 1.6–2.3)

## 2020-11-03 NOTE — Patient Instructions (Signed)
Medication Instructions:  Your physician recommends that you continue on your current medications as directed. Please refer to the Current Medication list given to you today.  *If you need a refill on your cardiac medications before your next appointment, please call your pharmacy*   Lab Work: TODAY: BMET, Mg  If you have labs (blood work) drawn today and your tests are completely normal, you will receive your results only by: [place] (if you have MyChart) OR A paper copy in the mail If you have any lab test that is abnormal or we need to change your treatment, we will call you to review the results.   Follow-Up: At [facility], you and your health needs are our priority.  As part of our continuing mission to provide you with exceptional heart care, we have created designated Provider Care Teams.  These Care Teams include your primary Cardiologist (physician) and Advanced Practice Providers (APPs -  Physician Assistants and Nurse Practitioners) who all work together to provide you with the care you need, when you need it.   Your next appointment:   3 months in the A-Fib Clinic 9 months with Dr Rayann Heman

## 2020-11-17 ENCOUNTER — Ambulatory Visit (HOSPITAL_COMMUNITY): Payer: BC Managed Care – PPO | Admitting: Nurse Practitioner

## 2020-11-17 NOTE — Progress Notes (Signed)
[facility] Summary Report / Loop Recorder 

## 2020-11-22 DIAGNOSIS — R221 Localized swelling, mass and lump, neck: Secondary | ICD-10-CM | POA: Diagnosis not present

## 2020-11-22 DIAGNOSIS — I10 Essential (primary) hypertension: Secondary | ICD-10-CM | POA: Diagnosis not present

## 2020-11-25 ENCOUNTER — Other Ambulatory Visit (HOSPITAL_COMMUNITY): Payer: Self-pay | Admitting: Nurse Practitioner

## 2020-11-28 ENCOUNTER — Ambulatory Visit (INDEPENDENT_AMBULATORY_CARE_PROVIDER_SITE_OTHER): Payer: BC Managed Care – PPO

## 2020-11-28 DIAGNOSIS — I440 Atrioventricular block, first degree: Secondary | ICD-10-CM | POA: Diagnosis not present

## 2020-11-28 LAB — CUP PACEART REMOTE DEVICE CHECK
Date Time Interrogation Session: 20220824151400
Implantable Pulse Generator Implant Date: 20220202

## 2020-11-29 ENCOUNTER — Other Ambulatory Visit (HOSPITAL_COMMUNITY): Payer: Self-pay | Admitting: Nurse Practitioner

## 2020-11-29 DIAGNOSIS — R221 Localized swelling, mass and lump, neck: Secondary | ICD-10-CM | POA: Diagnosis not present

## 2020-12-09 NOTE — Progress Notes (Signed)
[facility] Summary Report / Loop Recorder 

## 2020-12-15 DIAGNOSIS — R590 Localized enlarged lymph nodes: Secondary | ICD-10-CM | POA: Diagnosis not present

## 2020-12-15 DIAGNOSIS — R221 Localized swelling, mass and lump, neck: Secondary | ICD-10-CM | POA: Diagnosis not present

## 2021-01-02 ENCOUNTER — Ambulatory Visit (INDEPENDENT_AMBULATORY_CARE_PROVIDER_SITE_OTHER): Payer: BC Managed Care – PPO

## 2021-01-02 DIAGNOSIS — I4819 Other persistent atrial fibrillation: Secondary | ICD-10-CM

## 2021-01-03 LAB — CUP PACEART REMOTE DEVICE CHECK
Date Time Interrogation Session: 20221004084247
Implantable Pulse Generator Implant Date: 20220202

## 2021-01-10 NOTE — Progress Notes (Signed)
[facility] Summary Report / Loop Recorder 

## 2021-01-25 DIAGNOSIS — Q825 Congenital non-neoplastic nevus: Secondary | ICD-10-CM | POA: Diagnosis not present

## 2021-01-25 DIAGNOSIS — L820 Inflamed seborrheic keratosis: Secondary | ICD-10-CM | POA: Diagnosis not present

## 2021-01-25 DIAGNOSIS — L2089 Other atopic dermatitis: Secondary | ICD-10-CM | POA: Diagnosis not present

## 2021-01-25 DIAGNOSIS — D485 Neoplasm of uncertain behavior of skin: Secondary | ICD-10-CM | POA: Diagnosis not present

## 2021-01-25 DIAGNOSIS — L7212 Trichodermal cyst: Secondary | ICD-10-CM | POA: Diagnosis not present

## 2021-01-25 DIAGNOSIS — L918 Other hypertrophic disorders of the skin: Secondary | ICD-10-CM | POA: Diagnosis not present

## 2021-01-31 LAB — CUP PACEART REMOTE DEVICE CHECK
Date Time Interrogation Session: 20221029151430
Implantable Pulse Generator Implant Date: 20220202

## 2021-02-06 ENCOUNTER — Ambulatory Visit (INDEPENDENT_AMBULATORY_CARE_PROVIDER_SITE_OTHER): Payer: BC Managed Care – PPO

## 2021-02-06 DIAGNOSIS — I4819 Other persistent atrial fibrillation: Secondary | ICD-10-CM

## 2021-02-07 ENCOUNTER — Ambulatory Visit (HOSPITAL_COMMUNITY)
Admission: RE | Admit: 2021-02-07 | Discharge: 2021-02-07 | Disposition: A | Discharge status: Home / Self Care | Payer: BC Managed Care – PPO | Source: Ambulatory Visit | Attending: Nurse Practitioner | Admitting: Nurse Practitioner

## 2021-02-07 ENCOUNTER — Encounter (HOSPITAL_COMMUNITY): Payer: Self-pay | Admitting: Nurse Practitioner

## 2021-02-07 VITALS — BP 144/90 | HR 73 | Ht 72.0 in | Wt 313.8 lb

## 2021-02-07 DIAGNOSIS — I4819 Other persistent atrial fibrillation: Secondary | ICD-10-CM | POA: Diagnosis not present

## 2021-02-07 DIAGNOSIS — G4733 Obstructive sleep apnea (adult) (pediatric): Secondary | ICD-10-CM | POA: Diagnosis not present

## 2021-02-07 DIAGNOSIS — I4892 Unspecified atrial flutter: Secondary | ICD-10-CM | POA: Insufficient documentation

## 2021-02-07 DIAGNOSIS — Z7901 Long term (current) use of anticoagulants: Secondary | ICD-10-CM | POA: Diagnosis not present

## 2021-02-07 DIAGNOSIS — Z79899 Other long term (current) drug therapy: Secondary | ICD-10-CM | POA: Diagnosis not present

## 2021-02-07 DIAGNOSIS — I10 Essential (primary) hypertension: Secondary | ICD-10-CM | POA: Insufficient documentation

## 2021-02-07 DIAGNOSIS — D6869 Other thrombophilia: Secondary | ICD-10-CM | POA: Diagnosis not present

## 2021-02-07 DIAGNOSIS — F17210 Nicotine dependence, cigarettes, uncomplicated: Secondary | ICD-10-CM | POA: Diagnosis not present

## 2021-02-07 NOTE — Progress Notes (Signed)
Patient ID: ANTWAUN BUTH, male   DOB: 1960-08-26, 60 y.o.   MRN: [id number]     Primary Care Physician: Sharilyn Sites, MD Referring Physician:  Dr. Rayann Heman EP: Dr. Merlene Morse is a 60 y.o. male with a h/o persistent afib on tikosyn therapy/xarelto. He has done well since 2016, when loaded on Tikosyn   until the last several weeks when he went back into afib/flutter. He had amiodarone in late 2015 and failed cardioversion. He had been persistent afib for a year at that time. He was seen by Dr. Rayann Heman 05/2014 who initiated tikosyn.   He returned to the clinic  for f/u cardioversion 06/03/18.Marland Kitchen He had SR for about 10 mins and then returned to afib. Other options discussed and he wanted to proceed with ablation. . Is very religious to wear cpap for treatment of OSA. Struggles with weight loss. Symptomatic with fatigue and shortness of breath in afib.  F/u in afib clinic one month past ablation. He has been staying in [place]. He did have bruising at the groin   that has resolved. Sore throat right after ablation that has also resolved. He continues to smoke 1 pack a day. He has gained 10 lbs.  He is trying to walk 5000 steps daily but has felt that he is still winded with exertion. He felt after ablation that he would feel better very quickly Today, I discussed Coumadin as well as novel anticoagulants including Pradaxa, Xarelto, Savaysa, and Eliquis today as indicated for risk reduction in stroke and systemic emboli with nonvalvular atrial fibrillation.  Risks, benefits, and alternatives to each of these drugs were discussed at length today. PND/orthopnea.  F/u in afib clinic, 05/05/19. He asked to be seen this am as he has not felt well for around 2-3 weeks. He has noted headaches/ extreme fatgiue/ fluttering of the heart that would only last minutes. His  symptoms have improved but linger. He  was exposed to covid 03/30/19 from his adult daughter. 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 He  did not notice any sinus issues or loss of smell/taste. He is SR today. Weight around the same. BP elevated this am as he did not take his am meds yet. Continues on xarelto 20 mg daily for at least 1.   F/u in afib clinic, 8/23. He has not noted any afib. He remains on dofetilide after his ablation 12/09/18. He has had both covid shots. He has not noted any bleeding on xarelto for a CHA2DS2VASc score of 1. He wishes to stay on anticoagulation for family history of stroke and tikosyn, as he has stressful issues going on with the sale of a house and does not want to risk going into afib at this time.   F/u in afib clinic, 02/07/21. He remains in SR on dofetilide. Afib is staying quiet. Continues on xarelto. Has had some  intermittent nose bleeds.   Today, he denies symptoms  of shortness of breath, orthopnea, PND, lower extremity edema, dizziness, presyncope, syncope, or neurologic sequela.Positive of symptoms as  listed above.The patient is tolerating medications without difficulties and is otherwise without complaint today.   Past Medical History:  Diagnosis Date   Bilateral knee pain    Depression    no current meds   DJD (degenerative joint disease)    First degree heart block    GERD (  gastroesophageal reflux disease)    Hypertension    OA (osteoarthritis) of knee    bilateral   Obstructive sleep apnea    Persistent atrial fibrillation ([place]) first dx 10/ 2015-- previous primary cardiologist, [name]   followed by atrial fib. clinic-- [name] NP   Past Surgical History:  Procedure Laterality Date   APPENDECTOMY  age 46   ATRIAL FIBRILLATION ABLATION N/A 12/09/2018   Procedure: [place];  Surgeon: Thompson Grayer, MD;  Location: [place] [facility];  Service: Cardiovascular;  Laterality: N/A;   CARDIOVASCULAR STRESS TEST  01-17-2015  [name]   Low risk nuclear study w/ no ischemia/  normal  LV function and wal motion,  nuclear stress ef 60%   CARDIOVERSION N/A 01/20/2015   Procedure: CARDIOVERSION;  Surgeon: [name], MD;  Location: [facility];  Service: Cardiovascular;  Laterality: N/A;   CARDIOVERSION N/A 06/03/2018   Procedure: CARDIOVERSION;  Surgeon: [name], MD;  Location: [facility] ENDOSCOPY;  Service: Cardiovascular;  Laterality: N/A;   COLONOSCOPY N/A 09/20/2017   Procedure: COLONOSCOPY;  Surgeon: [name], MD;  [facility];  Service: Endoscopy;  Laterality: N/A;  1:30pm   colonscopy  age 67   cysts removed from nipples  in high school   and neck   implantable loop recorder placement  05/04/2020   Medtronic Reveal Linq model LNQ 22 (SN [id number] ) implantable loop recorder   ORIF RIGHT KNEE  age 31   POLYPECTOMY  09/20/2017   Procedure: POLYPECTOMY;  Surgeon: [name], MD;  [facility];  Service: Endoscopy;;   TONSILLECTOMY  age 56   TOTAL KNEE ARTHROPLASTY Bilateral 05/20/2017   Procedure: TOTAL KNEE BILATERAL;  Surgeon: [name], MD;  Location: [facility];  Service: Orthopedics;  Laterality: Bilateral;  Bilateral Adductor Block   TRANSTHORACIC ECHOCARDIOGRAM  03/29/2017   ef 60-65%/  trivial AR, MR, and TR/  mild LAE/ moderate RAE/  PASP 64mmHg    Current Outpatient Medications  Medication Sig Dispense Refill   acetaminophen (TYLENOL) 500 MG tablet Take 1,000 mg by mouth as needed for moderate pain.      amLODipine (NORVASC) 10 MG tablet TAKE 1 TABLET BY MOUTH EVERY DAY IN THE EVENING 90 tablet 3   amoxicillin (AMOXIL) 500 MG capsule TAKE 4 CAPSULES BY MOUTH 1 HOUR PRIOR TO APPOINTMENT     carvedilol (COREG) 6.25 MG tablet Take 6.25 mg by mouth 2 (two) times daily.     dofetilide (TIKOSYN) 500 MCG capsule TAKE 1 CAPSULE BY MOUTH TWICE A DAY 60 capsule 5   ibuprofen (ADVIL,MOTRIN) 200 MG tablet Take 400-800 mg by mouth as needed for headache or moderate pain.      Magnesium 250 MG TABS Take 250 mg by mouth daily.       omeprazole (PRILOSEC) 40 MG capsule Take 40 mg by mouth daily.      spironolactone (ALDACTONE) 50 MG tablet Take 50 mg by mouth daily.     telmisartan (MICARDIS) 80 MG tablet Take 80 mg by mouth daily.     XARELTO 20 MG TABS tablet TAKE 1 TABLET (20 MG TOTAL) BY MOUTH DAILY WITH SUPPER. 90 tablet 1   No current facility-administered medications for this encounter.    Allergies  Allergen Reactions   Bee Venom Anaphylaxis    Japanese Hornet   Other     Co pyronil per patient not sure   Penicillins Other (See Comments)    Unknown.  Childhood allergy. Has patient had a PCN reaction causing immediate rash, facial/tongue/throat swelling, SOB or lightheadedness with hypotension: Unknown Has patient had a PCN reaction causing severe rash involving mucus membranes or skin necrosis: Unknown Has patient had a PCN reaction that required hospitalization: Unknown Has patient had a PCN reaction occurring within the last 10 years: No If all of the above answers are "NO", then may proceed with Cephalosporin use.    Sulfa Antibiotics Other (See Comments)    Unknown. Childhood allergy.   Tetracyclines & Related Other (See Comments)    Unknown. Childhood allergy.    Social History   Socioeconomic History   Marital status: Married    Spouse name: [name]   Number of children: 4   Years of education: 14   Highest education level: Not on file  Occupational History    Comment: Transport planner [employer]  Tobacco Use   Smoking status: Every Day    Packs/day: 1.00    Years: 40.00    Pack years: 40.00    Types: Cigarettes    Start date: 01/08/1977   Smokeless tobacco: Never   Tobacco comments:    1 pack daily  Vaping Use   Vaping Use: Never used  Substance and Sexual Activity   Alcohol use: Yes    Alcohol/week: 1.0 - 2.0 standard drink    Types: 1 - 2 Cans of beer per week    Comment: rarely   Drug use: No   Sexual activity: Yes  Other Topics Concern   Not on file  Social History Narrative    Lives in [place] (near [place]) with wife.   Patient 1 cup of coffee daily,occas. 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 Pulse Ox 97%  GEN- The patient is well appearing, alert and oriented x 3 today.   Head- normocephalic, atraumatic Eyes-  Sclera clear, conjunctiva pink Ears- hearing intact Oropharynx- clear Neck- supple, no JVP Lymph- no cervical lymphadenopathy Lungs- Clear to ausculation bilaterally, normal work of breathing Heart- regular rate and rhythm, no murmurs, rubs or gallops, PMI not laterally displaced GI- soft, NT, ND, + BS Extremities- no clubbing, cyanosis, or edema MS- no significant deformity or atrophy Skin- no rash or lesion Psych- euthymic mood, full affect Neuro- strength and sensation are intact  EKG- sinus rhythm with first degree AV block, pr int 286 ms, qrs int 76 ms, qtc 462 ms Echo- 2018-Study Conclusions   - Left ventricle: The cavity size was normal. Wall thickness was   normal. Systolic function was normal. The estimated ejection   fraction was in the range of 60% to 65%. Mildly reduced GLS at   -16%. Wall motion was normal; there were no regional wall motion   abnormalities. Left ventricular diastolic function parameters   were normal. - Aortic valve: Trileaflet. Sclerosis without  stenosis. There was   trivial regurgitation. - Mitral valve: Mildly thickened leaflets  . There was trivial   regurgitation. - Left atrium: The atrium was mildly dilated. - Right atrium: Moderately dilated. - Tricuspid valve: There was trivial regurgitation. - Pulmonary arteries: PA peak pressure: 33 mm Hg (S). - Inferior vena cava: The vessel was dilated. The respirophasic   diameter changes were blunted (< 50%), consistent with elevated   central venous pressure.   Impressions:   - Compared to a prior study in 2015, the LVEF is higher at 42-10% -   diastolic function is normal. There is moderate RAE and mild LAE.  Assessment and Plan: 1. Persistent afib/flutter S/p ablation 12/2018 Doing well staying in SR  Continue tikosyn 500 mcg bid Continue metoprolol 25 mg bid  Continue xarelto for chadsvasc score of at least 1  If nosebleeds do not resolve, then he is asked to see ENT and if no finding there, then can consider switching to eliquis He wishes to stay on DOAC because of family history    2. HTN Stable   F/u with afib clinic in 6 months   Butch Penny C. Lenardo Westwood, [facility] [address] [place],  [address] [phone]

## 2021-02-08 DIAGNOSIS — L7212 Trichodermal cyst: Secondary | ICD-10-CM | POA: Diagnosis not present

## 2021-02-08 LAB — BASIC METABOLIC PANEL
Anion gap: 10 (ref 5–15)
BUN: 14 mg/dL (ref 6–20)
CO2: 24 mmol/L (ref 22–32)
Calcium: 8.9 mg/dL (ref 8.9–10.3)
Chloride: 105 mmol/L (ref 98–111)
Creatinine, Ser: 1.02 mg/dL (ref 0.61–1.24)
GFR, Estimated: >60 mL/min (ref >60)
Glucose, Bld: 99 mg/dL (ref 70–99)
Potassium: 4.7 mmol/L (ref 3.5–5.1)
Sodium: 139 mmol/L (ref 135–145)

## 2021-02-08 LAB — MAGNESIUM: Magnesium: 2.2 mg/dL (ref 1.7–2.4)

## 2021-02-13 NOTE — Progress Notes (Signed)
[facility] Summary Report / Loop Recorder 

## 2021-02-22 ENCOUNTER — Other Ambulatory Visit: Payer: Self-pay | Admitting: Internal Medicine

## 2021-02-22 DIAGNOSIS — I4819 Other persistent atrial fibrillation: Secondary | ICD-10-CM

## 2021-02-22 NOTE — Telephone Encounter (Signed)
Xarelto 20mg  refill request received. Pt is 60 years old, weight-142.3kg, Crea-1.02 on 02/07/2021, last seen by Roderic Palau on 02/07/2021, Diagnosis-Afib, CrCl-155.66ml/min; Dose is appropriate based on dosing criteria. Will send in refill to requested pharmacy.

## 2021-03-08 LAB — CUP PACEART REMOTE DEVICE CHECK
Date Time Interrogation Session: 20221201151316
Implantable Pulse Generator Implant Date: 20220202

## 2021-03-13 ENCOUNTER — Ambulatory Visit (INDEPENDENT_AMBULATORY_CARE_PROVIDER_SITE_OTHER): Payer: BC Managed Care – PPO

## 2021-03-13 DIAGNOSIS — I441 Atrioventricular block, second degree: Secondary | ICD-10-CM

## 2021-03-22 NOTE — Progress Notes (Signed)
[facility] Summary Report / Loop Recorder 

## 2021-04-17 ENCOUNTER — Ambulatory Visit (INDEPENDENT_AMBULATORY_CARE_PROVIDER_SITE_OTHER): Payer: BC Managed Care – PPO

## 2021-04-17 DIAGNOSIS — I441 Atrioventricular block, second degree: Secondary | ICD-10-CM | POA: Diagnosis not present

## 2021-04-18 LAB — CUP PACEART REMOTE DEVICE CHECK
Date Time Interrogation Session: 20230115230236
Implantable Pulse Generator Implant Date: 20220202

## 2021-04-28 NOTE — Progress Notes (Signed)
[facility] Summary Report / Loop Recorder 

## 2021-05-11 ENCOUNTER — Other Ambulatory Visit (HOSPITAL_COMMUNITY): Payer: Self-pay | Admitting: Nurse Practitioner

## 2021-05-20 LAB — CUP PACEART REMOTE DEVICE CHECK
Date Time Interrogation Session: 20230217230218
Implantable Pulse Generator Implant Date: 20220202

## 2021-05-22 ENCOUNTER — Ambulatory Visit (INDEPENDENT_AMBULATORY_CARE_PROVIDER_SITE_OTHER): Payer: BC Managed Care – PPO

## 2021-05-22 DIAGNOSIS — I441 Atrioventricular block, second degree: Secondary | ICD-10-CM | POA: Diagnosis not present

## 2021-05-26 NOTE — Progress Notes (Signed)
[facility] Summary Report / Loop Recorder 

## 2021-06-26 ENCOUNTER — Ambulatory Visit (INDEPENDENT_AMBULATORY_CARE_PROVIDER_SITE_OTHER): Payer: BC Managed Care – PPO

## 2021-06-26 DIAGNOSIS — I441 Atrioventricular block, second degree: Secondary | ICD-10-CM | POA: Diagnosis not present

## 2021-06-27 LAB — CUP PACEART REMOTE DEVICE CHECK
Date Time Interrogation Session: 20230326230457
Implantable Pulse Generator Implant Date: 20220202

## 2021-07-06 NOTE — Progress Notes (Signed)
[facility] Summary Report / Loop Recorder 

## 2021-07-13 IMAGING — CT CT HEART MORPH/PULM VEIN W/ CM & W/O CA SCORE
3 of 9 series · 9 of 20 positions shown, 10 images · IV contrast (APPLIED)
Comparison: None.
COMPARISON: None.

Addendum:
EXAM:
OVER-READ INTERPRETATION  CT CHEST

The following report is an over-read performed by radiologist Dr.
Amnon Tiger [REDACTED] on 12/04/2018. This
over-read does not include interpretation of cardiac or coronary
anatomy or pathology. The pulmonary vein morphology interpretation
by the cardiologist is attached.
CLINICAL DATA: Pre-atrial fibrillation ablation
Cardiac CTA
MEDICATIONS:
Sub lingual nitro. 4mg x 2
TECHNIQUE: The patient was scanned on a Siemens [REDACTED]ice scanner. Gantry
rotation speed was 250 msecs. Collimation was 0.6 mm. A 100 kV
prospective scan was triggered in the ascending thoracic aorta at
35-75% of the R-R interval. Average HR during the scan was 60 bpm.
The 3D data set was interpreted on a dedicated work station using
MPR, MIP and VRT modes. A total of 80cc of contrast was used.

[Series 7: ax thins · axial · 0.70mm/px · z∈[+1073,+1214]mm · 3 of 202 slices shown, 4 images]
[im 1/202  vessel]
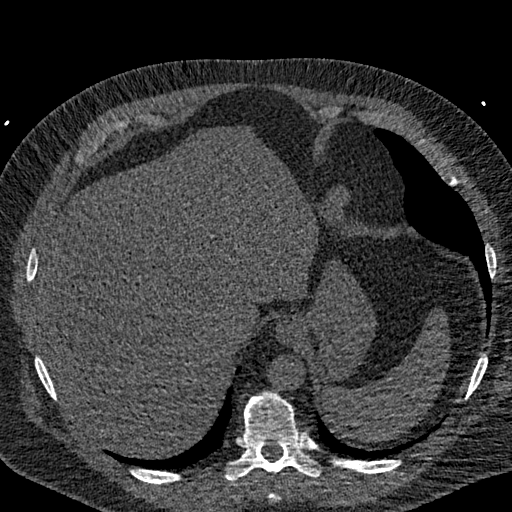
[im 1/202  lung]
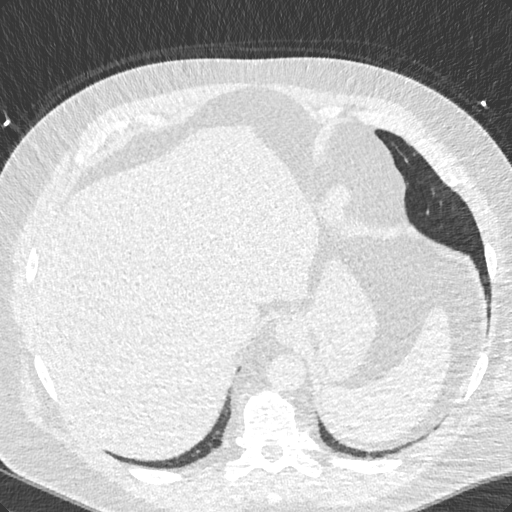
[im 101/202  vessel]
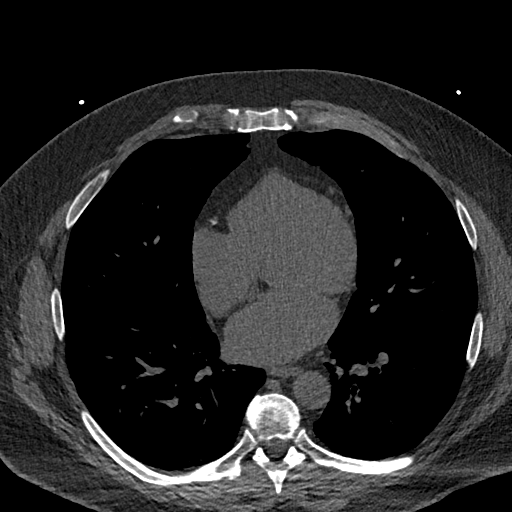
[im 202/202  vessel]
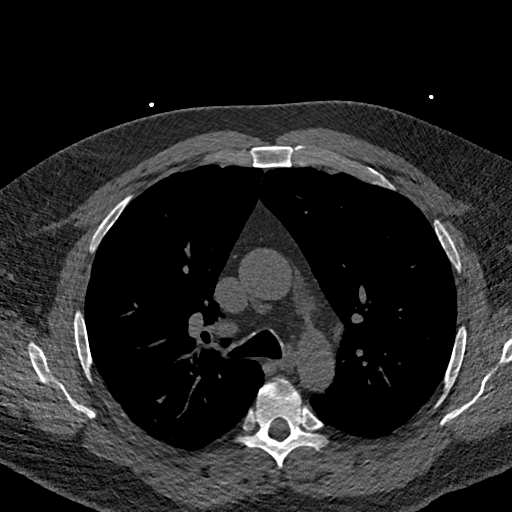

[Series 12: best diast · axial · 0.41mm/px · z∈[+1100,+1166]mm · 3 of 333 slices shown]
[im 84/333  vessel]
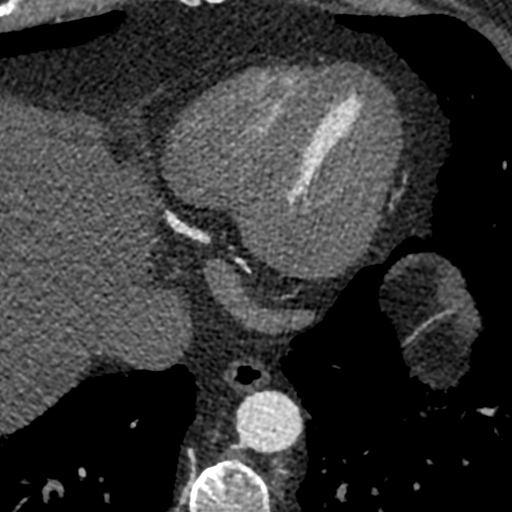
[im 167/333  vessel]
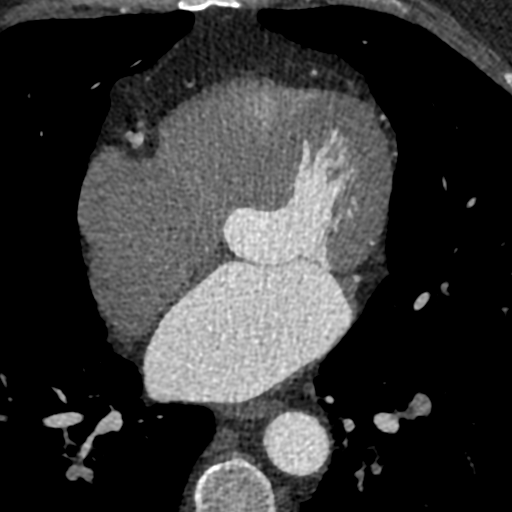
[im 250/333  vessel]
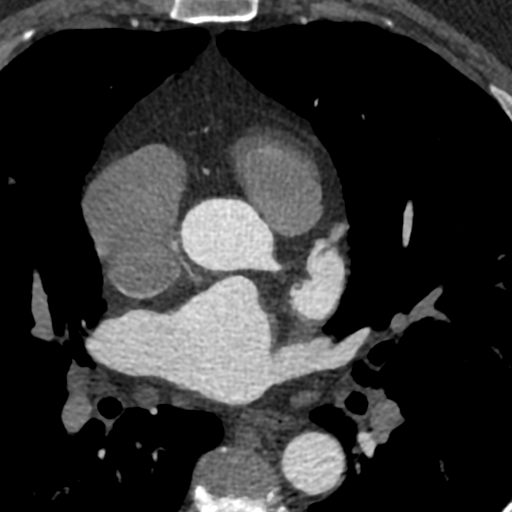

[Series 13: +300 ms · axial · 0.41mm/px · z∈[+1100,+1166]mm · 3 of 333 slices shown]
[im 84/333  vessel]
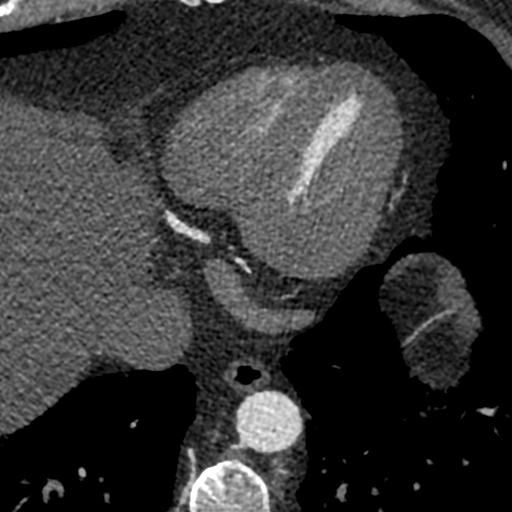
[im 167/333  vessel]
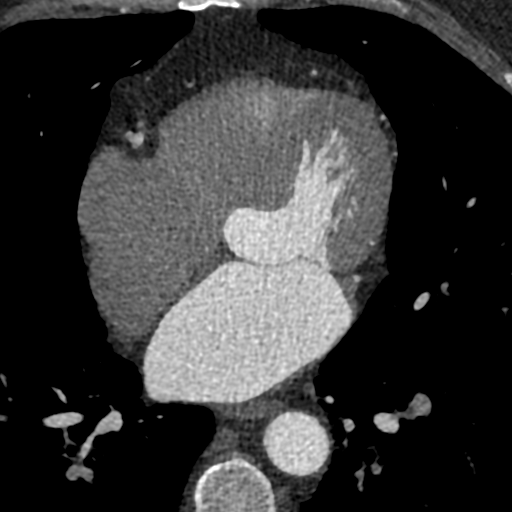
[im 250/333  vessel]
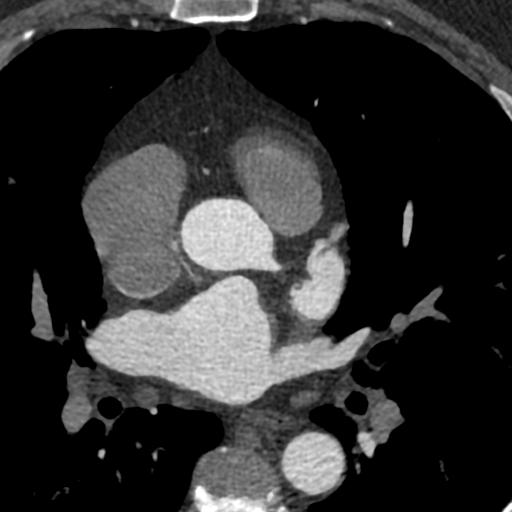

[9 of 20 positions shown; findings below may reference images not displayed]

FINDINGS: Limited view of the lung parenchyma demonstrates mild basilar
atelectasis. Airways are normal.

Limited view of the mediastinum demonstrates no adenopathy.
Esophagus normal.

Limited view of the upper abdomen unremarkable.

Limited view of the skeleton and chest wall is unremarkable.
IMPRESSION: Mild basilar atelectasis.
FINDINGS: Non-cardiac: See separate report from [REDACTED].

No left atrial appendage thrombus noted. Moderate to severe
enlargement of the left atrium. The pulmonary veins drain normally
to the left atrium. No ASD or PFO visualized.

Pulmonary veins:

LUPV 19 x 19 mm

LLPV 16 x 15 mm

RUPV 26 x 20 mm

RMPV 11 x 8 mm

RLPV 12 x 11 mm

Calcium Score: 40.5 Agatston units.

Coronary Arteries: Right dominant with no anomalies

LM: No plaque or stenosis.

LAD system:  No plaque or stenosis.

Circumflex system: Small AV LCx, no plaque or stenosis. Moderate
ramus, mixed plaque proximally with mild (<50%) stenosis.

RCA system: Mixed plaque mid RCA, no significant stenosis.
IMPRESSION: 1. Pulmonary veins drain normally to the left atrium with the above
measurements.

2.  No LA appendage thrombus noted.

3.  Nonobstructive coronary disease.

4. Coronary artery calcium score 40.5 Agatston units. This places
the patient in the 62nd percentile for age and gender, suggesting
intermediate risk for future cardiac events.

Ingunn Harpa Ronlor

*** End of Addendum ***
EXAM:
OVER-READ INTERPRETATION  CT CHEST

The following report is an over-read performed by radiologist Dr.
Amnon Tiger [REDACTED] on 12/04/2018. This
over-read does not include interpretation of cardiac or coronary
anatomy or pathology. The pulmonary vein morphology interpretation
by the cardiologist is attached.
FINDINGS: Limited view of the lung parenchyma demonstrates mild basilar
atelectasis. Airways are normal.

Limited view of the mediastinum demonstrates no adenopathy.
Esophagus normal.

Limited view of the upper abdomen unremarkable.

Limited view of the skeleton and chest wall is unremarkable.
IMPRESSION: Mild basilar atelectasis.

## 2021-07-30 LAB — CUP PACEART REMOTE DEVICE CHECK
Date Time Interrogation Session: 20230428230503
Implantable Pulse Generator Implant Date: 20220202

## 2021-07-31 ENCOUNTER — Ambulatory Visit (INDEPENDENT_AMBULATORY_CARE_PROVIDER_SITE_OTHER): Payer: BC Managed Care – PPO

## 2021-07-31 DIAGNOSIS — I441 Atrioventricular block, second degree: Secondary | ICD-10-CM

## 2021-08-08 ENCOUNTER — Encounter (HOSPITAL_COMMUNITY): Payer: Self-pay | Admitting: Nurse Practitioner

## 2021-08-08 ENCOUNTER — Ambulatory Visit (HOSPITAL_COMMUNITY)
Admission: RE | Admit: 2021-08-08 | Discharge: 2021-08-08 | Disposition: A | Discharge status: Home / Self Care | Payer: BC Managed Care – PPO | Source: Ambulatory Visit | Attending: Nurse Practitioner | Admitting: Nurse Practitioner

## 2021-08-08 VITALS — BP 132/80 | HR 79 | Ht 72.0 in | Wt 318.4 lb

## 2021-08-08 DIAGNOSIS — F17210 Nicotine dependence, cigarettes, uncomplicated: Secondary | ICD-10-CM | POA: Diagnosis not present

## 2021-08-08 DIAGNOSIS — I4819 Other persistent atrial fibrillation: Secondary | ICD-10-CM | POA: Diagnosis present

## 2021-08-08 DIAGNOSIS — Z7901 Long term (current) use of anticoagulants: Secondary | ICD-10-CM | POA: Diagnosis not present

## 2021-08-08 DIAGNOSIS — I4892 Unspecified atrial flutter: Secondary | ICD-10-CM | POA: Insufficient documentation

## 2021-08-08 DIAGNOSIS — I10 Essential (primary) hypertension: Secondary | ICD-10-CM | POA: Diagnosis not present

## 2021-08-08 DIAGNOSIS — D6869 Other thrombophilia: Secondary | ICD-10-CM

## 2021-08-08 DIAGNOSIS — Z79899 Other long term (current) drug therapy: Secondary | ICD-10-CM | POA: Insufficient documentation

## 2021-08-08 LAB — BASIC METABOLIC PANEL
Anion gap: 7 (ref 5–15)
BUN: 12 mg/dL (ref 6–20)
CO2: 24 mmol/L (ref 22–32)
Calcium: 8.6 mg/dL — ABNORMAL LOW (ref 8.9–10.3)
Chloride: 104 mmol/L (ref 98–111)
Creatinine, Ser: 1.04 mg/dL (ref 0.61–1.24)
GFR, Estimated: >60 mL/min (ref >60)
Glucose, Bld: 166 mg/dL — ABNORMAL HIGH (ref 70–99)
Potassium: 4.1 mmol/L (ref 3.5–5.1)
Sodium: 135 mmol/L (ref 135–145)

## 2021-08-08 LAB — CBC
HCT: 44.1 % (ref 39.0–52.0)
Hemoglobin: 14.7 g/dL (ref 13.0–17.0)
MCH: 30.7 pg (ref 26.0–34.0)
MCHC: 33.3 g/dL (ref 30.0–36.0)
MCV: 92.1 fL (ref 80.0–100.0)
Platelets: 311 10*3/uL (ref 150–400)
RBC: 4.79 MIL/uL (ref 4.22–5.81)
RDW: 13.3 % (ref 11.5–15.5)
WBC: 8.6 10*3/uL (ref 4.0–10.5)
nRBC: 0 % (ref 0.0–0.2)

## 2021-08-08 LAB — MAGNESIUM: Magnesium: 2.1 mg/dL (ref 1.7–2.4)

## 2021-08-08 NOTE — Progress Notes (Signed)
Patient ID: Sean Morales, male   DOB: May 09, 1960, 61 y.o.   MRN: [id number] ? ? ? ? ?Primary Care Physician: Sharilyn Sites, MD ?Referring Physician:  Dr. Rayann Heman ?EP: Dr. Rayann Heman ? ? ?Sean Morales is a 61 y.o. male with a h/o persistent afib on tikosyn therapy/xarelto. He has done well since 2016, when loaded on Tikosyn   until the last several weeks when he went back into afib/flutter. He had amiodarone in late 2015 and failed cardioversion. He had been persistent afib for a year at that time. He was seen by Dr. Rayann Heman 05/2014 who initiated tikosyn.  ? ?He returned to the clinic  for f/u cardioversion 06/03/18.Marland Kitchen He had SR for about 10 mins and then returned to afib. Other options discussed and he wanted to proceed with ablation. . Is very religious to wear cpap for treatment of OSA. Struggles with weight loss. Symptomatic with fatigue and shortness of breath in afib. ? ?F/u in afib clinic one month past ablation. He has been staying in [place]. He did have bruising at the groin   that has resolved. Sore throat right after ablation that has also resolved. He continues to smoke 1 pack a day. He has gained 10 lbs.  He is trying to walk 5000 steps daily but has felt that he is still winded with exertion. He felt after ablation that he would feel better very quickly Today, I discussed Coumadin as well as novel anticoagulants including Pradaxa, Xarelto, Savaysa, and Eliquis today as indicated for risk reduction in stroke and systemic emboli with nonvalvular atrial fibrillation.  Risks, benefits, and alternatives to each of these drugs were discussed at length today. PND/orthopnea. ? ?F/u in afib clinic, 05/05/19. He asked to be seen this am as he has not felt well for around 2-3 weeks. He has noted headaches/ extreme fatgiue/ fluttering of the heart that would only last minutes. His  symptoms have improved but linger. He  was exposed to covid 03/30/19 from his adult daughter. 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 He  did not notice any sinus issues or loss of smell/taste. He is SR today. Weight around the same. BP elevated this am as he did not take his am meds yet. Continues on xarelto 20 mg daily for at least 1.  ? ?F/u in afib clinic, 8/23. He has not noted any afib. He remains on dofetilide after his ablation 12/09/18. He has had both covid shots. He has not noted any bleeding on xarelto for a CHA2DS2VASc score of 1. He wishes to stay on anticoagulation for family history of stroke and tikosyn, as he has stressful issues going on with the sale of a house and does not want to risk going into afib at this time.  ? ?F/u in afib clinic, 02/07/21. He remains in SR on dofetilide. Afib is staying quiet. Continues on xarelto. Has had some  intermittent nose bleeds.  ? ?F/u 08/08/21 for Tikosyn surveillance. He has not noted any afib. Complaint with Tikosyn and xarelto. Qt stable. ? ?Today, he denies symptoms  of shortness of breath, orthopnea, PND, lower extremity edema, dizziness, presyncope, syncope, or neurologic sequela.Positive of symptoms as  listed above.The patient is tolerating medications without difficulties and is otherwise without complaint today.  ? ?Past Medical History:  ?Diagnosis Date  ? Bilateral knee pain   ? Depression   ? no  current meds  ? DJD (degenerative joint disease)   ? First degree heart block   ? GERD (gastroesophageal reflux disease)   ? Hypertension   ? OA (osteoarthritis) of knee   ? bilateral  ? Obstructive sleep apnea   ? Persistent atrial fibrillation ([place]) first dx 10/ 2015-- previous primary cardiologist, [name]  ? followed by atrial fib. clinic-- [name] NP  ? ?Past Surgical History:  ?Procedure Laterality Date  ? APPENDECTOMY  age 16  ? ATRIAL FIBRILLATION ABLATION N/A 12/09/2018  ? Procedure: ATRIAL FIBRILLATION ABLATION;  Surgeon: Thompson Grayer, MD;  Location: [facility];  Service: Cardiovascular;   Laterality: N/A;  ? CARDIOVASCULAR STRESS TEST  01-17-2015  [name]  ? Low risk nuclear study w/ no ischemia/  normal LV function and wal motion,  nuclear stress ef 60%  ? CARDIOVERSION N/A 01/20/2015  ? Procedure: CARDIOVERSION;  Surgeon: [name], MD;  Location: [facility];  Service: Cardiovascular;  Laterality: N/A;  ? CARDIOVERSION N/A 06/03/2018  ? Procedure: CARDIOVERSION;  Surgeon: [name], MD;  Location: [facility];  Service: Cardiovascular;  Laterality: N/A;  ? COLONOSCOPY N/A 09/20/2017  ? Procedure: COLONOSCOPY;  Surgeon: [name], MD;  Location: [facility] SUITE;  Service: Endoscopy;  Laterality: N/A;  1:30pm  ? colonscopy  age 46  ? cysts removed from nipples  in high school  ? and neck  ? implantable loop recorder placement  05/04/2020  ? Medtronic Reveal Linq model LNQ 22 (SN [id number] ) implantable loop recorder  ? ORIF RIGHT KNEE  age 76  ? POLYPECTOMY  09/20/2017  ? Procedure: POLYPECTOMY;  Surgeon: [name], MD;  Location: [facility];  Service: Endoscopy;;  ? TONSILLECTOMY  age 68  ? TOTAL KNEE ARTHROPLASTY Bilateral 05/20/2017  ? Procedure: TOTAL KNEE BILATERAL;  Surgeon: [name], MD;  Location: [facility];  Service: Orthopedics;  Laterality: Bilateral;  Bilateral Adductor Block  ? TRANSTHORACIC ECHOCARDIOGRAM  03/29/2017  ? ef 60-65%/  trivial AR, MR, and TR/  mild LAE/ moderate RAE/  PASP 17mHg  ? ? ?Current Outpatient Medications  ?Medication Sig Dispense Refill  ? acetaminophen (TYLENOL) 500 MG tablet Take 1,000 mg by mouth as needed for moderate pain.     ? amLODipine (NORVASC) 10 MG tablet TAKE 1 TABLET BY MOUTH EVERY DAY IN THE EVENING 90 tablet 3  ? amoxicillin (AMOXIL) 500 MG capsule TAKE 4 CAPSULES BY MOUTH 1 HOUR PRIOR TO APPOINTMENT    ? carvedilol (COREG) 6.25 MG tablet Take 6.25 mg by mouth 2 (two) times daily.    ? clobetasol cream (TEMOVATE) 0.05 % Apply topically 2 (two) times daily.    ? dofetilide (TIKOSYN) 500 MCG capsule TAKE 1 CAPSULE BY  MOUTH TWICE A DAY 60 capsule 5  ? ibuprofen (ADVIL,MOTRIN) 200 MG tablet Take 400-800 mg by mouth as needed for headache or moderate pain.     ? Magnesium 250 MG TABS Take 250 mg by mouth daily.     ? omeprazole (PRILOSEC) 40 MG capsule Take 40 mg by mouth daily.     ? spironolactone (ALDACTONE) 50 MG tablet Take 50 mg by mouth daily.    ? telmisartan (MICARDIS) 80 MG tablet Take 80 mg by mouth daily.    ? XARELTO 20 MG TABS tablet TAKE 1 TABLET BY MOUTH DAILY WITH SUPPER. 90 tablet 1  ? ?No current facility-administered medications for this encounter.  ? ? ?Allergies  ?Allergen Reactions  ?  Bee Venom Anaphylaxis  ?  [place]  ? Other   ?  Co pyronil per patient not sure  ? Penicillins Other (See Comments)  ?  Unknown. Childhood allergy. ?Has patient had a PCN reaction causing immediate rash, facial/tongue/throat swelling, SOB or lightheadedness with hypotension: Unknown ?Has patient had a PCN reaction causing severe rash involving mucus membranes or skin necrosis: Unknown ?Has patient had a PCN reaction that required hospitalization: Unknown ?Has patient had a PCN reaction occurring within the last 10 years: No ?If all of the above answers are "NO", then may proceed with Cephalosporin use. ?  ? Sulfa Antibiotics Other (See Comments)  ?  Unknown. Childhood allergy.  ? Tetracyclines & Related Other (See Comments)  ?  Unknown. Childhood allergy.  ? ? ?Social History  ? ?Socioeconomic History  ? Marital status: Married  ?  Spouse name: [name]  ? Number of children: 4  ? Years of education: 34  ? Highest education level: Not on file  ?Occupational History  ?  Comment: [employer]  ?Tobacco Use  ? Smoking status: Every Day  ?  Packs/day: 1.00  ?  Years: 40.00  ?  Pack years: 40.00  ?  Types: Cigarettes  ?  Start date: 01/08/1977  ? Smokeless tobacco: Never  ? Tobacco comments:  ?  1 pack daily  ?Vaping Use  ? Vaping Use: Never used  ?Substance and Sexual Activity  ? Alcohol use: Yes  ?  Alcohol/week: 1.0 -  2.0 standard drink  ?  Types: 1 - 2 Cans of beer per week  ?  Comment: rarely  ? Drug use: No  ? Sexual activity: Yes  ?Other Topics Concern  ? Not on file  ?Social History Narrative  ? Lives in [place]

## 2021-08-15 NOTE — Progress Notes (Signed)
[facility] Summary Report / Loop Recorder 

## 2021-08-28 ENCOUNTER — Other Ambulatory Visit: Payer: Self-pay | Admitting: Internal Medicine

## 2021-08-28 DIAGNOSIS — I4819 Other persistent atrial fibrillation: Secondary | ICD-10-CM

## 2021-08-29 ENCOUNTER — Other Ambulatory Visit (HOSPITAL_COMMUNITY): Payer: Self-pay | Admitting: *Deleted

## 2021-08-29 NOTE — Telephone Encounter (Signed)
Prescription refill request for Xarelto received.  Indication: afib  Last office visit:[place], 11/03/2020 Weight: 144.4 kg  Age: 61 yo  Scr: 1.04, 08/08/2021 CrCl: 153 ml/min   Refill sent.

## 2021-09-04 ENCOUNTER — Ambulatory Visit (INDEPENDENT_AMBULATORY_CARE_PROVIDER_SITE_OTHER): Payer: BC Managed Care – PPO

## 2021-09-04 DIAGNOSIS — I440 Atrioventricular block, first degree: Secondary | ICD-10-CM

## 2021-09-04 LAB — CUP PACEART REMOTE DEVICE CHECK
Date Time Interrogation Session: 20230531230233
Implantable Pulse Generator Implant Date: 20220202

## 2021-09-21 NOTE — Progress Notes (Signed)
[facility] Summary Report / Loop Recorder 

## 2021-10-07 LAB — CUP PACEART REMOTE DEVICE CHECK
Date Time Interrogation Session: 20230703230457
Implantable Pulse Generator Implant Date: 20220202

## 2021-10-09 ENCOUNTER — Ambulatory Visit (INDEPENDENT_AMBULATORY_CARE_PROVIDER_SITE_OTHER): Payer: BC Managed Care – PPO

## 2021-10-09 DIAGNOSIS — I4819 Other persistent atrial fibrillation: Secondary | ICD-10-CM

## 2021-11-07 ENCOUNTER — Other Ambulatory Visit (HOSPITAL_COMMUNITY): Payer: Self-pay | Admitting: Nurse Practitioner

## 2021-11-08 NOTE — Progress Notes (Signed)
[facility] Summary Report / Loop Recorder 

## 2021-11-09 LAB — CUP PACEART REMOTE DEVICE CHECK
Date Time Interrogation Session: 20230805230805
Implantable Pulse Generator Implant Date: 20220202

## 2021-12-05 ENCOUNTER — Other Ambulatory Visit (HOSPITAL_COMMUNITY): Payer: Self-pay | Admitting: Internal Medicine

## 2021-12-06 NOTE — Telephone Encounter (Signed)
This is a A-Fib clinic pt 

## 2021-12-18 ENCOUNTER — Ambulatory Visit (INDEPENDENT_AMBULATORY_CARE_PROVIDER_SITE_OTHER): Payer: BC Managed Care – PPO

## 2021-12-18 DIAGNOSIS — Z4509 Encounter for adjustment and management of other cardiac device: Secondary | ICD-10-CM

## 2021-12-18 DIAGNOSIS — I4819 Other persistent atrial fibrillation: Secondary | ICD-10-CM

## 2021-12-19 LAB — CUP PACEART REMOTE DEVICE CHECK
Date Time Interrogation Session: 20230917230404
Implantable Pulse Generator Implant Date: 20220202

## 2021-12-21 ENCOUNTER — Ambulatory Visit (INDEPENDENT_AMBULATORY_CARE_PROVIDER_SITE_OTHER): Payer: BC Managed Care – PPO | Admitting: Podiatry

## 2021-12-21 ENCOUNTER — Encounter: Payer: Self-pay | Admitting: Podiatry

## 2021-12-21 ENCOUNTER — Ambulatory Visit (INDEPENDENT_AMBULATORY_CARE_PROVIDER_SITE_OTHER): Payer: BC Managed Care – PPO

## 2021-12-21 DIAGNOSIS — M25571 Pain in right ankle and joints of right foot: Secondary | ICD-10-CM

## 2021-12-21 DIAGNOSIS — M7661 Achilles tendinitis, right leg: Secondary | ICD-10-CM

## 2021-12-21 DIAGNOSIS — M79671 Pain in right foot: Secondary | ICD-10-CM

## 2021-12-21 NOTE — Patient Instructions (Signed)

## 2021-12-21 NOTE — Progress Notes (Signed)
  Subjective:  Patient ID: Sean Morales, male    DOB: 10/18/1960,   MRN: [id number]  Chief Complaint  Patient presents with   Foot Pain    Foot pain - on going for 3 months     61 y.o. male presents for concern of right foot pain that has been going on for 3 months. He relates for two months he could not walk on the foot. Relates now there is not much pain but there is some at the base of his ankle. Relates rest helps. He is not diabetic.  Marland Kitchen Denies any other pedal complaints. Denies n/v/f/c.   Past Medical History:  Diagnosis Date   Bilateral knee pain    Depression    no current meds   DJD (degenerative joint disease)    First degree heart block    GERD (gastroesophageal reflux disease)    Hypertension    OA (osteoarthritis) of knee    bilateral   Obstructive sleep apnea    Persistent atrial fibrillation ([place]) first dx 10/ 2015-- previous primary cardiologist, [name]   followed by atrial fib. clinic-- [name] NP    Objective:  Physical Exam: Vascular: DP/PT pulses 2/4 bilateral. CFT <3 seconds. Normal hair growth on digits. No edema.  Skin. No lacerations or abrasions bilateral feet.  Musculoskeletal: MMT 5/5 bilateral lower extremities in DF, PF, Inversion and Eversion. Deceased ROM in DF of ankle joint. Tender to watershed area of right achilles. Mild pain with DF no pain with PF. No pain to achilles insertion or otherwise on heel.  Neurological: Sensation intact to light touch.   Assessment:   1. Tendonitis, Achilles, right      Plan:  Patient was evaluated and treated and all questions answered. X-rays reviewed and discussed with patient. No acute fractures or dislocations noted.   -Discussed Achilles insertional tendonitis and treatment options with patient.  -Discussed stretching exercises. -Anti-inflammatories as needed.  -Heel lifts provided and discussed proper shoewear.  -Discussed if no improvement will consider MRI/PT/EPAT/PRP injections.   -Patient to return to office as needed or sooner if condition worsens.   Lorenda Peck, DPM

## 2021-12-30 NOTE — Progress Notes (Signed)
[facility] Summary Report / Loop Recorder 

## 2022-01-22 ENCOUNTER — Ambulatory Visit (INDEPENDENT_AMBULATORY_CARE_PROVIDER_SITE_OTHER): Payer: BC Managed Care – PPO

## 2022-01-22 DIAGNOSIS — I4819 Other persistent atrial fibrillation: Secondary | ICD-10-CM | POA: Diagnosis not present

## 2022-01-23 LAB — CUP PACEART REMOTE DEVICE CHECK
Date Time Interrogation Session: 20231020230421
Implantable Pulse Generator Implant Date: 20220202

## 2022-02-03 ENCOUNTER — Other Ambulatory Visit: Payer: Self-pay | Admitting: Internal Medicine

## 2022-02-03 DIAGNOSIS — I4819 Other persistent atrial fibrillation: Secondary | ICD-10-CM

## 2022-02-05 NOTE — Telephone Encounter (Signed)
Prescription refill request for Xarelto received.  Indication:afib Last office visit:5/23 Weight:144.4 kg Age:61 Scr:1.0 CrCl:158.44 ml/min  Prescription refilled

## 2022-02-08 ENCOUNTER — Ambulatory Visit (HOSPITAL_COMMUNITY)
Admission: RE | Admit: 2022-02-08 | Discharge: 2022-02-08 | Disposition: A | Discharge status: Home / Self Care | Payer: BC Managed Care – PPO | Source: Ambulatory Visit | Attending: Nurse Practitioner | Admitting: Nurse Practitioner

## 2022-02-08 ENCOUNTER — Encounter (HOSPITAL_COMMUNITY): Payer: Self-pay | Admitting: Nurse Practitioner

## 2022-02-08 VITALS — BP 130/86 | HR 82 | Ht 72.0 in | Wt 315.8 lb

## 2022-02-08 DIAGNOSIS — G4733 Obstructive sleep apnea (adult) (pediatric): Secondary | ICD-10-CM | POA: Diagnosis not present

## 2022-02-08 DIAGNOSIS — D6869 Other thrombophilia: Secondary | ICD-10-CM | POA: Diagnosis not present

## 2022-02-08 DIAGNOSIS — I10 Essential (primary) hypertension: Secondary | ICD-10-CM | POA: Diagnosis not present

## 2022-02-08 DIAGNOSIS — F17210 Nicotine dependence, cigarettes, uncomplicated: Secondary | ICD-10-CM | POA: Diagnosis not present

## 2022-02-08 DIAGNOSIS — I4819 Other persistent atrial fibrillation: Secondary | ICD-10-CM | POA: Diagnosis present

## 2022-02-08 DIAGNOSIS — I4892 Unspecified atrial flutter: Secondary | ICD-10-CM | POA: Insufficient documentation

## 2022-02-08 DIAGNOSIS — Z79899 Other long term (current) drug therapy: Secondary | ICD-10-CM | POA: Insufficient documentation

## 2022-02-08 DIAGNOSIS — Z7901 Long term (current) use of anticoagulants: Secondary | ICD-10-CM | POA: Diagnosis not present

## 2022-02-08 LAB — MAGNESIUM: Magnesium: 2.2 mg/dL (ref 1.7–2.4)

## 2022-02-08 NOTE — Progress Notes (Signed)
Patient ID: Sean Morales, male   DOB: [birth year], 61 y.o.   MRN: [id number]     Primary Care Physician: Sharilyn Sites, MD Referring Physician:  Dr. Rayann Heman EP: Dr. Merlene Morse is a 61 y.o. male with a h/o persistent afib on tikosyn therapy/xarelto. He has done well since 2016, when loaded on Tikosyn   until the last several weeks when he went back into afib/flutter. He had amiodarone in late 2015 and failed cardioversion. He had been persistent afib for a year at that time. He was seen by Dr. Rayann Heman 05/2014 who initiated tikosyn.   He returned to the clinic  for f/u cardioversion 06/03/18.Marland Kitchen He had SR for about 10 mins and then returned to afib. Other options discussed and he wanted to proceed with ablation. . Is very religious to wear cpap for treatment of OSA. Struggles with weight loss. Symptomatic with fatigue and shortness of breath in afib.  F/u in afib clinic one month past ablation. He has been staying in [place]. He did have bruising at the groin   that has resolved. Sore throat right after ablation that has also resolved. He continues to smoke 1 pack a day. He has gained 10 lbs.  He is trying to walk 5000 steps daily but has felt that he is still winded with exertion. He felt after ablation that he would feel better very quickly Today, I discussed Coumadin as well as novel anticoagulants including Pradaxa, Xarelto, Savaysa, and Eliquis today as indicated for risk reduction in stroke and systemic emboli with nonvalvular atrial fibrillation.  Risks, benefits, and alternatives to each of these drugs were discussed at length today. PND/orthopnea.  F/u in afib clinic, 05/05/19. He asked to be seen this am as he has not felt well for around 2-3 weeks. He has noted headaches/ extreme fatgiue/ fluttering of the heart that would only last minutes. His  symptoms have improved but linger. He  was exposed to covid 03/30/19 from his adult daughter. 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 He  did not notice any sinus issues or loss of smell/taste. He is SR today. Weight around the same. BP elevated this am as he did not take his am meds yet. Continues on xarelto 20 mg daily for at least 1.   F/u in afib clinic, 8/23. He has not noted any afib. He remains on dofetilide after his ablation 12/09/18. He has had both covid shots. He has not noted any bleeding on xarelto for a CHA2DS2VASc score of 1. He wishes to stay on anticoagulation for family history of stroke and tikosyn, as he has stressful issues going on with the sale of a house and does not want to risk going into afib at this time.   F/u in afib clinic, 02/07/21. He remains in SR on dofetilide. Afib is staying quiet. Continues on xarelto. Has had some  intermittent nose bleeds.   F/u 08/08/21 for Tikosyn surveillance. He has not noted any afib. Complaint with Tikosyn and xarelto. Qt stable.  F/u in afib clinic, 11/0/23 for Tikosyn surveillance. He reports he has been staying in [place]. 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 No issues with anticoagulation. His  K+ was checked with PCP late October and was 4.6, mag drawn  today.  Today, he denies symptoms  of shortness of breath, orthopnea, PND, lower extremity edema, dizziness, presyncope, syncope, or neurologic sequela.Positive of symptoms as  listed above.The patient is tolerating medications without difficulties and is otherwise without complaint today.   Past Medical History:  Diagnosis Date   Bilateral knee pain    Depression    no current meds   DJD (degenerative joint disease)    First degree heart block    GERD (gastroesophageal reflux disease)    Hypertension    OA (osteoarthritis) of knee    bilateral   Obstructive sleep apnea    Persistent atrial fibrillation ([place]) first dx 10/ 2015-- previous primary cardiologist, dr  Bronson Ing   followed by atrial fib. clinic-- [name] NP   Past Surgical History:  Procedure Laterality Date   APPENDECTOMY  age 89   ATRIAL FIBRILLATION ABLATION N/A 12/09/2018   Procedure: [place];  Surgeon: Thompson Grayer, MD;  Location: [place] [facility];  Service: Cardiovascular;  Laterality: N/A;   CARDIOVASCULAR STRESS TEST  01-17-2015  [name]   Low risk nuclear study w/ no ischemia/  normal LV function and wal motion,  nuclear stress ef 60%   CARDIOVERSION N/A 01/20/2015   Procedure: CARDIOVERSION;  Surgeon: [name], MD;  Location: [facility];  Service: Cardiovascular;  Laterality: N/A;   CARDIOVERSION N/A 06/03/2018   Procedure: CARDIOVERSION;  Surgeon: [name], MD;  Location: [facility];  Service: Cardiovascular;  Laterality: N/A;   COLONOSCOPY N/A 09/20/2017   Procedure: COLONOSCOPY;  Surgeon: [name], MD;  Location: [facility];  Service: Endoscopy;  Laterality: N/A;  1:30pm   colonscopy  age 78   cysts removed from nipples  in high school   and neck   implantable loop recorder placement  05/04/2020   Medtronic Reveal Linq model LNQ 22 (SN [id number] ) implantable loop recorder   ORIF RIGHT KNEE  age 11   POLYPECTOMY  09/20/2017   Procedure: POLYPECTOMY;  Surgeon: [name], MD;  [facility]: [facility];  Service: Endoscopy;;   TONSILLECTOMY  age 42   TOTAL KNEE ARTHROPLASTY Bilateral 05/20/2017   Procedure: TOTAL KNEE BILATERAL;  Surgeon: [name], MD;  Location: [facility];  Service: Orthopedics;  Laterality: Bilateral;  Bilateral Adductor Block   TRANSTHORACIC ECHOCARDIOGRAM  03/29/2017   ef 60-65%/  trivial AR, MR, and TR/  mild LAE/ moderate RAE/  PASP 78mHg    Current Outpatient Medications  Medication Sig Dispense Refill   acetaminophen (TYLENOL) 500 MG tablet Take 1,000 mg by mouth as needed for moderate pain.      amLODipine (NORVASC) 10 MG tablet TAKE 1 TABLET BY MOUTH EVERY DAY IN THE EVENING 90  tablet 3   amoxicillin (AMOXIL) 500 MG capsule TAKE 4 CAPSULES BY MOUTH 1 HOUR PRIOR TO APPOINTMENT     carvedilol (COREG) 6.25 MG tablet Take 6.25 mg by mouth 2 (two) times daily.     dofetilide (TIKOSYN) 500 MCG capsule TAKE 1 CAPSULE BY MOUTH TWICE A DAY 60 capsule 5   ibuprofen (ADVIL,MOTRIN) 200 MG tablet Take 400-800 mg by mouth as needed for headache or moderate pain.      losartan (COZAAR) 100 MG tablet Take 1 tablet by mouth daily.     Magnesium 250 MG TABS Take 250 mg by mouth daily.      omeprazole (PRILOSEC) 40 MG capsule Take 40 mg by mouth daily.      spironolactone (ALDACTONE) 50  MG tablet Take 50 mg by mouth daily.     telmisartan (MICARDIS) 80 MG tablet Take 80 mg by mouth daily.     XARELTO 20 MG TABS tablet TAKE 1 TABLET BY MOUTH DAILY WITH SUPPER 90 tablet 1   No current facility-administered medications for this encounter.    Allergies  Allergen Reactions   Bee Venom Anaphylaxis    Japanese Hornet   Other     Co pyronil per patient not sure   Penicillins Other (See Comments)    Unknown. Childhood allergy. Has patient had a PCN reaction causing immediate rash, facial/tongue/throat swelling, SOB or lightheadedness with hypotension: Unknown Has patient had a PCN reaction causing severe rash involving mucus membranes or skin necrosis: Unknown Has patient had a PCN reaction that required hospitalization: Unknown Has patient had a PCN reaction occurring within the last 10 years: No If all of the above answers are "NO", then may proceed with Cephalosporin use.    Sulfa Antibiotics Other (See Comments)    Unknown. Childhood allergy.   Tetracyclines & Related Other (See Comments)    Unknown. Childhood allergy.    Social History   Socioeconomic History   Marital status: Married    Spouse name: [name]   Number of children: 4   Years of education: 14   Highest education level: Not on file  Occupational History    Comment: Transport planner [employer]  Tobacco Use    Smoking status: Every Day    Packs/day: 1.00    Years: 40.00    Total pack years: 40.00    Types: Cigarettes    Start date: 01/08/1977   Smokeless tobacco: Never   Tobacco comments:    1 pack daily  Vaping Use   Vaping Use: Never used  Substance and Sexual Activity   Alcohol use: Yes    Alcohol/week: 1.0 - 2.0 standard drink of alcohol    Types: 1 - 2 Cans of beer per week    Comment: rarely   Drug use: No   Sexual activity: Yes  Other Topics Concern   Not on file  Social History Narrative   L[place] in [place] with wife.   Patient 1 cup of coffee daily,occas. 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      Social Determinants of Radio broadcast assistant Strain: Not on [employer] Insecurity: Not on file  Transportation Needs: Not on file  Physical Activity: Not on file  Stress: Not on file  Social Connections: Not on file  Intimate Partner Violence: Not on file    Family History  Problem Relation Age of Onset   Deep vein thrombosis Father    Stroke Mother    Colon cancer Neg Hx     ROS- All systems are reviewed and negative except as per the HPI above  Physical Exam: Vitals:   02/08/22 1024  BP: 130/86  Pulse: 82  Weight: (!) 143.2 kg  Height: 6' (1.829 m)   Pulse Ox 97%  GEN- The patient is well appearing, alert and oriented x 3 today.   Head- normocephalic, atraumatic Eyes-  Sclera clear, conjunctiva pink Ears- hearing intact Oropharynx- clear Neck- supple, no JVP Lymph- no cervical lymphadenopathy Lungs- Clear to ausculation bilaterally, normal work of breathing Heart- regular rate and rhythm, no murmurs, rubs or gallops, PMI not laterally displaced GI- soft, NT, ND, + BS Extremities- no clubbing, cyanosis, or edema MS- no significant deformity  or atrophy Skin- no rash or lesion Psych- euthymic mood, full affect Neuro- strength and sensation are intact  EKG-  Vent. rate 82 BPM PR  interval 296 ms QRS duration 80 ms QT/QTcB 392/457 ms P-R-T axes -34 -10 54 Unusual P axis, possible ectopic atrial rhythm Abnormal ECG When compared with ECG of 08-Aug-2021 13:52, PREVIOUS ECG IS P   - Left ventricle: The cavity size was normal. Wall thickness was   normal. Systolic function was normal. The estimated ejection   fraction was in the range of 60% to 65%. Mildly reduced GLS at   -16%. Wall motion was normal; there were no regional wall motion   abnormalities. Left ventricular diastolic function parameters   were normal. - Aortic valve: Trileaflet. Sclerosis without stenosis. There was   trivial regurgitation. - Mitral valve: Mildly thickened leaflets . There was trivial   regurgitation. - Left atrium: The atrium was mildly dilated. - Right atrium: Moderately dilated. - Tricuspid valve: There was trivial regurgitation. - Pulmonary arteries: PA peak pressure: 33 mm Hg (S). - Inferior vena cava: The vessel was dilated. The respirophasic   diameter changes were blunted (< 50%), consistent with elevated   central venous pressure.   Impressions:   - Compared to a prior study in 2015, the LVEF is higher at 29-56% -   diastolic function is normal. There is moderate RAE and mild LAE.  Assessment and Plan: 1. Persistent afib/flutter S/p ablation 12/2018 Doing well staying in [place]  Continue tikosyn 500 mcg bid Continue carvedilol 6.25 mg bid  Continue xarelto for chadsvasc score of at least 1  He wishes to stay on DOAC because of family history    2. HTN Stable   F/u with afib clinic in 6 months for tikosyn surveillance   Butch Penny C. Tiwan Schnitker, [facility] [address] [place], [place] [address] [phone]

## 2022-02-16 NOTE — Progress Notes (Signed)
[facility] Summary Report / Loop Recorder 

## 2022-02-26 ENCOUNTER — Ambulatory Visit (INDEPENDENT_AMBULATORY_CARE_PROVIDER_SITE_OTHER): Payer: BC Managed Care – PPO

## 2022-02-26 DIAGNOSIS — I4819 Other persistent atrial fibrillation: Secondary | ICD-10-CM

## 2022-02-27 LAB — CUP PACEART REMOTE DEVICE CHECK
Date Time Interrogation Session: 20231126230928
Implantable Pulse Generator Implant Date: 20220202

## 2022-04-03 ENCOUNTER — Ambulatory Visit (INDEPENDENT_AMBULATORY_CARE_PROVIDER_SITE_OTHER): Payer: BC Managed Care – PPO

## 2022-04-03 DIAGNOSIS — I4819 Other persistent atrial fibrillation: Secondary | ICD-10-CM | POA: Diagnosis not present

## 2022-04-03 LAB — CUP PACEART REMOTE DEVICE CHECK
Date Time Interrogation Session: 20240101231220
Implantable Pulse Generator Implant Date: 20220202

## 2022-04-09 NOTE — Progress Notes (Signed)
[facility] Summary Report / Loop Recorder 

## 2022-05-03 NOTE — Progress Notes (Signed)
[facility] Summary Report / Loop Recorder

## 2022-05-06 ENCOUNTER — Other Ambulatory Visit (HOSPITAL_COMMUNITY): Payer: Self-pay | Admitting: Nurse Practitioner

## 2022-05-06 LAB — CUP PACEART REMOTE DEVICE CHECK
Date Time Interrogation Session: 20240203230446
Implantable Pulse Generator Implant Date: 20220202

## 2022-05-07 ENCOUNTER — Ambulatory Visit: Payer: BC Managed Care – PPO

## 2022-05-07 DIAGNOSIS — I4819 Other persistent atrial fibrillation: Secondary | ICD-10-CM | POA: Diagnosis not present

## 2022-05-10 ENCOUNTER — Encounter (HOSPITAL_COMMUNITY): Payer: Self-pay | Admitting: *Deleted

## 2022-06-11 ENCOUNTER — Ambulatory Visit (INDEPENDENT_AMBULATORY_CARE_PROVIDER_SITE_OTHER): Payer: BC Managed Care – PPO

## 2022-06-11 DIAGNOSIS — I4819 Other persistent atrial fibrillation: Secondary | ICD-10-CM | POA: Diagnosis not present

## 2022-06-12 LAB — CUP PACEART REMOTE DEVICE CHECK
Date Time Interrogation Session: 20240310230157
Implantable Pulse Generator Implant Date: 20220202

## 2022-06-20 NOTE — Progress Notes (Signed)
[facility] Summary Report / Loop Recorder 

## 2022-07-16 ENCOUNTER — Ambulatory Visit (INDEPENDENT_AMBULATORY_CARE_PROVIDER_SITE_OTHER): Payer: BC Managed Care – PPO

## 2022-07-16 DIAGNOSIS — I4819 Other persistent atrial fibrillation: Secondary | ICD-10-CM | POA: Diagnosis not present

## 2022-07-16 LAB — CUP PACEART REMOTE DEVICE CHECK
Date Time Interrogation Session: 20240414230238
Implantable Pulse Generator Implant Date: 20220202

## 2022-07-19 NOTE — Progress Notes (Signed)
[facility] Summary Report / Loop Recorder 

## 2022-08-16 ENCOUNTER — Ambulatory Visit (HOSPITAL_COMMUNITY)
Admission: RE | Admit: 2022-08-16 | Discharge: 2022-08-16 | Disposition: A | Discharge status: Home / Self Care | Payer: BC Managed Care – PPO | Source: Ambulatory Visit | Attending: Physician Assistant | Admitting: Physician Assistant

## 2022-08-16 VITALS — BP 120/84 | HR 74 | Ht 72.0 in | Wt 307.6 lb

## 2022-08-16 DIAGNOSIS — Z5181 Encounter for therapeutic drug level monitoring: Secondary | ICD-10-CM

## 2022-08-16 DIAGNOSIS — F17210 Nicotine dependence, cigarettes, uncomplicated: Secondary | ICD-10-CM | POA: Insufficient documentation

## 2022-08-16 DIAGNOSIS — I4819 Other persistent atrial fibrillation: Secondary | ICD-10-CM

## 2022-08-16 DIAGNOSIS — Z7901 Long term (current) use of anticoagulants: Secondary | ICD-10-CM | POA: Insufficient documentation

## 2022-08-16 DIAGNOSIS — Z79899 Other long term (current) drug therapy: Secondary | ICD-10-CM

## 2022-08-16 DIAGNOSIS — I10 Essential (primary) hypertension: Secondary | ICD-10-CM | POA: Diagnosis not present

## 2022-08-16 DIAGNOSIS — I4892 Unspecified atrial flutter: Secondary | ICD-10-CM | POA: Insufficient documentation

## 2022-08-16 LAB — CBC
HCT: 46.5 % (ref 39.0–52.0)
Hemoglobin: 15.2 g/dL (ref 13.0–17.0)
MCH: 29.9 pg (ref 26.0–34.0)
MCHC: 32.7 g/dL (ref 30.0–36.0)
MCV: 91.4 fL (ref 80.0–100.0)
Platelets: 254 10*3/uL (ref 150–400)
RBC: 5.09 MIL/uL (ref 4.22–5.81)
RDW: 13.5 % (ref 11.5–15.5)
WBC: 9.9 10*3/uL (ref 4.0–10.5)
nRBC: 0 % (ref 0.0–0.2)

## 2022-08-16 LAB — MAGNESIUM: Magnesium: 2 mg/dL (ref 1.7–2.4)

## 2022-08-16 LAB — BASIC METABOLIC PANEL
Anion gap: 10 (ref 5–15)
BUN: 14 mg/dL (ref 8–23)
CO2: 27 mmol/L (ref 22–32)
Calcium: 9.3 mg/dL (ref 8.9–10.3)
Chloride: 101 mmol/L (ref 98–111)
Creatinine, Ser: 1.12 mg/dL (ref 0.61–1.24)
GFR, Estimated: >60 mL/min (ref >60)
Glucose, Bld: 114 mg/dL — ABNORMAL HIGH (ref 70–99)
Potassium: 4.5 mmol/L (ref 3.5–5.1)
Sodium: 138 mmol/L (ref 135–145)

## 2022-08-16 NOTE — Progress Notes (Signed)
Primary Care Physician: Assunta Found, MD Referring Physician:  Dr. Johney Frame EP: Dr. Delrae Sawyers is a 62 y.o. male with a h/o persistent afib on tikosyn therapy/xarelto. He has done well since 2016, when loaded on Tikosyn   until the last several weeks when he went back into afib/flutter. He had amiodarone in late 2015 and failed cardioversion. He had been persistent afib for a year at that time. He was seen by Dr. Johney Frame 05/2014 who initiated tikosyn.   He returned to the clinic  for f/u cardioversion 06/03/18.Marland Kitchen He had SR for about 10 mins and then returned to afib. Other options discussed and he wanted to proceed with ablation. . Is very religious to wear cpap for treatment of OSA. Struggles with weight loss. Symptomatic with fatigue and shortness of breath in afib.  F/u in afib clinic one month past ablation. He has been staying in [place]. He did have bruising at the groin   that has resolved. Sore throat right after ablation that has also resolved. He continues to smoke 1 pack a day. He has gained 10 lbs.  He is trying to walk 5000 steps daily but has felt that he is still winded with exertion. He felt after ablation that he would feel better very quickly Today, I discussed Coumadin as well as novel anticoagulants including Pradaxa, Xarelto, Savaysa, and Eliquis today as indicated for risk reduction in stroke and systemic emboli with nonvalvular atrial fibrillation.  Risks, benefits, and alternatives to each of these drugs were discussed at length today. PND/orthopnea.  F/u in afib clinic, 05/05/19. He asked to be seen this am as he has not felt well for around 2-3 weeks. He has noted headaches/ extreme fatgiue/ fluttering of the heart that would only last minutes. His  symptoms have improved but linger. He  was exposed to covid 03/30/19 from his adult daughter. 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 He  did not  notice any sinus issues or loss of smell/taste. He is SR today. Weight around the same. BP elevated this am as he did not take his am meds yet. Continues on xarelto 20 mg daily for at least 1.   F/u in afib clinic, 8/23. He has not noted any afib. He remains on dofetilide after his ablation 12/09/18. He has had both covid shots. He has not noted any bleeding on xarelto for a CHA2DS2VASc score of 1. He wishes to stay on anticoagulation for family history of stroke and tikosyn, as he has stressful issues going on with the sale of a house and does not want to risk going into afib at this time.   F/u in afib clinic, 02/07/21. He remains in SR on dofetilide. Afib is staying quiet. Continues on xarelto. Has had some  intermittent nose bleeds.   F/u 08/08/21 for Tikosyn surveillance. He has not noted any afib. Complaint with Tikosyn and xarelto. Qt stable.  F/u in afib clinic, 11/0/23 for Tikosyn surveillance. He reports he has been staying in [place]. 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 No issues with anticoagulation. His  K+ was checked with PCP late October and was 4.6, mag drawn today.  Follow up in the AF clinic 08/16/22. Patient reports that he has done well  since his last visit. He denies any interim symptoms of afib. No bleeding issues on anticoagulation.   Today, he denies symptoms of palpitations, chest pain, shortness of breath, orthopnea, PND, lower extremity edema, dizziness, presyncope, syncope, or neurologic sequela.Positive of symptoms as  listed above.The patient is tolerating medications without difficulties and is otherwise without complaint today.   Past Medical History:  Diagnosis Date   Bilateral knee pain    Depression    no current meds   DJD (degenerative joint disease)    First degree heart block    GERD (gastroesophageal reflux disease)    Hypertension    OA (osteoarthritis) of knee     bilateral   Obstructive sleep apnea    Persistent atrial fibrillation ([facility]) first dx 10/ 2015-- previous primary cardiologist, dr Purvis Sheffield   followed by atrial fib. clinic-- Sebastian Ache NP   Past Surgical History:  Procedure Laterality Date   APPENDECTOMY  age 40   ATRIAL FIBRILLATION ABLATION N/A 12/09/2018   Procedure: ATRIAL FIBRILLATION ABLATION;  Surgeon: Hillis Range, MD;  Location: [facility] LAB;  Service: Cardiovascular;  Laterality: N/A;   CARDIOVASCULAR STRESS TEST  01-17-2015  dr Purvis Sheffield   Low risk nuclear study w/ no ischemia/  normal LV function and wal motion,  nuclear stress ef 60%   CARDIOVERSION N/A 01/20/2015   Procedure: CARDIOVERSION;  Surgeon: Laqueta Linden, MD;  Location: [facility];  Service: Cardiovascular;  Laterality: N/A;   CARDIOVERSION N/A 06/03/2018   Procedure: CARDIOVERSION;  Surgeon: Quintella Reichert, MD;  Location: [facility] ENDOSCOPY;  Service: Cardiovascular;  Laterality: N/A;   COLONOSCOPY N/A 09/20/2017   Procedure: COLONOSCOPY;  Surgeon: Corbin Ade, MD;  [facility];  Service: Endoscopy;  Laterality: N/A;  1:30pm   colonscopy  age 41   cysts removed from nipples  in high school   and neck   implantable loop recorder placement  05/04/2020   Medtronic Reveal Linq model LNQ 22 (SN [id number] ) implantable loop recorder   ORIF RIGHT KNEE  age 62   POLYPECTOMY  09/20/2017   Procedure: POLYPECTOMY;  Surgeon: Corbin Ade, MD;  [facility];  Service: Endoscopy;;   TONSILLECTOMY  age 42   TOTAL KNEE ARTHROPLASTY Bilateral 05/20/2017   Procedure: TOTAL KNEE BILATERAL;  Surgeon: Durene Romans, MD;  Location: [facility];  Service: Orthopedics;  Laterality: Bilateral;  Bilateral Adductor Block   TRANSTHORACIC ECHOCARDIOGRAM  03/29/2017   ef 60-65%/  trivial AR, MR, and TR/  mild LAE/ moderate RAE/  PASP    Current Outpatient Medications  Medication Sig Dispense Refill   acetaminophen (TYLENOL) 500 MG tablet Take 1,000 mg  by mouth as needed for moderate pain.      amLODipine (NORVASC) 10 MG tablet TAKE 1 TABLET BY MOUTH EVERY DAY IN THE EVENING 90 tablet 3   atorvastatin (LIPITOR) 10 MG tablet Take 1 tablet by mouth daily.     carvedilol (COREG) 6.25 MG tablet Take 6.25 mg by mouth 2 (two) times daily.     dofetilide (TIKOSYN) 500 MCG capsule TAKE 1 CAPSULE BY MOUTH TWICE A DAY 60 capsule 5   ibuprofen (ADVIL,MOTRIN) 200 MG tablet Take 400-800 mg by mouth as needed for headache or moderate pain.      Magnesium 250 MG TABS Take 250 mg by mouth daily.      metFORMIN (GLUCOPHAGE) 500 MG tablet Take 500 mg by mouth 2 (two) times daily.     omeprazole (PRILOSEC)  40 MG capsule Take 40 mg by mouth daily.      spironolactone (ALDACTONE) 50 MG tablet Take 50 mg by mouth daily.     telmisartan (MICARDIS) 80 MG tablet Take 80 mg by mouth daily.     XARELTO 20 MG TABS tablet TAKE 1 TABLET BY MOUTH DAILY WITH SUPPER 90 tablet 1   No current facility-administered medications for this encounter.    Allergies  Allergen Reactions   Bee Venom Anaphylaxis    Japanese Hornet   Other     Co pyronil per patient not sure   Penicillins Other (See Comments)    Unknown. Childhood allergy. Has patient had a PCN reaction causing immediate rash, facial/tongue/throat swelling, SOB or lightheadedness with hypotension: Unknown Has patient had a PCN reaction causing severe rash involving mucus membranes or skin necrosis: Unknown Has patient had a PCN reaction that required hospitalization: Unknown Has patient had a PCN reaction occurring within the last 10 years: No If all of the above answers are "NO", then may proceed with Cephalosporin use.    Sulfa Antibiotics Other (See Comments)    Unknown. Childhood allergy.   Tetracyclines & Related Other (See Comments)    Unknown. Childhood allergy.    Social History   Socioeconomic History   Marital status: Married    Spouse name: Jeanice Lim   Number of children: 4   Years of education:  14   Highest education level: Not on file  Occupational History    Comment: Building surveyor [employer]  Tobacco Use   Smoking status: Every Day    Packs/day: 1.00    Years: 40.00    Additional pack years: 0.00    Total pack years: 40.00    Types: Cigarettes    Start date: 01/08/1977   Smokeless tobacco: Never   Tobacco comments:    1 pack daily  Vaping Use   Vaping Use: Never used  Substance and Sexual Activity   Alcohol use: Yes    Alcohol/week: 1.0 - 2.0 standard drink of alcohol    Types: 1 - 2 Cans of beer per week    Comment: rarely   Drug use: No   Sexual activity: Yes  Other Topics Concern   Not on file  Social History Narrative   Lives in [place] (near [place]) with wife.   Patient 1 cup of coffee daily,occas. 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      Social Determinants of Corporate investment banker Strain: Not on [employer] Insecurity: Not on file  Transportation Needs: Not on file  Physical Activity: Not on file  Stress: Not on file  Social Connections: Not on file  Intimate Partner Violence: Not on file    Family History  Problem Relation Age of Onset   Deep vein thrombosis Father    Stroke Mother    Colon cancer Neg Hx     ROS- All systems are reviewed and negative except as per the HPI above  Physical Exam: Vitals:   08/16/22 1140  BP: 120/84  Pulse: 74  Weight: (!) 139.5 kg  Height: 6' (1.829 m)    GEN- The patient is a well appearing male, alert and oriented x 3 today.   HEENT-head normocephalic, atraumatic, sclera clear, conjunctiva pink, hearing intact, trachea midline. Lungs- Clear to ausculation bilaterally, normal work of breathing Heart- Regular rate and rhythm, no murmurs, rubs or gallops  GI- soft, NT, ND, + BS Extremities-  no clubbing, cyanosis, or edema MS- no significant deformity or atrophy Skin- no rash or lesion Psych- euthymic mood, full affect Neuro- strength and  sensation are intact   EKG today demonstrates SR, 1st degree AV block Vent. rate 74 BPM PR interval 304 ms QRS duration 70 ms QT/QTcB 398/441 ms    Echo 08/05/18 - Left ventricle: The cavity size was normal. Wall thickness was   normal. Systolic function was normal. The estimated ejection   fraction was in the range of 60% to 65%. Mildly reduced GLS at   -16%. Wall motion was normal; there were no regional wall motion   abnormalities. Left ventricular diastolic function parameters   were normal. - Aortic valve: Trileaflet. Sclerosis without stenosis. There was   trivial regurgitation. - Mitral valve: Mildly thickened leaflets . There was trivial   regurgitation. - Left atrium: The atrium was mildly dilated. - Right atrium: Moderately dilated. - Tricuspid valve: There was trivial regurgitation. - Pulmonary arteries: PA peak pressure: 33 mm Hg (S). - Inferior vena cava: The vessel was dilated. The respirophasic   diameter changes were blunted (< 50%), consistent with elevated   central venous pressure.   Impressions:   - Compared to a prior study in 2015, the LVEF is higher at 60-65% -   diastolic function is normal. There is moderate RAE and mild LAE.   CHA2DS2-VASc Score = 1  The patient's score is based upon: CHF History: 0 HTN History: 1 Diabetes History: 0 Stroke History: 0 Vascular Disease History: 0 Age Score: 0 Gender Score: 0       ASSESSMENT AND PLAN: 1. Persistent Atrial Fibrillation/atrial flutter The patient's CHA2DS2-VASc score is 1, indicating a 0.6% annual risk of stroke.   S/p ablation 12/2018 Patient appears to be maintaining SR.  Continue dofetilide 500 mcg BID. QT stable. Check bmet/mag/cbc Continue carvedilol 6.25 mg BID  Continue Xarelto 20 mg daily    2. HTN Stable, no changes today.   Follow up in the AF clinic in 6 months.    Jorja Loa PA-C Afib [facility] [address] [place], [place]  [address] [phone]

## 2022-08-20 ENCOUNTER — Ambulatory Visit (INDEPENDENT_AMBULATORY_CARE_PROVIDER_SITE_OTHER): Payer: BC Managed Care – PPO

## 2022-08-20 DIAGNOSIS — I4819 Other persistent atrial fibrillation: Secondary | ICD-10-CM

## 2022-08-20 LAB — CUP PACEART REMOTE DEVICE CHECK
Date Time Interrogation Session: 20240517230517
Implantable Pulse Generator Implant Date: 20220202

## 2022-08-20 NOTE — Progress Notes (Signed)
[facility] Summary Report / Loop Recorder 

## 2022-08-29 ENCOUNTER — Other Ambulatory Visit (HOSPITAL_COMMUNITY): Payer: Self-pay | Admitting: *Deleted

## 2022-08-29 DIAGNOSIS — I4819 Other persistent atrial fibrillation: Secondary | ICD-10-CM

## 2022-08-29 MED ORDER — RIVAROXABAN 20 MG PO TABS
20.0000 mg | ORAL_TABLET | Freq: Every day | ORAL | 2 refills | Status: DC
Start: 2022-08-29 — End: 2022-11-09

## 2022-09-17 NOTE — Progress Notes (Signed)
[facility] Summary Report / Loop Recorder 

## 2022-09-20 ENCOUNTER — Ambulatory Visit: Payer: BC Managed Care – PPO

## 2022-09-20 DIAGNOSIS — I440 Atrioventricular block, first degree: Secondary | ICD-10-CM | POA: Diagnosis not present

## 2022-09-20 LAB — CUP PACEART REMOTE DEVICE CHECK
Date Time Interrogation Session: 20240619230311
Implantable Pulse Generator Implant Date: 20220202

## 2022-10-10 NOTE — Progress Notes (Signed)
[facility] Summary Report / Loop Recorder 

## 2022-10-23 ENCOUNTER — Ambulatory Visit (INDEPENDENT_AMBULATORY_CARE_PROVIDER_SITE_OTHER): Payer: BC Managed Care – PPO

## 2022-10-23 DIAGNOSIS — I440 Atrioventricular block, first degree: Secondary | ICD-10-CM | POA: Diagnosis not present

## 2022-10-24 LAB — CUP PACEART REMOTE DEVICE CHECK
Date Time Interrogation Session: 20240722230211
Implantable Pulse Generator Implant Date: 20220202

## 2022-11-08 NOTE — Progress Notes (Signed)
[facility] Summary Report / Loop Recorder 

## 2022-11-09 ENCOUNTER — Other Ambulatory Visit (HOSPITAL_COMMUNITY): Payer: Self-pay | Admitting: *Deleted

## 2022-11-09 DIAGNOSIS — I4819 Other persistent atrial fibrillation: Secondary | ICD-10-CM

## 2022-11-09 MED ORDER — RIVAROXABAN 20 MG PO TABS
20.0000 mg | ORAL_TABLET | Freq: Every day | ORAL | 2 refills | Status: DC
Start: 2022-11-09 — End: 2022-11-23

## 2022-11-23 ENCOUNTER — Other Ambulatory Visit (HOSPITAL_COMMUNITY): Payer: Self-pay

## 2022-11-23 DIAGNOSIS — I4819 Other persistent atrial fibrillation: Secondary | ICD-10-CM

## 2022-11-23 MED ORDER — RIVAROXABAN 20 MG PO TABS
20.0000 mg | ORAL_TABLET | Freq: Every day | ORAL | 0 refills | Status: DC
Start: 2022-11-23 — End: 2023-05-20

## 2022-11-25 LAB — CUP PACEART REMOTE DEVICE CHECK
Date Time Interrogation Session: 20240824230434
Implantable Pulse Generator Implant Date: 20220202

## 2022-11-26 ENCOUNTER — Ambulatory Visit (INDEPENDENT_AMBULATORY_CARE_PROVIDER_SITE_OTHER): Payer: BC Managed Care – PPO

## 2022-11-26 DIAGNOSIS — I4819 Other persistent atrial fibrillation: Secondary | ICD-10-CM | POA: Diagnosis not present

## 2022-12-04 ENCOUNTER — Other Ambulatory Visit (HOSPITAL_COMMUNITY): Payer: Self-pay | Admitting: *Deleted

## 2022-12-04 MED ORDER — DOFETILIDE 500 MCG PO CAPS: 500.0000 ug | ORAL_CAPSULE | Freq: Two times a day (BID) | ORAL | 5 refills | Status: DC

## 2022-12-05 ENCOUNTER — Other Ambulatory Visit (HOSPITAL_COMMUNITY): Payer: Self-pay | Admitting: *Deleted

## 2022-12-05 MED ORDER — DOFETILIDE 500 MCG PO CAPS: 500.0000 ug | ORAL_CAPSULE | Freq: Two times a day (BID) | ORAL | 5 refills | Status: DC

## 2022-12-05 NOTE — Progress Notes (Signed)
[facility] Summary Report / Loop Recorder 

## 2022-12-28 LAB — CUP PACEART REMOTE DEVICE CHECK
Date Time Interrogation Session: 20240926230158
Implantable Pulse Generator Implant Date: 20220202

## 2022-12-31 ENCOUNTER — Ambulatory Visit (INDEPENDENT_AMBULATORY_CARE_PROVIDER_SITE_OTHER): Payer: BC Managed Care – PPO

## 2022-12-31 DIAGNOSIS — I4819 Other persistent atrial fibrillation: Secondary | ICD-10-CM

## 2023-01-14 NOTE — Progress Notes (Signed)
[facility] Summary Report / Loop Recorder 

## 2023-01-31 LAB — CUP PACEART REMOTE DEVICE CHECK
Date Time Interrogation Session: 20241029230355
Implantable Pulse Generator Implant Date: 20220202

## 2023-02-04 ENCOUNTER — Ambulatory Visit (INDEPENDENT_AMBULATORY_CARE_PROVIDER_SITE_OTHER): Payer: BC Managed Care – PPO

## 2023-02-04 DIAGNOSIS — I4819 Other persistent atrial fibrillation: Secondary | ICD-10-CM

## 2023-02-25 NOTE — Progress Notes (Signed)
[facility] Summary Report / Loop Recorder 

## 2023-03-11 ENCOUNTER — Ambulatory Visit (INDEPENDENT_AMBULATORY_CARE_PROVIDER_SITE_OTHER): Payer: BC Managed Care – PPO

## 2023-03-11 DIAGNOSIS — I4819 Other persistent atrial fibrillation: Secondary | ICD-10-CM

## 2023-03-11 LAB — CUP PACEART REMOTE DEVICE CHECK
Date Time Interrogation Session: 20241208230437
Implantable Pulse Generator Implant Date: 20220202

## 2023-04-15 ENCOUNTER — Ambulatory Visit (INDEPENDENT_AMBULATORY_CARE_PROVIDER_SITE_OTHER): Payer: BC Managed Care – PPO

## 2023-04-15 DIAGNOSIS — I4819 Other persistent atrial fibrillation: Secondary | ICD-10-CM | POA: Diagnosis not present

## 2023-04-15 LAB — CUP PACEART REMOTE DEVICE CHECK
Date Time Interrogation Session: 20250112230452
Implantable Pulse Generator Implant Date: 20220202

## 2023-04-27 ENCOUNTER — Encounter: Payer: Self-pay | Admitting: Cardiovascular Disease

## 2023-05-03 ENCOUNTER — Ambulatory Visit (HOSPITAL_COMMUNITY): Payer: BC Managed Care – PPO | Admitting: Physician Assistant

## 2023-05-03 NOTE — Progress Notes (Incomplete)
Primary Care Physician: Assunta Found, MD Referring Physician:  Dr. 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 male with a h/o persistent afib on tikosyn therapy/xarelto. He has done well since 2016, when loaded on Tikosyn   until the last several weeks when he went back into afib/flutter. He had amiodarone in late 2015 and failed cardioversion. He had been persistent afib for a year at that time. He was seen by Dr. Johney Frame 05/2014 who initiated tikosyn.   He returned to the clinic  for f/u cardioversion 06/03/18.Marland Kitchen He had SR for about 10 mins and then returned to afib. Other options discussed and he wanted to proceed with ablation. . Is very religious to wear cpap for treatment of OSA. Struggles with weight loss. Symptomatic with fatigue and shortness of breath in afib.  F/u in afib clinic one month past ablation. He has been staying in SR. He did have bruising at the groin   that has resolved. Sore throat right after ablation that has also resolved. He continues to smoke 1 pack a day. He has gained 10 lbs.  He is trying to walk 5000 steps daily but has felt that he is still winded with exertion. He felt after ablation that he would feel better very quickly Today, I discussed Coumadin as well as novel anticoagulants including Pradaxa, Xarelto, Savaysa, and Eliquis today as indicated for risk reduction in stroke and systemic emboli with nonvalvular atrial fibrillation.  Risks, benefits, and alternatives to each of these drugs were discussed at length today. PND/orthopnea.  F/u in afib clinic, 05/05/19. He asked to be seen this am as he has not felt well for around 2-3 weeks. He has noted headaches/ extreme fatgiue/ fluttering of the heart that would only last minutes. His  symptoms have improved but linger. He  was exposed to covid 03/30/19 from his adult daughter. 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 He  did not  notice any sinus issues or loss of smell/taste. He is SR today. Weight around the same. BP elevated this am as he did not take his am meds yet. Continues on xarelto 20 mg daily for at least 1.   F/u in afib clinic, 8/23. He has not noted any afib. He remains on dofetilide after his ablation 12/09/18. He has had both covid shots. He has not noted any bleeding on xarelto for a CHA2DS2VASc score of 1. He wishes to stay on anticoagulation for family history of stroke and tikosyn, as he has stressful issues going on with the sale of a house and does not want to risk going into afib at this time.   F/u in afib clinic, 02/07/21. He remains in SR on dofetilide. Afib is staying quiet. Continues on xarelto. Has had some  intermittent nose bleeds.   F/u 08/08/21 for Tikosyn surveillance. He has not noted any afib. Complaint with Tikosyn and xarelto. Qt stable.  F/u in afib clinic, 11/0/23 for Tikosyn surveillance. He reports he has been staying in SR. 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 No issues with anticoagulation. His  K+ was checked with PCP late October and was 4.6, mag drawn today.  Follow up in the AF clinic 08/16/22. Patient reports that he has done well since  his last visit. He denies any interim symptoms of afib. No bleeding issues on anticoagulation.   Follow up in the AF clinic 05/03/23. Patient returns for follow up for ***  Today, he denies symptoms of ***palpitations, chest pain, orthopnea, PND, lower extremity edema, dizziness, presyncope, syncope, snoring, daytime somnolence, bleeding, or neurologic sequela. The patient is tolerating medications without difficulties and is otherwise without complaint today.    Past Medical History:  Diagnosis Date   Bilateral knee pain    Depression    no current meds   DJD (degenerative joint disease)    First degree heart block    GERD (gastroesophageal reflux  disease)    Hypertension    OA (osteoarthritis) of knee    bilateral   Obstructive sleep apnea    Persistent atrial fibrillation (HCC) first dx 10/ 2015-- previous primary cardiologist, dr Purvis Sheffield   followed by atrial fib. clinic-- Sebastian Ache NP    Current Outpatient Medications  Medication Sig Dispense Refill   acetaminophen (TYLENOL) 500 MG tablet Take 1,000 mg by mouth as needed for moderate pain.      amLODipine (NORVASC) 10 MG tablet TAKE 1 TABLET BY MOUTH EVERY DAY IN THE EVENING 90 tablet 3   atorvastatin (LIPITOR) 10 MG tablet Take 1 tablet by mouth daily.     carvedilol (COREG) 6.25 MG tablet Take 6.25 mg by mouth 2 (two) times daily.     dofetilide (TIKOSYN) 500 MCG capsule Take 1 capsule (500 mcg total) by mouth 2 (two) times daily. 60 capsule 5   ibuprofen (ADVIL,MOTRIN) 200 MG tablet Take 400-800 mg by mouth as needed for headache or moderate pain.      Magnesium 250 MG TABS Take 250 mg by mouth daily.      metFORMIN (GLUCOPHAGE) 500 MG tablet Take 500 mg by mouth 2 (two) times daily.     omeprazole (PRILOSEC) 40 MG capsule Take 40 mg by mouth daily.      rivaroxaban (XARELTO) 20 MG TABS tablet Take 1 tablet (20 mg total) by mouth daily with supper. 14 tablet 0   spironolactone (ALDACTONE) 50 MG tablet Take 50 mg by mouth daily.     telmisartan (MICARDIS) 80 MG tablet Take 80 mg by mouth daily.     No current facility-administered medications for this visit.    ROS- All systems are reviewed and negative except as per the HPI above  Physical Exam: There were no vitals filed for this visit.   GEN: Well nourished, well developed in no acute distress NECK: No JVD; No carotid bruits CARDIAC: {EPRHYTHM:[id number]}, no murmurs, rubs, gallops RESPIRATORY:  Clear to auscultation without rales, wheezing or rhonchi  ABDOMEN: Soft, non-tender, non-distended EXTREMITIES:  No edema; No deformity    EKG today demonstrates ***    Echo 08/05/18 - Left ventricle: The cavity  size was normal. Wall thickness was   normal. Systolic function was normal. The estimated ejection   fraction was in the range of 60% to 65%. Mildly reduced GLS at   -16%. Wall motion was normal; there were no regional wall motion   abnormalities. Left ventricular diastolic function parameters   were normal. - Aortic valve: Trileaflet. Sclerosis without stenosis. There was   trivial regurgitation. - Mitral valve: Mildly thickened leaflets . There was trivial   regurgitation. - Left atrium: The atrium was mildly dilated. - Right atrium: Moderately dilated. - Tricuspid valve: There was trivial regurgitation. - Pulmonary arteries: PA peak pressure: 33 mm Hg (S). - Inferior  vena cava: The vessel was dilated. The respirophasic   diameter changes were blunted (< 50%), consistent with elevated   central venous pressure.   Impressions:   - Compared to a prior study in 2015, the LVEF is higher at 60-65% -   diastolic function is normal. There is moderate RAE and mild LAE.   CHA2DS2-VASc Score = 1  The patient's score is based upon: CHF History: 0 HTN History: 1 Diabetes History: 0 Stroke History: 0 Vascular Disease History: 0 Age Score: 0 Gender Score: 0   {Confirm score is correct.  If not, click here to update score.  REFRESH note.  :1}    ASSESSMENT AND PLAN: Persistent Atrial Fibrillation/atrial flutter The patient's CHA2DS2-VASc score is 1, indicating a 0.6% annual risk of stroke.   S/p ablation 12/2018 *** Continue dofetilide 500 mcg BID Continue carvedilol 6.25 mg BID Continue Xarelto 20 mg daily  High Risk Medication Monitoring (ICD 10: Z79.899) QT interval on ECG appropriate for dofetilide monitoring Check bmet/mag today ***  HTN Stable on current regimen ***   Follow up ***in the AF clinic in 6 months.    Jorja Loa PA-C [facility] [address] [place], [place] [address] [phone]

## 2023-05-20 ENCOUNTER — Other Ambulatory Visit (HOSPITAL_COMMUNITY): Payer: Self-pay | Admitting: *Deleted

## 2023-05-20 ENCOUNTER — Ambulatory Visit (INDEPENDENT_AMBULATORY_CARE_PROVIDER_SITE_OTHER): Payer: BC Managed Care – PPO

## 2023-05-20 DIAGNOSIS — I4819 Other persistent atrial fibrillation: Secondary | ICD-10-CM | POA: Diagnosis not present

## 2023-05-20 MED ORDER — RIVAROXABAN 20 MG PO TABS: 20.0000 mg | ORAL_TABLET | Freq: Every day | ORAL | 1 refills | Status: DC

## 2023-05-21 LAB — CUP PACEART REMOTE DEVICE CHECK
Date Time Interrogation Session: 20250216230331
Implantable Pulse Generator Implant Date: 20220202

## 2023-05-27 NOTE — Progress Notes (Signed)
 [facility] Summary Report / Loop Recorder

## 2023-05-29 ENCOUNTER — Other Ambulatory Visit (HOSPITAL_COMMUNITY): Payer: Self-pay

## 2023-05-29 DIAGNOSIS — I4819 Other persistent atrial fibrillation: Secondary | ICD-10-CM

## 2023-05-29 MED ORDER — RIVAROXABAN 20 MG PO TABS
20.0000 mg | ORAL_TABLET | Freq: Every day | ORAL | Status: AC
Start: 2022-10-26 — End: ?

## 2023-05-31 ENCOUNTER — Ambulatory Visit (HOSPITAL_COMMUNITY)
Admission: RE | Admit: 2023-05-31 | Discharge: 2023-05-31 | Disposition: A | Discharge status: Home / Self Care | Payer: Federal, State, Local not specified - PPO | Source: Ambulatory Visit | Attending: Physician Assistant | Admitting: Physician Assistant

## 2023-05-31 VITALS — BP 120/90 | HR 81 | Ht 72.0 in | Wt 319.0 lb

## 2023-05-31 DIAGNOSIS — Z5181 Encounter for therapeutic drug level monitoring: Secondary | ICD-10-CM | POA: Diagnosis not present

## 2023-05-31 DIAGNOSIS — I4819 Other persistent atrial fibrillation: Secondary | ICD-10-CM

## 2023-05-31 DIAGNOSIS — I442 Atrioventricular block, complete: Secondary | ICD-10-CM

## 2023-05-31 DIAGNOSIS — Z7901 Long term (current) use of anticoagulants: Secondary | ICD-10-CM | POA: Insufficient documentation

## 2023-05-31 DIAGNOSIS — I10 Essential (primary) hypertension: Secondary | ICD-10-CM | POA: Diagnosis not present

## 2023-05-31 DIAGNOSIS — Z79899 Other long term (current) drug therapy: Secondary | ICD-10-CM | POA: Insufficient documentation

## 2023-05-31 DIAGNOSIS — I4892 Unspecified atrial flutter: Secondary | ICD-10-CM | POA: Diagnosis not present

## 2023-05-31 LAB — BASIC METABOLIC PANEL
Anion gap: 12 (ref 5–15)
BUN: 19 mg/dL (ref 8–23)
CO2: 23 mmol/L (ref 22–32)
Calcium: 9.1 mg/dL (ref 8.9–10.3)
Chloride: 105 mmol/L (ref 98–111)
Creatinine, Ser: 1.19 mg/dL (ref 0.61–1.24)
GFR, Estimated: >60 mL/min (ref >60)
Glucose, Bld: 106 mg/dL — ABNORMAL HIGH (ref 70–99)
Potassium: 4.8 mmol/L (ref 3.5–5.1)
Sodium: 140 mmol/L (ref 135–145)

## 2023-05-31 LAB — MAGNESIUM: Magnesium: 2.2 mg/dL (ref 1.7–2.4)

## 2023-05-31 MED ORDER — AMLODIPINE BESYLATE 10 MG PO TABS: 10.0000 mg | ORAL_TABLET | Freq: Every day | ORAL | 3 refills | Status: DC

## 2023-05-31 NOTE — Patient Instructions (Signed)
Stop coreg

## 2023-05-31 NOTE — Progress Notes (Signed)
 Primary Care Physician: Sean Found, MD Referring Physician:  Dr. 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 male with a h/o persistent afib on tikosyn therapy/xarelto. He has done well since 2016, when loaded on Tikosyn   until the last several weeks when he went back into afib/flutter. He had amiodarone in late 2015 and failed cardioversion. He had been persistent afib for a year at that time. He was seen by Dr. Johney Frame 05/2014 who initiated tikosyn.   He returned to the clinic  for f/u cardioversion 06/03/18.Marland Kitchen He had SR for about 10 mins and then returned to afib. Other options discussed and he wanted to proceed with ablation. Is very religious to wear cpap for treatment of OSA. Struggles with weight loss. Symptomatic with fatigue and shortness of breath in afib.  F/u in afib clinic one month past ablation. He has been staying in SR. He did have bruising at the groin   that has resolved. Sore throat right after ablation that has also resolved. He continues to smoke 1 pack a day. He has gained 10 lbs.  He is trying to walk 5000 steps daily but has felt that he is still winded with exertion. He felt after ablation that he would feel better very quickly Today, I discussed Coumadin as well as novel anticoagulants including Pradaxa, Xarelto, Savaysa, and Eliquis today as indicated for risk reduction in stroke and systemic emboli with nonvalvular atrial fibrillation.  Risks, benefits, and alternatives to each of these drugs were discussed at length today. PND/orthopnea.  F/u in afib clinic, 05/05/19. He asked to be seen this am as he has not felt well for around 2-3 weeks. He has noted headaches/ extreme fatgiue/ fluttering of the heart that would only last minutes. His  symptoms have improved but linger. He  was exposed to covid 03/30/19 from his adult daughter. 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 He  did not  notice any sinus issues or loss of smell/taste. He is SR today. Weight around the same. BP elevated this am as he did not take his am meds yet. Continues on xarelto 20 mg daily for at least 1.   F/u in afib clinic, 8/23. He has not noted any afib. He remains on dofetilide after his ablation 12/09/18. He has had both covid shots. He has not noted any bleeding on xarelto for a CHA2DS2VASc score of 1. He wishes to stay on anticoagulation for family history of stroke and tikosyn, as he has stressful issues going on with the sale of a house and does not want to risk going into afib at this time.   F/u in afib clinic, 02/07/21. He remains in SR on dofetilide. Afib is staying quiet. Continues on xarelto. Has had some  intermittent nose bleeds.   F/u 08/08/21 for Tikosyn surveillance. He has not noted any afib. Complaint with Tikosyn and xarelto. Qt stable.  F/u in afib clinic, 11/0/23 for Tikosyn surveillance. He reports he has been staying in SR. 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 No issues with anticoagulation. His  K+ was checked with PCP late October and was 4.6, mag drawn today.  Follow up in the AF clinic 08/16/22. Patient reports that he has done well since his  last visit. He denies any interim symptoms of afib. No bleeding issues on anticoagulation.   Follow up in the AF clinic 05/03/23. Patient returns for follow up for atrial fibrillation and dofetilide monitoring. He reports that he feels will with no presyncope or dizziness. His ILR only showed to very brief afib episodes, 0% burden. No bleeding issues on anticoagulation.   Today, he denies symptoms of palpitations, chest pain, shortness of breath, orthopnea, PND, lower extremity edema, dizziness, presyncope, syncope, snoring, daytime somnolence, bleeding, or neurologic sequela. The patient is tolerating medications without difficulties and is otherwise without  complaint today.    Past Medical History:  Diagnosis Date   Bilateral knee pain    Depression    no current meds   DJD (degenerative joint disease)    First degree heart block    GERD (gastroesophageal reflux disease)    Hypertension    OA (osteoarthritis) of knee    bilateral   Obstructive sleep apnea    Persistent atrial fibrillation (HCC) first dx 10/ 2015-- previous primary cardiologist, dr Purvis Sheffield   followed by atrial fib. clinic-- Sebastian Ache NP    Current Outpatient Medications  Medication Sig Dispense Refill   acetaminophen (TYLENOL) 500 MG tablet Take 1,000 mg by mouth as needed for moderate pain.      atorvastatin (LIPITOR) 10 MG tablet Take 1 tablet by mouth daily.     dofetilide (TIKOSYN) 500 MCG capsule Take 1 capsule (500 mcg total) by mouth 2 (two) times daily. 60 capsule 5   escitalopram (LEXAPRO) 10 MG tablet Take 10 mg by mouth daily.     ibuprofen (ADVIL,MOTRIN) 200 MG tablet Take 400-800 mg by mouth as needed for headache or moderate pain.      Magnesium 250 MG TABS Take 250 mg by mouth daily.      metFORMIN (GLUCOPHAGE) 500 MG tablet Take 500 mg by mouth 2 (two) times daily.     omeprazole (PRILOSEC) 40 MG capsule Take 40 mg by mouth daily.      rivaroxaban (XARELTO) 20 MG TABS tablet Take 1 tablet (20 mg total) by mouth daily with supper. 14 tablet    rOPINIRole (REQUIP) 1 MG tablet Take 1 mg by mouth at bedtime.     spironolactone (ALDACTONE) 50 MG tablet Take 50 mg by mouth daily.     telmisartan (MICARDIS) 80 MG tablet Take 80 mg by mouth daily.     triamcinolone cream (KENALOG) 0.1 % Apply topically 2 (two) times daily.     amLODipine (NORVASC) 10 MG tablet Take 1 tablet (10 mg total) by mouth daily. 90 tablet 3   No current facility-administered medications for this encounter.    ROS- All systems are reviewed and negative except as per the HPI above  Physical Exam: Vitals:   05/31/23 1036  BP: (!) 120/90  Pulse: 81  Weight: (!) 144.7 kg   Height: 6' (1.829 m)     GEN: Well nourished, well developed in no acute distress CARDIAC: Regular rate and rhythm, no murmurs, rubs, gallops RESPIRATORY:  Clear to auscultation without rales, wheezing or rhonchi  ABDOMEN: Soft, non-tender, non-distended EXTREMITIES:  No edema; No deformity    EKG today demonstrates CHB Vent. rate 81 BPM PR interval * ms QRS duration 72 ms QT/QTcB 382/443 ms    Echo 08/05/18 - Left ventricle: The cavity size was normal. Wall thickness was   normal. Systolic function was normal. The estimated ejection   fraction was in the range of 60%  to 65%. Mildly reduced GLS at   -16%. Wall motion was normal; there were no regional wall motion   abnormalities. Left ventricular diastolic function parameters   were normal. - Aortic valve: Trileaflet. Sclerosis without stenosis. There was   trivial regurgitation. - Mitral valve: Mildly thickened leaflets . There was trivial   regurgitation. - Left atrium: The atrium was mildly dilated. - Right atrium: Moderately dilated. - Tricuspid valve: There was trivial regurgitation. - Pulmonary arteries: PA peak pressure: 33 mm Hg (S). - Inferior vena cava: The vessel was dilated. The respirophasic   diameter changes were blunted (< 50%), consistent with elevated   central venous pressure.   Impressions:   - Compared to a prior study in 2015, the LVEF is higher at 60-65% -   diastolic function is normal. There is moderate RAE and mild LAE.   CHA2DS2-VASc Score = 1  The patient's score is based upon: CHF History: 0 HTN History: 1 Diabetes History: 0 Stroke History: 0 Vascular Disease History: 0 Age Score: 0 Gender Score: 0       ASSESSMENT AND PLAN: Persistent Atrial Fibrillation/atrial flutter The patient's CHA2DS2-VASc score is 1, indicating a 0.6% annual risk of stroke.   S/p ablation 12/2018 ILR shows 0% afib burden Continue dofetilide 500 mcg BID Continue carvedilol 6.25 mg BID Continue Xarelto  20 mg daily  High Risk Medication Monitoring (ICD 10: Z79.899) QT interval on ECG appropriate for dofetilide monitoring Check bmet/mag today  HTN Stable on current regimen  CHB Reviewed with EP, will discontinue carvedilol and recheck ECG in one week. If rhythm persists or he develops symptoms, may need PPM.    Follow up next week for repeat ECG.    Jorja Loa PA-C Afib Clinic [facility] [address] [place], [place] [address] [phone]

## 2023-06-02 ENCOUNTER — Encounter: Payer: Self-pay | Admitting: Cardiovascular Disease

## 2023-06-07 ENCOUNTER — Ambulatory Visit (HOSPITAL_COMMUNITY)
Admission: RE | Admit: 2023-06-07 | Discharge: 2023-06-07 | Disposition: A | Discharge status: Home / Self Care | Payer: Self-pay | Source: Ambulatory Visit | Attending: Physician Assistant | Admitting: Physician Assistant

## 2023-06-07 DIAGNOSIS — I440 Atrioventricular block, first degree: Secondary | ICD-10-CM | POA: Insufficient documentation

## 2023-06-07 DIAGNOSIS — I4819 Other persistent atrial fibrillation: Secondary | ICD-10-CM | POA: Insufficient documentation

## 2023-06-07 DIAGNOSIS — Z79899 Other long term (current) drug therapy: Secondary | ICD-10-CM | POA: Insufficient documentation

## 2023-06-07 DIAGNOSIS — I442 Atrioventricular block, complete: Secondary | ICD-10-CM

## 2023-06-07 NOTE — Progress Notes (Signed)
 Patient returns for ECG after discontinuing carvedilol. ECG today shows:  SR, 1st degree AV block Vent. rate 75 BPM PR interval 326 ms QRS duration 72 ms QT/QTcB 402/448 ms  Patient in SR. Would avoid AV nodal agents in the future. Will have him follow up with EP to establish care in 6 months (former Dr Johney Frame patient). AF clinic in one year.

## 2023-06-24 ENCOUNTER — Ambulatory Visit (INDEPENDENT_AMBULATORY_CARE_PROVIDER_SITE_OTHER): Payer: BC Managed Care – PPO

## 2023-06-24 DIAGNOSIS — I4819 Other persistent atrial fibrillation: Secondary | ICD-10-CM | POA: Diagnosis not present

## 2023-06-24 LAB — CUP PACEART REMOTE DEVICE CHECK
Date Time Interrogation Session: 20250324054032
Implantable Pulse Generator Implant Date: 20220202

## 2023-06-26 NOTE — Addendum Note (Signed)
 Addended by: Geralyn Flash D on: 06/26/2023 02:46 PM   Modules accepted: Orders

## 2023-06-26 NOTE — Progress Notes (Signed)
 [facility] Summary Report / Loop Recorder

## 2023-07-04 ENCOUNTER — Encounter: Payer: Self-pay | Admitting: Cardiovascular Disease

## 2023-07-29 ENCOUNTER — Ambulatory Visit (INDEPENDENT_AMBULATORY_CARE_PROVIDER_SITE_OTHER): Payer: BC Managed Care – PPO

## 2023-07-29 DIAGNOSIS — I4819 Other persistent atrial fibrillation: Secondary | ICD-10-CM | POA: Diagnosis not present

## 2023-07-29 LAB — CUP PACEART REMOTE DEVICE CHECK
Date Time Interrogation Session: 20250427230515
Implantable Pulse Generator Implant Date: 20220202

## 2023-08-02 ENCOUNTER — Encounter: Payer: Self-pay | Admitting: Cardiovascular Disease

## 2023-08-09 NOTE — Progress Notes (Signed)
 [facility] Summary Report / Loop Recorder

## 2023-09-02 ENCOUNTER — Ambulatory Visit (INDEPENDENT_AMBULATORY_CARE_PROVIDER_SITE_OTHER): Payer: Self-pay

## 2023-09-02 DIAGNOSIS — I4819 Other persistent atrial fibrillation: Secondary | ICD-10-CM | POA: Diagnosis not present

## 2023-09-02 LAB — CUP PACEART REMOTE DEVICE CHECK
Date Time Interrogation Session: 20250601231650
Implantable Pulse Generator Implant Date: 20220202

## 2023-09-04 ENCOUNTER — Ambulatory Visit: Payer: Self-pay | Admitting: Cardiovascular Disease

## 2023-09-16 ENCOUNTER — Other Ambulatory Visit (HOSPITAL_COMMUNITY): Payer: Self-pay | Admitting: Physician Assistant

## 2023-09-16 DIAGNOSIS — I4819 Other persistent atrial fibrillation: Secondary | ICD-10-CM

## 2023-09-17 NOTE — Progress Notes (Signed)
 [facility] Summary Report / Loop Recorder

## 2023-10-03 ENCOUNTER — Ambulatory Visit: Payer: Self-pay

## 2023-10-03 DIAGNOSIS — I442 Atrioventricular block, complete: Secondary | ICD-10-CM | POA: Diagnosis not present

## 2023-10-03 LAB — CUP PACEART REMOTE DEVICE CHECK
Date Time Interrogation Session: 20250702231208
Implantable Pulse Generator Implant Date: 20220202

## 2023-10-08 ENCOUNTER — Ambulatory Visit: Payer: Self-pay | Admitting: Cardiovascular Disease

## 2023-10-22 NOTE — Progress Notes (Signed)
 [facility] Summary Report / Loop Recorder

## 2023-11-04 ENCOUNTER — Ambulatory Visit: Payer: Self-pay

## 2023-11-04 DIAGNOSIS — I4819 Other persistent atrial fibrillation: Secondary | ICD-10-CM | POA: Diagnosis not present

## 2023-11-05 LAB — CUP PACEART REMOTE DEVICE CHECK
Date Time Interrogation Session: 20250802230302
Implantable Pulse Generator Implant Date: 20220202

## 2023-11-12 ENCOUNTER — Ambulatory Visit: Payer: Self-pay | Admitting: Cardiovascular Disease

## 2023-12-05 ENCOUNTER — Encounter: Payer: Self-pay | Admitting: Cardiology

## 2023-12-05 ENCOUNTER — Ambulatory Visit: Payer: Self-pay | Admitting: Cardiovascular Disease

## 2023-12-05 ENCOUNTER — Ambulatory Visit (INDEPENDENT_AMBULATORY_CARE_PROVIDER_SITE_OTHER): Payer: Self-pay

## 2023-12-05 DIAGNOSIS — I4819 Other persistent atrial fibrillation: Secondary | ICD-10-CM | POA: Diagnosis not present

## 2023-12-05 LAB — CUP PACEART REMOTE DEVICE CHECK
Date Time Interrogation Session: 20250903230857
Implantable Pulse Generator Implant Date: 20220202

## 2023-12-12 ENCOUNTER — Other Ambulatory Visit (HOSPITAL_COMMUNITY): Payer: Self-pay | Admitting: Physician Assistant

## 2023-12-14 NOTE — Progress Notes (Signed)
 Remote Loop Recorder Transmission

## 2023-12-30 NOTE — Progress Notes (Signed)
 Remote Loop Recorder Transmission

## 2024-01-07 NOTE — Progress Notes (Signed)
 [facility] Summary Report / Loop Recorder

## 2024-01-08 ENCOUNTER — Ambulatory Visit (INDEPENDENT_AMBULATORY_CARE_PROVIDER_SITE_OTHER): Payer: Self-pay

## 2024-01-08 DIAGNOSIS — I4819 Other persistent atrial fibrillation: Secondary | ICD-10-CM | POA: Diagnosis not present

## 2024-01-09 LAB — CUP PACEART REMOTE DEVICE CHECK
Date Time Interrogation Session: 20251007230259
Implantable Pulse Generator Implant Date: 20220202

## 2024-01-13 ENCOUNTER — Ambulatory Visit: Payer: Self-pay | Admitting: Cardiovascular Disease

## 2024-01-13 NOTE — Progress Notes (Signed)
 Remote Loop Recorder Transmission

## 2024-02-08 ENCOUNTER — Ambulatory Visit (INDEPENDENT_AMBULATORY_CARE_PROVIDER_SITE_OTHER): Payer: Self-pay

## 2024-02-08 DIAGNOSIS — I4819 Other persistent atrial fibrillation: Secondary | ICD-10-CM | POA: Diagnosis not present

## 2024-02-09 LAB — CUP PACEART REMOTE DEVICE CHECK
Date Time Interrogation Session: 20251107230654
Implantable Pulse Generator Implant Date: 20220202

## 2024-02-12 ENCOUNTER — Ambulatory Visit: Payer: Self-pay | Admitting: Cardiovascular Disease

## 2024-02-12 NOTE — Progress Notes (Signed)
 Remote Loop Recorder Transmission

## 2024-03-10 ENCOUNTER — Ambulatory Visit (INDEPENDENT_AMBULATORY_CARE_PROVIDER_SITE_OTHER): Payer: Self-pay

## 2024-03-11 LAB — CUP PACEART REMOTE DEVICE CHECK
Date Time Interrogation Session: 20251208230702
Implantable Pulse Generator Implant Date: 20220202

## 2024-03-11 NOTE — Progress Notes (Signed)
 Remote Loop Recorder Transmission

## 2024-03-20 ENCOUNTER — Ambulatory Visit: Payer: Self-pay | Admitting: Cardiovascular Disease

## 2024-04-10 ENCOUNTER — Ambulatory Visit: Payer: Self-pay

## 2024-04-10 DIAGNOSIS — I4819 Other persistent atrial fibrillation: Secondary | ICD-10-CM | POA: Diagnosis not present

## 2024-04-12 LAB — CUP PACEART REMOTE DEVICE CHECK
Date Time Interrogation Session: 20260108230612
Implantable Pulse Generator Implant Date: 20220202

## 2024-04-14 NOTE — Progress Notes (Signed)
 Remote Loop Recorder Transmission

## 2024-04-17 ENCOUNTER — Ambulatory Visit: Payer: Self-pay | Admitting: Cardiovascular Disease

## 2024-05-03 ENCOUNTER — Other Ambulatory Visit (HOSPITAL_COMMUNITY): Payer: Self-pay | Admitting: Physician Assistant
